# Patient Record
Sex: Female | Born: 1955 | Race: Black or African American | Hispanic: No | Smoking: Former smoker
Health system: Southern US, Community
[De-identification: ages and names within clinical notes are randomized; demographics above are authoritative.]

## PROBLEM LIST (undated history)

## (undated) DIAGNOSIS — E785 Hyperlipidemia, unspecified: Secondary | ICD-10-CM

## (undated) DIAGNOSIS — G4730 Sleep apnea, unspecified: Secondary | ICD-10-CM

## (undated) DIAGNOSIS — I252 Old myocardial infarction: Secondary | ICD-10-CM

## (undated) DIAGNOSIS — E669 Obesity, unspecified: Secondary | ICD-10-CM

## (undated) DIAGNOSIS — I219 Acute myocardial infarction, unspecified: Secondary | ICD-10-CM

## (undated) DIAGNOSIS — E559 Vitamin D deficiency, unspecified: Secondary | ICD-10-CM

## (undated) DIAGNOSIS — E8940 Asymptomatic postprocedural ovarian failure: Secondary | ICD-10-CM

## (undated) DIAGNOSIS — M1990 Unspecified osteoarthritis, unspecified site: Secondary | ICD-10-CM

## (undated) DIAGNOSIS — I2510 Atherosclerotic heart disease of native coronary artery without angina pectoris: Secondary | ICD-10-CM

## (undated) DIAGNOSIS — I10 Essential (primary) hypertension: Secondary | ICD-10-CM

## (undated) DIAGNOSIS — M549 Dorsalgia, unspecified: Secondary | ICD-10-CM

## (undated) DIAGNOSIS — E119 Type 2 diabetes mellitus without complications: Secondary | ICD-10-CM

## (undated) HISTORY — DX: Old myocardial infarction: I25.2

## (undated) HISTORY — DX: Vitamin D deficiency, unspecified: E55.9

## (undated) HISTORY — DX: Sleep apnea, unspecified: G47.30

## (undated) HISTORY — DX: Type 2 diabetes mellitus without complications: E11.9

## (undated) HISTORY — DX: Atherosclerotic heart disease of native coronary artery without angina pectoris: I25.10

## (undated) HISTORY — DX: Dorsalgia, unspecified: M54.9

## (undated) HISTORY — DX: Asymptomatic postprocedural ovarian failure: E89.40

## (undated) HISTORY — PX: ABDOMINAL HYSTERECTOMY: SHX81

## (undated) HISTORY — DX: Obesity, unspecified: E66.9

## (undated) HISTORY — DX: Hyperlipidemia, unspecified: E78.5

## (undated) HISTORY — PX: COLONOSCOPY: SHX174

---

## 2000-10-29 HISTORY — PX: CORONARY ANGIOPLASTY: SHX604

## 2005-06-07 ENCOUNTER — Ambulatory Visit: Payer: Self-pay | Admitting: Unknown Physician Specialty

## 2005-06-14 ENCOUNTER — Ambulatory Visit: Payer: Self-pay

## 2006-04-25 ENCOUNTER — Ambulatory Visit: Payer: Self-pay | Admitting: Family Medicine

## 2007-04-28 ENCOUNTER — Ambulatory Visit: Payer: Self-pay | Admitting: Family Medicine

## 2008-05-20 ENCOUNTER — Ambulatory Visit: Payer: Self-pay | Admitting: Family Medicine

## 2009-01-27 LAB — HM DEXA SCAN: HM Dexa Scan: NORMAL

## 2009-05-31 ENCOUNTER — Ambulatory Visit: Payer: Self-pay | Admitting: Family Medicine

## 2010-04-10 ENCOUNTER — Ambulatory Visit: Payer: Self-pay | Admitting: Family Medicine

## 2010-08-19 ENCOUNTER — Emergency Department: Payer: Self-pay | Admitting: Emergency Medicine

## 2012-10-30 ENCOUNTER — Ambulatory Visit: Payer: Self-pay | Admitting: Family Medicine

## 2013-01-23 ENCOUNTER — Ambulatory Visit: Payer: Self-pay | Admitting: Family Medicine

## 2013-01-23 DIAGNOSIS — G478 Other sleep disorders: Secondary | ICD-10-CM | POA: Insufficient documentation

## 2013-06-10 LAB — LIPID PANEL
CHOLESTEROL: 140 mg/dL (ref 0–200)
HDL: 66 mg/dL (ref 35–70)
LDL Cholesterol: 45 mg/dL
Triglycerides: 146 mg/dL (ref 40–160)

## 2013-12-02 ENCOUNTER — Ambulatory Visit: Payer: Self-pay | Admitting: Gastroenterology

## 2013-12-22 LAB — HM COLONOSCOPY: HM Colonoscopy: NORMAL

## 2014-04-21 ENCOUNTER — Ambulatory Visit: Payer: Self-pay | Admitting: Family Medicine

## 2014-04-21 LAB — HM MAMMOGRAPHY: HM Mammogram: NORMAL

## 2014-05-13 DIAGNOSIS — R001 Bradycardia, unspecified: Secondary | ICD-10-CM | POA: Insufficient documentation

## 2014-05-13 DIAGNOSIS — I10 Essential (primary) hypertension: Secondary | ICD-10-CM | POA: Insufficient documentation

## 2014-05-13 DIAGNOSIS — I2510 Atherosclerotic heart disease of native coronary artery without angina pectoris: Secondary | ICD-10-CM | POA: Insufficient documentation

## 2014-12-30 LAB — HEMOGLOBIN A1C: HEMOGLOBIN A1C: 6.4 % — AB (ref 4.0–6.0)

## 2015-02-05 ENCOUNTER — Emergency Department
Admit: 2015-02-05 | Disposition: A | Discharge status: Home / Self Care | Payer: Self-pay | Admitting: Emergency Medicine

## 2015-02-05 LAB — URINALYSIS, COMPLETE
Bacteria: NONE SEEN
Bilirubin,UR: NEGATIVE
Blood: NEGATIVE
Glucose,UR: NEGATIVE mg/dL (ref 0–75)
Ketone: NEGATIVE
LEUKOCYTE ESTERASE: NEGATIVE
Nitrite: NEGATIVE
PH: 6 (ref 4.5–8.0)
Protein: NEGATIVE
Specific Gravity: 1.012 (ref 1.003–1.030)

## 2015-02-05 LAB — COMPREHENSIVE METABOLIC PANEL
ALBUMIN: 4.4 g/dL
AST: 20 U/L
Alkaline Phosphatase: 70 U/L
Anion Gap: 8 (ref 7–16)
BUN: 19 mg/dL
Bilirubin,Total: 0.4 mg/dL
CALCIUM: 9.3 mg/dL
CO2: 26 mmol/L
Chloride: 106 mmol/L
Creatinine: 1.04 mg/dL — ABNORMAL HIGH
EGFR (African American): >60
GFR CALC NON AF AMER: 58 — AB
GLUCOSE: 140 mg/dL — AB
Potassium: 3.9 mmol/L
SGPT (ALT): 15 U/L
SODIUM: 140 mmol/L
Total Protein: 7.8 g/dL

## 2015-02-05 LAB — CBC WITH DIFFERENTIAL/PLATELET
BASOS PCT: 0.5 %
Basophil #: 0.1 10*3/uL (ref 0.0–0.1)
EOS PCT: 0.3 %
Eosinophil #: 0 10*3/uL (ref 0.0–0.7)
HCT: 38.2 % (ref 35.0–47.0)
HGB: 12.2 g/dL (ref 12.0–16.0)
LYMPHS PCT: 13.2 %
Lymphocyte #: 1.7 10*3/uL (ref 1.0–3.6)
MCH: 26.5 pg (ref 26.0–34.0)
MCHC: 32 g/dL (ref 32.0–36.0)
MCV: 83 fL (ref 80–100)
MONO ABS: 0.3 x10 3/mm (ref 0.2–0.9)
Monocyte %: 2.5 %
NEUTROS PCT: 83.5 %
Neutrophil #: 10.7 10*3/uL — ABNORMAL HIGH (ref 1.4–6.5)
PLATELETS: 249 10*3/uL (ref 150–440)
RBC: 4.62 10*6/uL (ref 3.80–5.20)
RDW: 14.3 % (ref 11.5–14.5)
WBC: 12.8 10*3/uL — ABNORMAL HIGH (ref 3.6–11.0)

## 2015-02-05 LAB — TROPONIN I: Troponin-I: <0.03 ng/mL

## 2015-07-12 ENCOUNTER — Other Ambulatory Visit: Payer: Self-pay | Admitting: Family Medicine

## 2015-07-12 DIAGNOSIS — Z1231 Encounter for screening mammogram for malignant neoplasm of breast: Secondary | ICD-10-CM

## 2015-07-19 ENCOUNTER — Ambulatory Visit
Admission: RE | Admit: 2015-07-19 | Discharge: 2015-07-19 | Disposition: A | Discharge status: Home / Self Care | Payer: PRIVATE HEALTH INSURANCE | Source: Ambulatory Visit | Attending: Family Medicine | Admitting: Family Medicine

## 2015-07-19 DIAGNOSIS — Z1231 Encounter for screening mammogram for malignant neoplasm of breast: Secondary | ICD-10-CM | POA: Insufficient documentation

## 2015-09-07 ENCOUNTER — Ambulatory Visit (INDEPENDENT_AMBULATORY_CARE_PROVIDER_SITE_OTHER): Payer: PRIVATE HEALTH INSURANCE | Admitting: Family Medicine

## 2015-09-07 ENCOUNTER — Encounter: Payer: Self-pay | Admitting: Family Medicine

## 2015-09-07 VITALS — BP 132/86 | HR 98 | Temp 98.7°F | Resp 14 | Ht 62.0 in | Wt 241.3 lb

## 2015-09-07 DIAGNOSIS — Z79899 Other long term (current) drug therapy: Secondary | ICD-10-CM

## 2015-09-07 DIAGNOSIS — I252 Old myocardial infarction: Secondary | ICD-10-CM | POA: Insufficient documentation

## 2015-09-07 DIAGNOSIS — Z90710 Acquired absence of both cervix and uterus: Secondary | ICD-10-CM | POA: Insufficient documentation

## 2015-09-07 DIAGNOSIS — E559 Vitamin D deficiency, unspecified: Secondary | ICD-10-CM

## 2015-09-07 DIAGNOSIS — Z9861 Coronary angioplasty status: Secondary | ICD-10-CM | POA: Diagnosis not present

## 2015-09-07 DIAGNOSIS — Z01419 Encounter for gynecological examination (general) (routine) without abnormal findings: Secondary | ICD-10-CM

## 2015-09-07 DIAGNOSIS — Z0000 Encounter for general adult medical examination without abnormal findings: Secondary | ICD-10-CM | POA: Diagnosis not present

## 2015-09-07 DIAGNOSIS — E669 Obesity, unspecified: Secondary | ICD-10-CM | POA: Diagnosis not present

## 2015-09-07 DIAGNOSIS — I2510 Atherosclerotic heart disease of native coronary artery without angina pectoris: Secondary | ICD-10-CM | POA: Diagnosis not present

## 2015-09-07 DIAGNOSIS — G478 Other sleep disorders: Secondary | ICD-10-CM | POA: Diagnosis not present

## 2015-09-07 DIAGNOSIS — E785 Hyperlipidemia, unspecified: Secondary | ICD-10-CM | POA: Insufficient documentation

## 2015-09-07 DIAGNOSIS — E1169 Type 2 diabetes mellitus with other specified complication: Secondary | ICD-10-CM | POA: Insufficient documentation

## 2015-09-07 DIAGNOSIS — E119 Type 2 diabetes mellitus without complications: Secondary | ICD-10-CM

## 2015-09-07 DIAGNOSIS — Z23 Encounter for immunization: Secondary | ICD-10-CM | POA: Diagnosis not present

## 2015-09-07 MED ORDER — METFORMIN HCL 500 MG PO TABS: 500.0000 mg | ORAL_TABLET | Freq: Every day | ORAL | Status: DC

## 2015-09-07 MED ORDER — LISINOPRIL 10 MG PO TABS: 10.0000 mg | ORAL_TABLET | Freq: Every day | ORAL | Status: DC

## 2015-09-07 MED ORDER — SIMVASTATIN 40 MG PO TABS: 40.0000 mg | ORAL_TABLET | Freq: Every day | ORAL | Status: DC

## 2015-09-07 NOTE — Progress Notes (Signed)
Name: Kathy BarriosShelva L Price   MRN: [id number]    DOB: [birth year]   Date:09/07/2015       Progress Note  Subjective  Chief Complaint  Chief Complaint  Patient presents with  . Annual Exam    HPI  Well woman : married, s/p hysterectomy for benign disease, no vaginal dryness, no dyspareunia, no incontinence.   DMII: last visit was 12/2014, she has been taking Metformin, no side effects of medication. Fsbs has been around 112-123, she has occasional polyphagia and polydipsia and polyuria. Not up to date with eye exam, but she will schedule it.  CAD: s/p MI and stent placement 08/2001, she needs to follow up with Dr. Gwen PoundsKowalski. She has been taking apirin 81 mg daily also lisinopril. Not on beta-blocker, she had an episode of bradycardia in the past. No recent chest pain. States has occasionally a sharp pain for about one minute that resolves by itself. Not associated with SOB or diaphoresis.  Morbid Obesity: weight has been stable, she states that she has been working long hours, but is trying to incorporate some walking and do some home exercises. She likes sweets and has been trying to cut down. Eating more vegetables and baked food.  Hyperlipidemia: taking Simvastatin , denies side effects  UARS: found on Sleep Study, she did not get referral to ENT  Patient Active Problem List   Diagnosis Date Noted  . CAD S/P percutaneous coronary angioplasty 09/07/2015  . Diabetes mellitus type 2 in obese (HCC) 09/07/2015  . Dyslipidemia 09/07/2015  . H/O: hysterectomy 09/07/2015  . H/O acute myocardial infarction 09/07/2015  . Vitamin D deficiency 09/07/2015  . Morbid obesity (HCC) 09/07/2015  . Bradycardia 05/13/2014  . Arteriosclerosis of coronary artery 05/13/2014  . Essential (primary) hypertension 05/13/2014  . UARS (upper airway resistance syndrome) 01/23/2013    Past Surgical History  Procedure Laterality Date  . Abdominal hysterectomy      Family History  Problem Relation Age of Onset   . Breast cancer Sister 2950  . Diabetes Mother   . Hypertension Mother   . Cancer Father   . CAD Brother     Social History   Social History  . Marital Status: Married    Spouse Name: N/A  . Number of Children: N/A  . Years of Education: N/A   Occupational History  . Not on file.   Social History Main Topics  . Smoking status: Former Smoker -- 1.00 packs/day for 25 years    Types: Cigarettes    Quit date: 09/06/2000  . Smokeless tobacco: Not on file  . Alcohol Use: 0.0 oz/week    0 Standard drinks or equivalent per week  . Drug Use: No  . Sexual Activity: Yes   Other Topics Concern  . Not on file   Social History Narrative     Current outpatient prescriptions:  .  Cholecalciferol (VITAMIN D3) 2000 UNITS capsule, Take 1 capsule by mouth daily., Disp: , Rfl:  .  metFORMIN (GLUCOPHAGE) 500 MG tablet, Take 1 tablet (500 mg total) by mouth daily., Disp: 30 tablet, Rfl: 5 .  simvastatin (ZOCOR) 40 MG tablet, Take 1 tablet (40 mg total) by mouth daily., Disp: 30 tablet, Rfl: 5 .  aspirin EC 81 MG tablet, Take 1 tablet by mouth daily., Disp: , Rfl:  .  lisinopril (PRINIVIL,ZESTRIL) 10 MG tablet, Take 1 tablet (10 mg total) by mouth daily., Disp: 30 tablet, Rfl: 5  Allergies  Allergen Reactions  . Atorvastatin   .  Metoprolol     severe bradycardia  . Sulfa Antibiotics Rash and Hives     ROS  Constitutional: Negative for fever or weight change.  Respiratory: Negative for cough and shortness of breath.   Cardiovascular: Negative for chest pain or palpitations.  Gastrointestinal: Negative for abdominal pain, no bowel changes.  Musculoskeletal: Negative for gait problem or joint swelling.  Skin: Negative for rash.  Neurological: Negative for dizziness or headache.  No other specific complaints in a complete review of systems (except as listed in HPI above).  Objective  Filed Vitals:   09/07/15 0857  BP: 132/86  Pulse: 98  Temp: 98.7 F (37.1 C)  TempSrc: Oral   Resp: 14  Height:  (1.575 m)  Weight: 241 lb 4.8 oz (109.453 kg)  SpO2: 99%    Body mass index is 44.12 kg/(m^2).  Physical Exam   Constitutional: Patient appears well-developed and obese. No distress.  HENT: Head: Normocephalic and atraumatic. Ears: B TMs ok, no erythema or effusion; Nose: Nose normal. Mouth/Throat: Oropharynx is clear and moist. No oropharyngeal exudate.  Eyes: Conjunctivae and EOM are normal. Pupils are equal, round, and reactive to light. No scleral icterus.  Neck: Normal range of motion. Neck supple. No JVD present. No thyromegaly present.  Cardiovascular: Normal rate, regular rhythm and normal heart sounds.  No murmur heard. No BLE edema. Pulmonary/Chest: Effort normal and breath sounds normal. No respiratory distress. Abdominal: Soft. Bowel sounds are normal, no distension. There is no tenderness. no masses Breast: no lumps or masses, no nipple discharge or rashes FEMALE GENITALIA:  External genitalia normal External urethra normal Vaginal vault normal without discharge or lesions Cervix absent s/p hysterectomy Bimanual exam normal without masses RECTAL: normal external exam Musculoskeletal: Normal range of motion, no joint effusions. No gross deformities Neurological: he is alert and oriented to person, place, and time. No cranial nerve deficit. Coordination, balance, strength, speech and gait are normal.  Skin: Skin is warm and dry. No rash noted. No erythema.  Psychiatric: Patient has a normal mood and affect. behavior is normal. Judgment and thought content normal.  Diabetic Foot Exam: Diabetic Foot Exam - Simple   Simple Foot Form  Diabetic Foot exam was performed with the following findings:  Yes 09/07/2015  9:13 AM  Visual Inspection  No deformities, no ulcerations, no other skin breakdown bilaterally:  Yes  Sensation Testing  Intact to touch and monofilament testing bilaterally:  Yes  Pulse Check  Posterior Tibialis and Dorsalis pulse  intact bilaterally:  Yes  Comments       PHQ2/9: Depression screen PHQ 2/9 09/07/2015  Decreased Interest 0  Down, Depressed, Hopeless 0  PHQ - 2 Score 0     Fall Risk: Fall Risk  09/07/2015  Falls in the past year? No     Functional Status Survey: Is the patient deaf or have difficulty hearing?: No Does the patient have difficulty seeing, even when wearing glasses/contacts?: No Does the patient have difficulty concentrating, remembering, or making decisions?: No Does the patient have difficulty walking or climbing stairs?: No Does the patient have difficulty dressing or bathing?: No Does the patient have difficulty doing errands alone such as visiting a doctor's office or shopping?: No    Assessment & Plan  1. Well woman exam  Discussed importance of 150 minutes of physical activity weekly, eat two servings of fish weekly, eat one serving of tree nuts ( cashews, pistachios, pecans, almonds.Marland Kitchen) every other day, eat 6 servings of fruit/vegetables daily and  drink plenty of water and avoid sweet beverages.   2. Morbid obesity due to excess calories [facility])  Discussed with the patient the risk posed by an increased BMI. Discussed importance of portion control, calorie counting and at least 150 minutes of physical activity weekly. Avoid sweet beverages and drink more water. Eat at least 6 servings of fruit and vegetables daily   3. Vitamin D deficiency  - Vit D  25 hydroxy (rtn osteoporosis monitoring)  4. Dyslipidemia  - Lipid panel - simvastatin (ZOCOR) 40 MG tablet; Take 1 tablet (40 mg total) by mouth daily.  Dispense: 30 tablet; Refill: 5  5. Diabetes mellitus type 2 in obese [facility])  Explained importance of regular follow ups - Hemoglobin A1c - metFORMIN (GLUCOPHAGE) 500 MG tablet; Take 1 tablet (500 mg total) by mouth daily.  Dispense: 30 tablet; Refill: 5 - lisinopril (PRINIVIL,ZESTRIL) 10 MG tablet; Take 1 tablet (10 mg total) by mouth daily.  Dispense: 30 tablet;  Refill: 5  6. UARS (upper airway resistance syndrome)  - Ambulatory referral to ENT  7. Need for shingles vaccine  - Varicella-zoster vaccine subcutaneous - refused she will check coverage with insurance  8. Need for pneumococcal vaccination  - Pneumococcal polysaccharide vaccine 23-valent greater than or equal to 2yo subcutaneous/IM  9. Long-term use of high-risk medication  - Comprehensive metabolic panel  10. CAD S/P percutaneous coronary angioplasty  - lisinopril (PRINIVIL,ZESTRIL) 10 MG tablet; Take 1 tablet (10 mg total) by mouth daily.  Dispense: 30 tablet; Refill: 5

## 2015-09-09 ENCOUNTER — Other Ambulatory Visit
Admission: RE | Admit: 2015-09-09 | Discharge: 2015-09-09 | Disposition: A | Discharge status: Home / Self Care | Payer: PRIVATE HEALTH INSURANCE | Source: Ambulatory Visit | Attending: Family Medicine | Admitting: Family Medicine

## 2015-09-09 DIAGNOSIS — E785 Hyperlipidemia, unspecified: Secondary | ICD-10-CM | POA: Diagnosis not present

## 2015-09-09 DIAGNOSIS — Z79899 Other long term (current) drug therapy: Secondary | ICD-10-CM | POA: Diagnosis not present

## 2015-09-09 DIAGNOSIS — E119 Type 2 diabetes mellitus without complications: Secondary | ICD-10-CM | POA: Diagnosis not present

## 2015-09-09 DIAGNOSIS — E669 Obesity, unspecified: Secondary | ICD-10-CM | POA: Diagnosis not present

## 2015-09-09 DIAGNOSIS — E559 Vitamin D deficiency, unspecified: Secondary | ICD-10-CM | POA: Diagnosis not present

## 2015-09-09 LAB — HEMOGLOBIN A1C: Hgb A1c MFr Bld: 6 % (ref 4.0–6.0)

## 2015-09-09 LAB — COMPREHENSIVE METABOLIC PANEL
ALT: 13 U/L — ABNORMAL LOW (ref 14–54)
AST: 17 U/L (ref 15–41)
Albumin: 4.2 g/dL (ref 3.5–5.0)
Alkaline Phosphatase: 74 U/L (ref 38–126)
Anion gap: 6 (ref 5–15)
BUN: 16 mg/dL (ref 6–20)
CO2: 27 mmol/L (ref 22–32)
Calcium: 9.6 mg/dL (ref 8.9–10.3)
Chloride: 107 mmol/L (ref 101–111)
Creatinine, Ser: 0.86 mg/dL (ref 0.44–1.00)
GFR calc Af Amer: >60 mL/min (ref >60)
Glucose, Bld: 107 mg/dL — ABNORMAL HIGH (ref 65–99)
POTASSIUM: 4.2 mmol/L (ref 3.5–5.1)
Sodium: 140 mmol/L (ref 135–145)
Total Bilirubin: 0.3 mg/dL (ref 0.3–1.2)
Total Protein: 8 g/dL (ref 6.5–8.1)

## 2015-09-09 LAB — LIPID PANEL
Cholesterol: 156 mg/dL (ref 0–200)
HDL: 50 mg/dL (ref >40)
LDL CALC: 73 mg/dL (ref 0–99)
TRIGLYCERIDES: 167 mg/dL — AB (ref <150)
Total CHOL/HDL Ratio: 3.1 RATIO
VLDL: 33 mg/dL (ref 0–40)

## 2015-09-10 LAB — VITAMIN D 25 HYDROXY (VIT D DEFICIENCY, FRACTURES): VIT D 25 HYDROXY: 13.6 ng/mL — AB (ref 30.0–100.0)

## 2015-09-11 ENCOUNTER — Other Ambulatory Visit: Payer: Self-pay | Admitting: Family Medicine

## 2015-09-11 MED ORDER — VITAMIN D (ERGOCALCIFEROL) 1.25 MG (50000 UNIT) PO CAPS: 50000.0000 [IU] | ORAL_CAPSULE | ORAL | Status: DC

## 2016-01-30 DIAGNOSIS — I255 Ischemic cardiomyopathy: Secondary | ICD-10-CM | POA: Insufficient documentation

## 2016-01-30 DIAGNOSIS — I420 Dilated cardiomyopathy: Secondary | ICD-10-CM | POA: Insufficient documentation

## 2016-03-07 ENCOUNTER — Ambulatory Visit (INDEPENDENT_AMBULATORY_CARE_PROVIDER_SITE_OTHER): Payer: BLUE CROSS/BLUE SHIELD | Admitting: Family Medicine

## 2016-03-07 ENCOUNTER — Encounter: Payer: Self-pay | Admitting: Family Medicine

## 2016-03-07 VITALS — BP 122/78 | HR 83 | Temp 98.6°F | Resp 16 | Wt 242.8 lb

## 2016-03-07 DIAGNOSIS — Z23 Encounter for immunization: Secondary | ICD-10-CM | POA: Diagnosis not present

## 2016-03-07 DIAGNOSIS — E559 Vitamin D deficiency, unspecified: Secondary | ICD-10-CM

## 2016-03-07 DIAGNOSIS — E785 Hyperlipidemia, unspecified: Secondary | ICD-10-CM | POA: Diagnosis not present

## 2016-03-07 DIAGNOSIS — I2510 Atherosclerotic heart disease of native coronary artery without angina pectoris: Secondary | ICD-10-CM

## 2016-03-07 DIAGNOSIS — E669 Obesity, unspecified: Secondary | ICD-10-CM | POA: Diagnosis not present

## 2016-03-07 DIAGNOSIS — I10 Essential (primary) hypertension: Secondary | ICD-10-CM | POA: Diagnosis not present

## 2016-03-07 DIAGNOSIS — Z9861 Coronary angioplasty status: Secondary | ICD-10-CM

## 2016-03-07 DIAGNOSIS — E1169 Type 2 diabetes mellitus with other specified complication: Secondary | ICD-10-CM

## 2016-03-07 DIAGNOSIS — G478 Other sleep disorders: Secondary | ICD-10-CM | POA: Diagnosis not present

## 2016-03-07 DIAGNOSIS — E119 Type 2 diabetes mellitus without complications: Secondary | ICD-10-CM

## 2016-03-07 LAB — POCT UA - MICROALBUMIN: MICROALBUMIN (UR) POC: 20 mg/L

## 2016-03-07 LAB — POCT GLYCOSYLATED HEMOGLOBIN (HGB A1C): Hemoglobin A1C: 6.7

## 2016-03-07 MED ORDER — LISINOPRIL 10 MG PO TABS: 10.0000 mg | ORAL_TABLET | Freq: Every day | ORAL | Status: DC

## 2016-03-07 MED ORDER — SIMVASTATIN 40 MG PO TABS: 40.0000 mg | ORAL_TABLET | Freq: Every day | ORAL | Status: DC

## 2016-03-07 MED ORDER — FLUCONAZOLE 150 MG PO TABS: 150.0000 mg | ORAL_TABLET | ORAL | Status: DC

## 2016-03-07 MED ORDER — DAPAGLIFLOZIN PRO-METFORMIN ER 5-500 MG PO TB24: 1.0000 | ORAL_TABLET | Freq: Every day | ORAL | Status: DC

## 2016-03-07 NOTE — Progress Notes (Signed)
Name: Kathy BarriosShelva L Price   MRN: [id number]    DOB: 1956-10-07   Date:03/07/2016       Progress Note  Subjective  Chief Complaint  Chief Complaint  Patient presents with  . Follow-up    patient is here for her 6129-month f/u    HPI  DMII: she has been taking Metformin, no side effects of medication, but hgbA1C has gone up from 6.0 to 6.7% . Fsbs has been around 100's , she has occasional polyphagia ( craving sweets ) she denies  polydipsia and polyuria. [facility]. No paresthesia, urine micro within normal limits  CAD: s/p MI and stent placement 08/2001, recently seen by Dr. Gwen PoundsKowalski and has ischemic cardiomyopathy with left infection wall hypokinesis. Stress test only showed old lesion, no acute ischemia   She has been taking apirin 81 mg daily also lisinopril. Not on beta-blocker, she had an episode of bradycardia in the past. No recent chest pain.  Not associated with SOB or diaphoresis. No orthopenea  Morbid Obesity: weight has been stable, she states that she has been working long hours, but is trying to incorporate some walking and do some home exercises. She likes sweets and has been trying to cut down. Eating more vegetables and baked food.  Hyperlipidemia: taking Simvastatin , denies side effects, last LDL was at goal, can't tolerate Lipitor, and she does not want to switch to Crestor because of cost  UARS: found on Sleep Study, she did not get referral to ENT    Patient Active Problem List   Diagnosis Date Noted  . Cardiomyopathy, ischemic 01/30/2016  . CAD S/P percutaneous coronary angioplasty 09/07/2015  . Diabetes mellitus type 2 in obese (HCC) 09/07/2015  . Dyslipidemia 09/07/2015  . H/O: hysterectomy 09/07/2015  . H/O acute myocardial infarction 09/07/2015  . Vitamin D deficiency 09/07/2015  . Morbid obesity (HCC) 09/07/2015  . Bradycardia 05/13/2014  . Arteriosclerosis of coronary artery 05/13/2014  . Essential (primary) hypertension  05/13/2014  . UARS (upper airway resistance syndrome) 01/23/2013    Past Surgical History  Procedure Laterality Date  . Abdominal hysterectomy      Family History  Problem Relation Age of Onset  . Breast cancer Sister 6750  . Diabetes Mother   . Hypertension Mother   . Cancer Father   . CAD Brother     Social History   Social History  . Marital Status: Married    Spouse Name: N/A  . Number of Children: N/A  . Years of Education: N/A   Occupational History  . Not on file.   Social History Main Topics  . Smoking status: Former Smoker -- 1.00 packs/day for 25 years    Types: Cigarettes    Quit date: 09/06/2000  . Smokeless tobacco: Not on file  . Alcohol Use: 0.0 oz/week    0 Standard drinks or equivalent per week  . Drug Use: No  . Sexual Activity: Yes   Other Topics Concern  . Not on file   Social History Narrative     Current outpatient prescriptions:  .  aspirin EC 81 MG tablet, Take 1 tablet by mouth daily., Disp: , Rfl:  .  Cholecalciferol (VITAMIN D3) 2000 UNITS capsule, Take 1 capsule by mouth daily., Disp: , Rfl:  .  lisinopril (PRINIVIL,ZESTRIL) 10 MG tablet, Take 1 tablet (10 mg total) by mouth daily., Disp: 90 tablet, Rfl: 1 .  Multiple Vitamins-Minerals (WOMENS MULTIVITAMIN PO), Take by mouth., Disp: , Rfl:  .  simvastatin (ZOCOR) 40 MG tablet, Take 1 tablet (40 mg total) by mouth daily., Disp: 90 tablet, Rfl: 1 .  Dapagliflozin-Metformin HCl ER (XIGDUO XR) 5-500 MG TB24, Take 1 tablet by mouth daily., Disp: 30 tablet, Rfl: 2  Allergies  Allergen Reactions  . Atorvastatin   . Metoprolol     severe bradycardia  . Sulfa Antibiotics Rash and Hives     ROS  Constitutional: Negative for fever or weight change.  Respiratory: Negative for cough and shortness of breath.   Cardiovascular: Negative for chest pain or palpitations.  Gastrointestinal: Negative for abdominal pain, no bowel changes.  Musculoskeletal: Negative for gait problem or joint  swelling.  Skin: Negative for rash.  Neurological: Negative for dizziness or headache.  No other specific complaints in a complete review of systems (except as listed in HPI above).  Objective  Filed Vitals:   03/07/16 0802  BP: 122/78  Pulse: 83  Temp: 98.6 F (37 C)  TempSrc: Oral  Resp: 16  Weight: 242 lb 12.8 oz (110.133 kg)  SpO2: 96%    Body mass index is 44.4 kg/(m^2).  Physical Exam  Constitutional: Patient appears well-developed and well-nourished. Obese No distress.  HEENT: head atraumatic, normocephalic, pupils equal and reactive to light, neck supple, throat within normal limits Cardiovascular: Normal rate, regular rhythm and normal heart sounds.  No murmur heard. No BLE edema. Pulmonary/Chest: Effort normal and breath sounds normal. No respiratory distress. Abdominal: Soft.  There is no tenderness. Psychiatric: Patient has a normal mood and affect. behavior is normal. Judgment and thought content normal.  Recent Results (from the past 2160 hour(s))  POCT glycosylated hemoglobin (Hb A1C)     Status: Abnormal   Collection Time: 03/07/16  8:08 AM  Result Value Ref Range   Hemoglobin A1C 6.7   POCT UA - Microalbumin     Status: Abnormal   Collection Time: 03/07/16  8:08 AM  Result Value Ref Range   Microalbumin Ur, POC 20 mg/L   Creatinine, POC  mg/dL   Albumin/Creatinine Ratio, Urine, POC       PHQ2/9: Depression screen [facility] 2/9 03/07/2016 09/07/2015  Decreased Interest 0 0  Down, Depressed, Hopeless 0 0  PHQ - 2 Score 0 0    Fall Risk: Fall Risk  03/07/2016 09/07/2015  Falls in the past year? No No     Functional Status Survey: Is the patient deaf or have difficulty hearing?: No Does the patient have difficulty seeing, even when wearing glasses/contacts?: No Does the patient have difficulty concentrating, remembering, or making decisions?: No Does the patient have difficulty walking or climbing stairs?: No Does the patient have difficulty dressing  or bathing?: No Does the patient have difficulty doing errands alone such as visiting a doctor's office or shopping?: No    Assessment & Plan  1. Diabetes mellitus type 2 in obese (HCC)  - POCT glycosylated hemoglobin (Hb A1C) - POCT UA - Microalbumin - Dapagliflozin-Metformin HCl ER (XIGDUO XR) 5-500 MG TB24; Take 1 tablet by mouth daily.  Dispense: 30 tablet; Refill: 2  2. Dyslipidemia  - simvastatin (ZOCOR) 40 MG tablet; Take 1 tablet (40 mg total) by mouth daily.  Dispense: 90 tablet; Refill: 1  3. Vitamin D deficiency  She needs to continue taking Vitamin D  4. Need for shingles vaccine  - Varicella-zoster vaccine subcutaneous - not given : out of stock  5. Morbid obesity due to excess calories [facility])  Discussed with the patient the risk posed by an increased  BMI. Discussed importance of portion control, calorie counting and at least 150 minutes of physical activity weekly. Avoid sweet beverages and drink more water. Eat at least 6 servings of fruit and vegetables daily   6. Essential (primary) hypertension  - lisinopril (PRINIVIL,ZESTRIL) 10 MG tablet; Take 1 tablet (10 mg total) by mouth daily.  Dispense: 90 tablet; Refill: 1  7. CAD S/P percutaneous coronary angioplasty  - lisinopril (PRINIVIL,ZESTRIL) 10 MG tablet; Take 1 tablet (10 mg total) by mouth daily.  Dispense: 90 tablet; Refill: 1  8. UARS (upper airway resistance syndrome)  - Ambulatory referral to ENT

## 2016-03-07 NOTE — Patient Instructions (Signed)

## 2016-03-23 ENCOUNTER — Encounter: Payer: Self-pay | Admitting: Family Medicine

## 2016-03-27 ENCOUNTER — Other Ambulatory Visit: Payer: Self-pay | Admitting: Family Medicine

## 2016-05-15 ENCOUNTER — Emergency Department
Admission: EM | Admit: 2016-05-15 | Discharge: 2016-05-15 | Disposition: A | Discharge status: Home / Self Care | Payer: BLUE CROSS/BLUE SHIELD | Attending: Emergency Medicine | Admitting: Emergency Medicine

## 2016-05-15 ENCOUNTER — Encounter: Payer: Self-pay | Admitting: Emergency Medicine

## 2016-05-15 DIAGNOSIS — I2510 Atherosclerotic heart disease of native coronary artery without angina pectoris: Secondary | ICD-10-CM | POA: Diagnosis not present

## 2016-05-15 DIAGNOSIS — R112 Nausea with vomiting, unspecified: Secondary | ICD-10-CM | POA: Diagnosis not present

## 2016-05-15 DIAGNOSIS — Z7982 Long term (current) use of aspirin: Secondary | ICD-10-CM | POA: Diagnosis not present

## 2016-05-15 DIAGNOSIS — R61 Generalized hyperhidrosis: Secondary | ICD-10-CM | POA: Insufficient documentation

## 2016-05-15 DIAGNOSIS — I255 Ischemic cardiomyopathy: Secondary | ICD-10-CM | POA: Insufficient documentation

## 2016-05-15 DIAGNOSIS — R1013 Epigastric pain: Secondary | ICD-10-CM

## 2016-05-15 DIAGNOSIS — E785 Hyperlipidemia, unspecified: Secondary | ICD-10-CM | POA: Insufficient documentation

## 2016-05-15 DIAGNOSIS — Z79899 Other long term (current) drug therapy: Secondary | ICD-10-CM | POA: Diagnosis not present

## 2016-05-15 DIAGNOSIS — Z87891 Personal history of nicotine dependence: Secondary | ICD-10-CM | POA: Diagnosis not present

## 2016-05-15 DIAGNOSIS — I252 Old myocardial infarction: Secondary | ICD-10-CM | POA: Insufficient documentation

## 2016-05-15 DIAGNOSIS — E119 Type 2 diabetes mellitus without complications: Secondary | ICD-10-CM | POA: Diagnosis not present

## 2016-05-15 LAB — URINALYSIS COMPLETE WITH MICROSCOPIC (ARMC ONLY)
Bilirubin Urine: NEGATIVE
Glucose, UA: >500 mg/dL — AB
KETONES UR: NEGATIVE mg/dL
Leukocytes, UA: NEGATIVE
NITRITE: NEGATIVE
PH: 5 (ref 5.0–8.0)
PROTEIN: NEGATIVE mg/dL
Specific Gravity, Urine: 1.011 (ref 1.005–1.030)

## 2016-05-15 LAB — CBC
HEMATOCRIT: 40.1 % (ref 35.0–47.0)
Hemoglobin: 13.6 g/dL (ref 12.0–16.0)
MCH: 27.7 pg (ref 26.0–34.0)
MCHC: 33.9 g/dL (ref 32.0–36.0)
MCV: 81.6 fL (ref 80.0–100.0)
Platelets: 233 10*3/uL (ref 150–440)
RBC: 4.92 MIL/uL (ref 3.80–5.20)
RDW: 14.9 % — AB (ref 11.5–14.5)
WBC: 7.8 10*3/uL (ref 3.6–11.0)

## 2016-05-15 LAB — COMPREHENSIVE METABOLIC PANEL
ALBUMIN: 4.4 g/dL (ref 3.5–5.0)
ALK PHOS: 65 U/L (ref 38–126)
ALT: 16 U/L (ref 14–54)
AST: 19 U/L (ref 15–41)
Anion gap: 8 (ref 5–15)
BUN: 17 mg/dL (ref 6–20)
CO2: 23 mmol/L (ref 22–32)
Calcium: 9.7 mg/dL (ref 8.9–10.3)
Chloride: 108 mmol/L (ref 101–111)
Creatinine, Ser: 0.98 mg/dL (ref 0.44–1.00)
GFR calc non Af Amer: >60 mL/min (ref >60)
GLUCOSE: 138 mg/dL — AB (ref 65–99)
POTASSIUM: 4.1 mmol/L (ref 3.5–5.1)
SODIUM: 139 mmol/L (ref 135–145)
TOTAL PROTEIN: 7.9 g/dL (ref 6.5–8.1)
Total Bilirubin: 0.4 mg/dL (ref 0.3–1.2)

## 2016-05-15 LAB — LIPASE, BLOOD: LIPASE: 25 U/L (ref 11–51)

## 2016-05-15 MED ORDER — ONDANSETRON HCL 4 MG/2ML IJ SOLN
4.0000 mg | Freq: Once | INTRAMUSCULAR | Status: AC
  Administered 2016-05-15: 4 mg via INTRAVENOUS
  Filled 2016-05-15: qty 2

## 2016-05-15 MED ORDER — OMEPRAZOLE 40 MG PO CPDR: 40.0000 mg | DELAYED_RELEASE_CAPSULE | Freq: Every day | ORAL | Status: DC

## 2016-05-15 MED ORDER — GI COCKTAIL ~~LOC~~
30.0000 mL | Freq: Once | ORAL | Status: AC
Start: 2016-05-15 — End: 2016-05-15
  Administered 2016-05-15: 30 mL via ORAL
  Filled 2016-05-15: qty 30

## 2016-05-15 MED ORDER — SODIUM CHLORIDE 0.9 % IV BOLUS (SEPSIS)
1000.0000 mL | Freq: Once | INTRAVENOUS | Status: AC
  Administered 2016-05-15: 1000 mL via INTRAVENOUS

## 2016-05-15 MED ORDER — ONDANSETRON HCL 4 MG/2ML IJ SOLN
INTRAMUSCULAR | Status: AC
  Administered 2016-05-15: 4 mg via INTRAVENOUS
  Filled 2016-05-15: qty 2

## 2016-05-15 MED ORDER — ONDANSETRON 4 MG PO TBDP: 4.0000 mg | ORAL_TABLET | Freq: Three times a day (TID) | ORAL | Status: DC | PRN

## 2016-05-15 NOTE — ED Notes (Signed)
Pt reports abdominal pain and vomiting today; reports hx of the same about 5 months ago.

## 2016-05-15 NOTE — Discharge Instructions (Signed)
Please follow the dietary recommendations in the attached paperwork. Start the omeprazole today and take as prescribed.  Please return to the emergency department if you develop chest pain, shortness of breath, palpitations, lightheadedness or fainting, severe pain, fever, inability to keep down fluids, or any other symptoms concerning to you.

## 2016-05-15 NOTE — ED Provider Notes (Signed)
[facility] Emergency Department Provider Note  ____________________________________________  Time seen: Approximately 12:51 PM  I have reviewed the triage vital signs and the nursing notes.   HISTORY  Chief Complaint Abdominal Pain    HPI Kathy Price is a 60 y.o. female with a remote history of GERD currently untreated, CAD status post MI, DM, HL, presenting with epigastric pain. The patient reports that she woke up at 4 AM with an epigastric burning sensation associated with diaphoresis and lightheadedness, and then had several episodes of nausea and vomiting. She denies any constipation or diarrhea and had a normal stool this morning. She has not had any fever or chills, chest pain, palpitations, lightheadedness or syncope. No shortness of breath. She was seen here 3-4 months ago with a similar episode, and did not have the opportunity to follow-up with her primary care physician or GI physician.   Past Medical History  Diagnosis Date  . Breathing-related sleep disorder   . History of MI (myocardial infarction)   . CAD (coronary artery disease)   . Diabetes mellitus without complication (HCC)   . Hyperlipidemia   . Back pain   . Postablative ovarian failure   . Vitamin D deficiency   . Obese     Patient Active Problem List   Diagnosis Date Noted  . Cardiomyopathy, ischemic 01/30/2016  . CAD S/P percutaneous coronary angioplasty 09/07/2015  . Diabetes mellitus type 2 in obese (HCC) 09/07/2015  . Dyslipidemia 09/07/2015  . H/O: hysterectomy 09/07/2015  . H/O acute myocardial infarction 09/07/2015  . Vitamin D deficiency 09/07/2015  . Morbid obesity (HCC) 09/07/2015  . Bradycardia 05/13/2014  . Arteriosclerosis of coronary artery 05/13/2014  . Essential (primary) hypertension 05/13/2014  . UARS (upper airway resistance syndrome) 01/23/2013    Past Surgical History  Procedure Laterality Date  . Abdominal hysterectomy      Current  Outpatient Rx  Name  Route  Sig  Dispense  Refill  . aspirin EC 81 MG tablet   Oral   Take 1 tablet by mouth daily.         . Cholecalciferol (VITAMIN D3) 2000 UNITS capsule   Oral   Take 1 capsule by mouth daily.         . Dapagliflozin-Metformin HCl ER (XIGDUO XR) 5-500 MG TB24   Oral   Take 1 tablet by mouth daily.   30 tablet   2   . lisinopril (PRINIVIL,ZESTRIL) 10 MG tablet   Oral   Take 1 tablet (10 mg total) by mouth daily.   90 tablet   1   . Multiple Vitamins-Minerals (WOMENS MULTIVITAMIN PO)   Oral   Take 1 tablet by mouth.          . simvastatin (ZOCOR) 40 MG tablet   Oral   Take 1 tablet (40 mg total) by mouth daily.   90 tablet   1   . omeprazole (PRILOSEC) 40 MG capsule   Oral   Take 1 capsule (40 mg total) by mouth daily.   30 capsule   0   . ondansetron (ZOFRAN ODT) 4 MG disintegrating tablet   Oral   Take 1 tablet (4 mg total) by mouth every 8 (eight) hours as needed for nausea or vomiting.   20 tablet   0     Allergies Metoprolol; Atorvastatin; and Sulfa antibiotics  Family History  Problem Relation Age of Onset  . Breast cancer Sister 86  . Diabetes Mother   . Hypertension  Mother   . Cancer Father   . CAD Brother     Social History Social History  Substance Use Topics  . Smoking status: Former Smoker -- 1.00 packs/day for 25 years    Types: Cigarettes    Quit date: 09/06/2000  . Smokeless tobacco: None  . Alcohol Use: 0.0 oz/week    0 Standard drinks or equivalent per week    Review of Systems Constitutional: No fever/chills.No lightheadedness or syncope. Eyes: No visual changes. ENT: No sore throat. No congestion or rhinorrhea. Cardiovascular: Denies chest pain. Denies palpitations. Respiratory: Denies shortness of breath.  No cough. Gastrointestinal: Positive epigastric abdominal pain.  Positive nausea, positive vomiting.  No diarrhea.  No constipation. Genitourinary: Negative for dysuria. Musculoskeletal:  Negative for back pain. Skin: Negative for rash. Neurological: Negative for headaches. No focal numbness, tingling or weakness.   10-point ROS otherwise negative.  ____________________________________________   PHYSICAL EXAM:  VITAL SIGNS: ED Triage Vitals  Enc Vitals Group     BP 05/15/16 0946 156/68 mmHg     Pulse Rate 05/15/16 0946 82     Resp 05/15/16 1030 21     Temp 05/15/16 0946 98.2 F (36.8 C)     Temp Source 05/15/16 0946 Oral     SpO2 05/15/16 0946 97 %     Weight 05/15/16 0946 238 lb (107.956 kg)     Height 05/15/16 0946 5\' 3"  (1.6 m)     Head Cir --      Peak Flow --      Pain Score 05/15/16 0959 6     Pain Loc --      Pain Edu? --      Excl. in GC? --     Constitutional: Alert and oriented. Well appearing and in no acute distress. Answers questions appropriately. Eyes: Conjunctivae are normal.  EOMI. No scleral icterus. Head: Atraumatic. Nose: No congestion/rhinnorhea. Mouth/Throat: Mucous membranes are moist.  Neck: No stridor.  Supple.  No JVD. Cardiovascular: Normal rate, regular rhythm. No murmurs, rubs or gallops.  Respiratory: Normal respiratory effort.  No accessory muscle use or retractions. Lungs CTAB.  No wheezes, rales or ronchi. Gastrointestinal: Obese. Soft and nondistended.  Reproducible tenderness to palpation in the epigastrium. Negative Murphy sign. No guarding or rebound.  No peritoneal signs. Musculoskeletal: No LE edema.  Neurologic:  A&Ox3.  Speech is clear.  Face and smile are symmetric.  EOMI.  Moves all extremities well. Skin:  Skin is warm, dry and intact. No rash noted. Psychiatric: Mood and affect are normal. Speech and behavior are normal.  Normal judgement.  ____________________________________________   LABS (all labs ordered are listed, but only abnormal results are displayed)  Labs Reviewed  COMPREHENSIVE METABOLIC PANEL - Abnormal; Notable for the following:    Glucose, Bld 138 (*)    All other components within  normal limits  CBC - Abnormal; Notable for the following:    RDW 14.9 (*)    All other components within normal limits  URINALYSIS COMPLETEWITH MICROSCOPIC ([facility] ONLY) - Abnormal; Notable for the following:    Color, Urine YELLOW (*)    APPearance CLEAR (*)    Glucose, UA >500 (*)    Hgb urine dipstick 1+ (*)    Bacteria, UA FEW (*)    Squamous Epithelial / LPF 0-5 (*)    All other components within normal limits  LIPASE, BLOOD   ____________________________________________  EKG  ED ECG REPORT I, Rockne MenghiniNorman, Anne-Caroline, the attending physician, personally viewed and interpreted this  ECG.   Date: 05/15/2016  EKG Time: 1005  Rate: 78  Rhythm: normal sinus rhythm  Axis: Leftward  Intervals:none  ST&T Change: No ST elevation. No ischemic changes.  ____________________________________________  RADIOLOGY  No results found.  ____________________________________________   PROCEDURES  Procedure(s) performed: None  Critical Care performed: No ____________________________________________   INITIAL IMPRESSION / ASSESSMENT AND PLAN / ED COURSE  Pertinent labs & imaging results that were available during my care of the patient were reviewed by me and considered in my medical decision making (see chart for details).  60 y.o. female with recurrent epigastric pain while laying down at night, today was associated with diaphoresis and nausea and vomiting. The patient has reassuring vital signs and is afebrile here. Her abdominal examination does show some epigastric tenderness but no tenderness over the right upper quadrant that would be suggestive of gallbladder disease. The patient may have reflux or peptic or gastric ulcer disease, and I will initiate her on symptomatic treatment as well as a PPI for discharge. Atypical chest pain or ACS is possible although less likely; this does not feel like her previous heart attack, and she has no ischemic changes on her EKG. I have discussed  the importance of adhering to her medications today, so that if her symptoms persist her primary care physician can have her follow-up with GI as well as consider getting a cardiac stress test for risk stratification study. The patient understands return precautions as well as follow-up instructions.  ____________________________________________  FINAL CLINICAL IMPRESSION(S) / ED DIAGNOSES  Final diagnoses:  Epigastric pain  Non-intractable vomiting with nausea, vomiting of unspecified type  Diaphoresis      NEW MEDICATIONS STARTED DURING THIS VISIT:  New Prescriptions   OMEPRAZOLE (PRILOSEC) 40 MG CAPSULE    Take 1 capsule (40 mg total) by mouth daily.   ONDANSETRON (ZOFRAN ODT) 4 MG DISINTEGRATING TABLET    Take 1 tablet (4 mg total) by mouth every 8 (eight) hours as needed for nausea or vomiting.     Rockne Menghini, MD 05/15/16 1259

## 2016-06-08 ENCOUNTER — Ambulatory Visit: Payer: BLUE CROSS/BLUE SHIELD | Admitting: Family Medicine

## 2016-06-27 ENCOUNTER — Ambulatory Visit (INDEPENDENT_AMBULATORY_CARE_PROVIDER_SITE_OTHER): Payer: BLUE CROSS/BLUE SHIELD | Admitting: Family Medicine

## 2016-06-27 ENCOUNTER — Encounter: Payer: Self-pay | Admitting: Family Medicine

## 2016-06-27 VITALS — BP 126/74 | HR 93 | Temp 97.7°F | Resp 16 | Ht 63.0 in | Wt 235.5 lb

## 2016-06-27 DIAGNOSIS — I2510 Atherosclerotic heart disease of native coronary artery without angina pectoris: Secondary | ICD-10-CM

## 2016-06-27 DIAGNOSIS — E785 Hyperlipidemia, unspecified: Secondary | ICD-10-CM

## 2016-06-27 DIAGNOSIS — E669 Obesity, unspecified: Secondary | ICD-10-CM

## 2016-06-27 DIAGNOSIS — E1169 Type 2 diabetes mellitus with other specified complication: Secondary | ICD-10-CM

## 2016-06-27 DIAGNOSIS — I10 Essential (primary) hypertension: Secondary | ICD-10-CM | POA: Diagnosis not present

## 2016-06-27 DIAGNOSIS — Z1239 Encounter for other screening for malignant neoplasm of breast: Secondary | ICD-10-CM | POA: Diagnosis not present

## 2016-06-27 DIAGNOSIS — K219 Gastro-esophageal reflux disease without esophagitis: Secondary | ICD-10-CM

## 2016-06-27 DIAGNOSIS — Z9861 Coronary angioplasty status: Secondary | ICD-10-CM

## 2016-06-27 DIAGNOSIS — E119 Type 2 diabetes mellitus without complications: Secondary | ICD-10-CM

## 2016-06-27 LAB — POCT GLYCOSYLATED HEMOGLOBIN (HGB A1C): Hemoglobin A1C: 6.6

## 2016-06-27 MED ORDER — OMEPRAZOLE 40 MG PO CPDR: 40.0000 mg | DELAYED_RELEASE_CAPSULE | Freq: Every day | ORAL | 0 refills | Status: DC

## 2016-06-27 MED ORDER — SIMVASTATIN 40 MG PO TABS: 40.0000 mg | ORAL_TABLET | Freq: Every day | ORAL | 1 refills | Status: DC

## 2016-06-27 MED ORDER — PANTOPRAZOLE SODIUM 40 MG PO TBEC: 40.0000 mg | DELAYED_RELEASE_TABLET | Freq: Every day | ORAL | 3 refills | Status: DC

## 2016-06-27 MED ORDER — DAPAGLIFLOZIN PRO-METFORMIN ER 5-500 MG PO TB24: 1.0000 | ORAL_TABLET | Freq: Every day | ORAL | 2 refills | Status: DC

## 2016-06-27 MED ORDER — LISINOPRIL 10 MG PO TABS: 10.0000 mg | ORAL_TABLET | Freq: Every day | ORAL | 1 refills | Status: DC

## 2016-06-27 NOTE — Progress Notes (Signed)
Name: Kathy Price   MRN: [id number]    DOB: 01-12-1956   Date:06/27/2016       Progress Note  Subjective  Chief Complaint  Chief Complaint  Patient presents with  . Medication Refill    3 month F/U  . Gastroesophageal Reflux    Patient has been staying away from caffeine and taking medication and has improved symptoms.  . Diabetes    Checks every so often and BS average-114  . Hyperlipidemia  . Hypertension    HPI  DMII: she has been taking Metformin, no side effects of medication, but hgbA1C has gone up from 6.0 to 6.7% but today it is down 6.6% again.  Fsbs has been around 114's , she has occasional polyphagia ( craving sweets - it has improved ) she denies  polydipsia and polyuria. Eye exam is up to date.  No paresthesia, urine micro within normal limits  CAD: s/p MI and stent placement 08/2001,  seen by Dr. Nehemiah Massed 01/2016 and has ischemic cardiomyopathy with left infection wall hypokinesis. Stress test only showed old lesion, no acute ischemia   She has been taking apirin 81 mg daily also lisinopril. Not on beta-blocker, she had an episode of bradycardia in the past. She denies  SOB or diaphoresis. No orthopnea. She had two visits to [facility] since April for indigestion and EKG negative, symptoms improved with Omeprazole.   GERD: she was advised by [facility] physician to start Omeprazole and eat a GERD diet, she states she is feeling much better, she occasionally has symptoms when not following a GERD diet. Still drinking coffee but cutting down  Morbid Obesity: weight has been stable, slight decrease, she is walking a little more. Avoiding sweets. Eating more vegetables and baked food.  Hyperlipidemia: taking Simvastatin , denies side effects, last LDL was at goal, can't tolerate Lipitor, and she does not want to switch to Crestor because of cost  UARS: found on Sleep Study, she did not get referral to ENT again. We will try it again    Patient Active Problem List   Diagnosis Date  Noted  . Cardiomyopathy, ischemic 01/30/2016  . CAD S/P percutaneous coronary angioplasty 09/07/2015  . Diabetes mellitus type 2 in obese ([place]) 09/07/2015  . Dyslipidemia 09/07/2015  . H/O: hysterectomy 09/07/2015  . H/O acute myocardial infarction 09/07/2015  . Vitamin D deficiency 09/07/2015  . Morbid obesity ([place]) 09/07/2015  . Bradycardia 05/13/2014  . Arteriosclerosis of coronary artery 05/13/2014  . Essential (primary) hypertension 05/13/2014  . UARS (upper airway resistance syndrome) 01/23/2013    Past Surgical History:  Procedure Laterality Date  . ABDOMINAL HYSTERECTOMY      Family History  Problem Relation Age of Onset  . Breast cancer Sister 75  . Diabetes Mother   . Hypertension Mother   . Cancer Father   . CAD Brother     Social History   Social History  . Marital status: Married    Spouse name: N/A  . Number of children: N/A  . Years of education: N/A   Occupational History  . Not on file.   Social History Main Topics  . Smoking status: Former Smoker    Packs/day: 1.00    Years: 25.00    Types: Cigarettes    Quit date: 09/06/2000  . Smokeless tobacco: Never Used  . Alcohol use No  . Drug use: No  . Sexual activity: Yes    Partners: Male   Other Topics Concern  . Not on file  Social History Narrative  . No narrative on file     Current Outpatient Prescriptions:  .  aspirin EC 81 MG tablet, Take 1 tablet by mouth daily., Disp: , Rfl:  .  Cholecalciferol (VITAMIN D3) 2000 UNITS capsule, Take 1 capsule by mouth daily., Disp: , Rfl:  .  Dapagliflozin-Metformin HCl ER (XIGDUO XR) 5-500 MG TB24, Take 1 tablet by mouth daily., Disp: 30 tablet, Rfl: 2 .  lisinopril (PRINIVIL,ZESTRIL) 10 MG tablet, Take 1 tablet (10 mg total) by mouth daily., Disp: 90 tablet, Rfl: 1 .  Multiple Vitamins-Minerals (WOMENS MULTIVITAMIN PO), Take 1 tablet by mouth. , Disp: , Rfl:  .  simvastatin (ZOCOR) 40 MG tablet, Take 1 tablet (40 mg total) by mouth daily., Disp:  90 tablet, Rfl: 1 .  pantoprazole (PROTONIX) 40 MG tablet, Take 1 tablet (40 mg total) by mouth daily., Disp: 30 tablet, Rfl: 3  Allergies  Allergen Reactions  . Metoprolol     severe bradycardia  . Atorvastatin Rash  . Sulfa Antibiotics Rash and Hives     ROS  Constitutional: Negative for fever or significant weight change.  Respiratory: Negative for cough and shortness of breath.   Cardiovascular: Negative for chest pain or palpitations.  Gastrointestinal: Negative for abdominal pain, no bowel changes.  Musculoskeletal: Negative for gait problem or joint swelling.  Skin: Negative for rash.  Neurological: Negative for dizziness or headache.  No other specific complaints in a complete review of systems (except as listed in HPI above).  Objective  Vitals:   06/27/16 0831  BP: 126/74  Pulse: 93  Resp: 16  Temp: 97.7 F (36.5 C)  TempSrc: Oral  SpO2: 94%  Weight: 235 lb 8 oz (106.8 kg)  Height: 5' 3"  (1.6 m)    Body mass index is 41.72 kg/m.  Physical Exam  Constitutional: Patient appears well-developed and well-nourished. Obese  No distress.  HEENT: head atraumatic, normocephalic, pupils equal and reactive to light,neck supple, throat within normal limits Cardiovascular: Normal rate, regular rhythm and normal heart sounds.  No murmur heard. trace BLE edema. Pulmonary/Chest: Effort normal and breath sounds normal. No respiratory distress. Abdominal: Soft.  There is no tenderness. Psychiatric: Patient has a normal mood and affect. behavior is normal. Judgment and thought content normal.  Recent Results (from the past 2160 hour(s))  Lipase, blood     Status: None   Collection Time: 05/15/16  9:56 AM  Result Value Ref Range   Lipase 25 11 - 51 U/L  Comprehensive metabolic panel     Status: Abnormal   Collection Time: 05/15/16  9:56 AM  Result Value Ref Range   Sodium 139 135 - 145 mmol/L   Potassium 4.1 3.5 - 5.1 mmol/L   Chloride 108 101 - 111 mmol/L   CO2 23  22 - 32 mmol/L   Glucose, Bld 138 (H) 65 - 99 mg/dL   BUN 17 6 - 20 mg/dL   Creatinine, Ser 0.98 0.44 - 1.00 mg/dL   Calcium 9.7 8.9 - 10.3 mg/dL   Total Protein 7.9 6.5 - 8.1 g/dL   Albumin 4.4 3.5 - 5.0 g/dL   AST 19 15 - 41 U/L   ALT 16 14 - 54 U/L   Alkaline Phosphatase 65 38 - 126 U/L   Total Bilirubin 0.4 0.3 - 1.2 mg/dL   GFR calc non Af Amer >60 >60 mL/min   GFR calc Af Amer >60 >60 mL/min    Comment: (NOTE) The eGFR has been calculated using the  CKD EPI equation. This calculation has not been validated in all clinical situations. eGFR's persistently <60 mL/min signify possible Chronic Kidney Disease.    Anion gap 8 5 - 15  CBC     Status: Abnormal   Collection Time: 05/15/16  9:56 AM  Result Value Ref Range   WBC 7.8 3.6 - 11.0 K/uL   RBC 4.92 3.80 - 5.20 MIL/uL   Hemoglobin 13.6 12.0 - 16.0 g/dL   HCT 40.1 35.0 - 47.0 %   MCV 81.6 80.0 - 100.0 fL   MCH 27.7 26.0 - 34.0 pg   MCHC 33.9 32.0 - 36.0 g/dL   RDW 14.9 (H) 11.5 - 14.5 %   Platelets 233 150 - 440 K/uL  Urinalysis complete, with microscopic     Status: Abnormal   Collection Time: 05/15/16  9:56 AM  Result Value Ref Range   Color, Urine YELLOW (A) YELLOW   APPearance CLEAR (A) CLEAR   Glucose, UA >500 (A) NEGATIVE mg/dL   Bilirubin Urine NEGATIVE NEGATIVE   Ketones, ur NEGATIVE NEGATIVE mg/dL   Specific Gravity, Urine 1.011 1.005 - 1.030   Hgb urine dipstick 1+ (A) NEGATIVE   pH 5.0 5.0 - 8.0   Protein, ur NEGATIVE NEGATIVE mg/dL   Nitrite NEGATIVE NEGATIVE   Leukocytes, UA NEGATIVE NEGATIVE   RBC / HPF 0-5 0 - 5 RBC/hpf   WBC, UA 6-30 0 - 5 WBC/hpf   Bacteria, UA FEW (A) NONE SEEN   Squamous Epithelial / LPF 0-5 (A) NONE SEEN   Mucous PRESENT    Hyaline Casts, UA PRESENT     PHQ2/9: Depression screen [facility] 2/9 06/27/2016 03/07/2016 09/07/2015  Decreased Interest 0 0 0  Down, Depressed, Hopeless 0 0 0  PHQ - 2 Score 0 0 0     Fall Risk: Fall Risk  06/27/2016 03/07/2016 09/07/2015  Falls in the  past year? No No No     Functional Status Survey: Is the patient deaf or have difficulty hearing?: No Does the patient have difficulty seeing, even when wearing glasses/contacts?: No Does the patient have difficulty concentrating, remembering, or making decisions?: No Does the patient have difficulty walking or climbing stairs?: No Does the patient have difficulty dressing or bathing?: No Does the patient have difficulty doing errands alone such as visiting a doctor's office or shopping?: No    Assessment & Plan  1. Diabetes mellitus type 2 in obese (HCC)  - POCT HgB A1C - Dapagliflozin-Metformin HCl ER (XIGDUO XR) 5-500 MG TB24; Take 1 tablet by mouth daily.  Dispense: 30 tablet; Refill: 2  2. Dyslipidemia  - simvastatin (ZOCOR) 40 MG tablet; Take 1 tablet (40 mg total) by mouth daily.  Dispense: 90 tablet; Refill: 1  3. Essential (primary) hypertension  - lisinopril (PRINIVIL,ZESTRIL) 10 MG tablet; Take 1 tablet (10 mg total) by mouth daily.  Dispense: 90 tablet; Refill: 1  4. Morbid obesity due to excess calories [facility])  Discussed with the patient the risk posed by an increased BMI. Discussed importance of portion control, calorie counting and at least 150 minutes of physical activity weekly. Avoid sweet beverages and drink more water. Eat at least 6 servings of fruit and vegetables daily   5. CAD S/P percutaneous coronary angioplasty  - lisinopril (PRINIVIL,ZESTRIL) 10 MG tablet; Take 1 tablet (10 mg total) by mouth daily.  Dispense: 90 tablet; Refill: 1  6. Gastroesophageal reflux disease without esophagitis  - pantoprazole (PROTONIX) 40 MG tablet; Take 1 tablet (40 mg total) by  mouth daily.  Dispense: 30 tablet; Refill: 3 Stop omeprazole since it interacts with Simvastatin and try Pantoprazole instead  7. Breast cancer screening  - MM Digital Screening; Future

## 2016-06-27 NOTE — Patient Instructions (Signed)
Gastroesophageal Reflux Disease, Adult Normally, food travels down the esophagus and stays in the stomach to be digested. However, when a person has gastroesophageal reflux disease (GERD), food and stomach acid move back up into the esophagus. When this happens, the esophagus becomes sore and inflamed. Over time, GERD can create small holes (ulcers) in the lining of the esophagus.  CAUSES This condition is caused by a problem with the muscle between the esophagus and the stomach (lower esophageal sphincter, or LES). Normally, the LES muscle closes after food passes through the esophagus to the stomach. When the LES is weakened or abnormal, it does not close properly, and that allows food and stomach acid to go back up into the esophagus. The LES can be weakened by certain dietary substances, medicines, and medical conditions, including:  Tobacco use.  Pregnancy.  Having a hiatal hernia.  Heavy alcohol use.  Certain foods and beverages, such as coffee, chocolate, onions, and peppermint. RISK FACTORS This condition is more likely to develop in:  People who have an increased body weight.  People who have connective tissue disorders.  People who use NSAID medicines. SYMPTOMS Symptoms of this condition include:  Heartburn.  Difficult or painful swallowing.  The feeling of having a lump in the throat.  Abitter taste in the mouth.  Bad breath.  Having a large amount of saliva.  Having an upset or bloated stomach.  Belching.  Chest pain.  Shortness of breath or wheezing.  Ongoing (chronic) cough or a night-time cough.  Wearing away of tooth enamel.  Weight loss. Different conditions can cause chest pain. Make sure to see your health care provider if you experience chest pain. DIAGNOSIS Your health care provider will take a medical history and perform a physical exam. To determine if you have mild or severe GERD, your health care provider may also monitor how you respond  to treatment. You may also have other tests, including:  An endoscopy toexamine your stomach and esophagus with a small camera.  A test thatmeasures the acidity level in your esophagus.  A test thatmeasures how much pressure is on your esophagus.  A barium swallow or modified barium swallow to show the shape, size, and functioning of your esophagus. TREATMENT The goal of treatment is to help relieve your symptoms and to prevent complications. Treatment for this condition may vary depending on how severe your symptoms are. Your health care provider may recommend:  Changes to your diet.  Medicine.  Surgery. HOME CARE INSTRUCTIONS Diet  Follow a diet as recommended by your health care provider. This may involve avoiding foods and drinks such as:  Coffee and tea (with or without caffeine).  Drinks that containalcohol.  Energy drinks and sports drinks.  Carbonated drinks or sodas.  Chocolate and cocoa.  Peppermint and mint flavorings.  Garlic and onions.  Horseradish.  Spicy and acidic foods, including peppers, chili powder, curry powder, vinegar, hot sauces, and barbecue sauce.  Citrus fruit juices and citrus fruits, such as oranges, lemons, and limes.  Tomato-based foods, such as red sauce, chili, salsa, and pizza with red sauce.  Fried and fatty foods, such as donuts, french fries, potato chips, and high-fat dressings.  High-fat meats, such as hot dogs and fatty cuts of red and white meats, such as rib eye steak, sausage, ham, and bacon.  High-fat dairy items, such as whole milk, butter, and cream cheese.  Eat small, frequent meals instead of large meals.  Avoid drinking large amounts of liquid with your   meals.  Avoid eating meals during the 2-3 hours before bedtime.  Avoid lying down right after you eat.  Do not exercise right after you eat. General Instructions  Pay attention to any changes in your symptoms.  Take over-the-counter and prescription  medicines only as told by your health care provider. Do not take aspirin, ibuprofen, or other NSAIDs unless your health care provider told you to do so.  Do not use any tobacco products, including cigarettes, chewing tobacco, and e-cigarettes. If you need help quitting, ask your health care provider.  Wear loose-fitting clothing. Do not wear anything tight around your waist that causes pressure on your abdomen.  Raise (elevate) the head of your bed 6 inches (15cm).  Try to reduce your stress, such as with yoga or meditation. If you need help reducing stress, ask your health care provider.  If you are overweight, reduce your weight to an amount that is healthy for you. Ask your health care provider for guidance about a safe weight loss goal.  Keep all follow-up visits as told by your health care provider. This is important. SEEK MEDICAL CARE IF:  You have new symptoms.  You have unexplained weight loss.  You have difficulty swallowing, or it hurts to swallow.  You have wheezing or a persistent cough.  Your symptoms do not improve with treatment.  You have a hoarse voice. SEEK IMMEDIATE MEDICAL CARE IF:  You have pain in your arms, neck, jaw, teeth, or back.  You feel sweaty, dizzy, or light-headed.  You have chest pain or shortness of breath.  You vomit and your vomit looks like blood or coffee grounds.  You faint.  Your stool is bloody or black.  You cannot swallow, drink, or eat.   This information is not intended to replace advice given to you by your health care provider. Make sure you discuss any questions you have with your health care provider.   Document Released: 07/25/2005 Document Revised: 07/06/2015 Document Reviewed: 02/09/2015 Elsevier Interactive Patient Education 2016 [employer] Food Choices for Gastroesophageal Reflux Disease, Adult When you have gastroesophageal reflux disease (GERD), the foods you eat and your eating habits are very important.  Choosing the right foods can help ease the discomfort of GERD. WHAT GENERAL GUIDELINES DO I NEED TO FOLLOW?  Choose fruits, vegetables, whole grains, low-fat dairy products, and low-fat meat, fish, and poultry.  Limit fats such as oils, salad dressings, butter, nuts, and avocado.  Keep a food diary to identify foods that cause symptoms.  Avoid foods that cause reflux. These may be different for different people.  Eat frequent small meals instead of three large meals each day.  Eat your meals slowly, in a relaxed setting.  Limit fried foods.  Cook foods using methods other than frying.  Avoid drinking alcohol.  Avoid drinking large amounts of liquids with your meals.  Avoid bending over or lying down until 2-3 hours after eating. WHAT FOODS ARE NOT RECOMMENDED? The following are some foods and drinks that may worsen your symptoms: Vegetables Tomatoes. Tomato juice. Tomato and spaghetti sauce. Chili peppers. Onion and garlic. Horseradish. Fruits Oranges, grapefruit, and lemon (fruit and juice). Meats High-fat meats, fish, and poultry. This includes hot dogs, ribs, ham, sausage, salami, and bacon. Dairy Whole milk and chocolate milk. Sour cream. Cream. Butter. Ice cream. Cream cheese.  Beverages Coffee and tea, with or without caffeine. Carbonated beverages or energy drinks. Condiments Hot sauce. Barbecue sauce.  Sweets/Desserts Chocolate and cocoa. Donuts. Peppermint and spearmint.   Fats and Oils High-fat foods, including French fries and potato chips. Other Vinegar. Strong spices, such as black pepper, white pepper, red pepper, cayenne, curry powder, cloves, ginger, and chili powder. The items listed above may not be a complete list of foods and beverages to avoid. Contact your dietitian for more information.   This information is not intended to replace advice given to you by your health care provider. Make sure you discuss any questions you have with your health care  provider.   Document Released: 10/15/2005 Document Revised: 11/05/2014 Document Reviewed: 08/19/2013 Elsevier Interactive Patient Education 2016 [employer]  

## 2016-08-28 ENCOUNTER — Emergency Department
Admission: EM | Admit: 2016-08-28 | Discharge: 2016-08-28 | Disposition: A | Discharge status: Home / Self Care | Payer: BLUE CROSS/BLUE SHIELD | Attending: Emergency Medicine | Admitting: Emergency Medicine

## 2016-08-28 ENCOUNTER — Other Ambulatory Visit: Payer: Self-pay

## 2016-08-28 ENCOUNTER — Emergency Department: Payer: BLUE CROSS/BLUE SHIELD

## 2016-08-28 ENCOUNTER — Encounter: Payer: Self-pay | Admitting: Emergency Medicine

## 2016-08-28 DIAGNOSIS — Z7982 Long term (current) use of aspirin: Secondary | ICD-10-CM | POA: Insufficient documentation

## 2016-08-28 DIAGNOSIS — E119 Type 2 diabetes mellitus without complications: Secondary | ICD-10-CM | POA: Insufficient documentation

## 2016-08-28 DIAGNOSIS — Z87891 Personal history of nicotine dependence: Secondary | ICD-10-CM | POA: Diagnosis not present

## 2016-08-28 DIAGNOSIS — R1013 Epigastric pain: Secondary | ICD-10-CM

## 2016-08-28 DIAGNOSIS — I2510 Atherosclerotic heart disease of native coronary artery without angina pectoris: Secondary | ICD-10-CM | POA: Diagnosis not present

## 2016-08-28 DIAGNOSIS — Z79899 Other long term (current) drug therapy: Secondary | ICD-10-CM | POA: Insufficient documentation

## 2016-08-28 DIAGNOSIS — I10 Essential (primary) hypertension: Secondary | ICD-10-CM | POA: Insufficient documentation

## 2016-08-28 DIAGNOSIS — R1110 Vomiting, unspecified: Secondary | ICD-10-CM | POA: Insufficient documentation

## 2016-08-28 DIAGNOSIS — K219 Gastro-esophageal reflux disease without esophagitis: Secondary | ICD-10-CM | POA: Insufficient documentation

## 2016-08-28 DIAGNOSIS — R1010 Upper abdominal pain, unspecified: Secondary | ICD-10-CM | POA: Diagnosis present

## 2016-08-28 LAB — COMPREHENSIVE METABOLIC PANEL
ALT: 15 U/L (ref 14–54)
AST: 21 U/L (ref 15–41)
Albumin: 4.2 g/dL (ref 3.5–5.0)
Alkaline Phosphatase: 69 U/L (ref 38–126)
Anion gap: 6 (ref 5–15)
BUN: 15 mg/dL (ref 6–20)
CHLORIDE: 107 mmol/L (ref 101–111)
CO2: 26 mmol/L (ref 22–32)
CREATININE: 0.9 mg/dL (ref 0.44–1.00)
Calcium: 9.6 mg/dL (ref 8.9–10.3)
GFR calc Af Amer: >60 mL/min (ref >60)
GFR calc non Af Amer: >60 mL/min (ref >60)
GLUCOSE: 138 mg/dL — AB (ref 65–99)
Potassium: 4.6 mmol/L (ref 3.5–5.1)
SODIUM: 139 mmol/L (ref 135–145)
Total Bilirubin: 0.4 mg/dL (ref 0.3–1.2)
Total Protein: 7.5 g/dL (ref 6.5–8.1)

## 2016-08-28 LAB — URINALYSIS COMPLETE WITH MICROSCOPIC (ARMC ONLY)
Bilirubin Urine: NEGATIVE
GLUCOSE, UA: NEGATIVE mg/dL
HGB URINE DIPSTICK: NEGATIVE
Ketones, ur: NEGATIVE mg/dL
Leukocytes, UA: NEGATIVE
Nitrite: POSITIVE — AB
Protein, ur: NEGATIVE mg/dL
Specific Gravity, Urine: 1.012 (ref 1.005–1.030)
pH: 5 (ref 5.0–8.0)

## 2016-08-28 LAB — CBC
HCT: 37.9 % (ref 35.0–47.0)
Hemoglobin: 12.8 g/dL (ref 12.0–16.0)
MCH: 27.5 pg (ref 26.0–34.0)
MCHC: 33.8 g/dL (ref 32.0–36.0)
MCV: 81.4 fL (ref 80.0–100.0)
PLATELETS: 244 10*3/uL (ref 150–440)
RBC: 4.66 MIL/uL (ref 3.80–5.20)
RDW: 14.7 % — AB (ref 11.5–14.5)
WBC: 9.7 10*3/uL (ref 3.6–11.0)

## 2016-08-28 LAB — LIPASE, BLOOD: LIPASE: 24 U/L (ref 11–51)

## 2016-08-28 LAB — TROPONIN I

## 2016-08-28 MED ORDER — TRAMADOL HCL 50 MG PO TABS: 50.0000 mg | ORAL_TABLET | Freq: Four times a day (QID) | ORAL | 0 refills | Status: DC | PRN

## 2016-08-28 MED ORDER — ONDANSETRON 4 MG PO TBDP: 4.0000 mg | ORAL_TABLET | Freq: Three times a day (TID) | ORAL | 0 refills | Status: DC | PRN

## 2016-08-28 MED ORDER — FAMOTIDINE 20 MG PO TABS: 20.0000 mg | ORAL_TABLET | Freq: Two times a day (BID) | ORAL | 1 refills | Status: DC

## 2016-08-28 MED ORDER — ONDANSETRON 4 MG PO TBDP
4.0000 mg | ORAL_TABLET | Freq: Once | ORAL | Status: AC
  Administered 2016-08-28: 4 mg via ORAL
  Filled 2016-08-28: qty 1

## 2016-08-28 MED ORDER — FAMOTIDINE 20 MG PO TABS
20.0000 mg | ORAL_TABLET | Freq: Once | ORAL | Status: AC
  Administered 2016-08-28: 20 mg via ORAL
  Filled 2016-08-28: qty 1

## 2016-08-28 MED ORDER — GI COCKTAIL ~~LOC~~
30.0000 mL | Freq: Once | ORAL | Status: AC
Start: 2016-08-28 — End: 2016-08-28
  Administered 2016-08-28: 30 mL via ORAL
  Filled 2016-08-28: qty 30

## 2016-08-28 NOTE — ED Provider Notes (Signed)
[facility] Emergency Department Provider Note        Time seen: ----------------------------------------- 12:10 PM on 08/28/2016 -----------------------------------------    I have reviewed the triage vital signs and the nursing notes.   HISTORY  Chief Complaint Abdominal Pain; Nausea; and Diarrhea    HPI Kathy Price is a 60 y.o. female who presents to the ER for upper abdominal pain with associated nausea and vomiting since 4 AM. Patient states she was able to drink some ginger ale hours ago but she vomited back up. She states she's had a history of this before, was placed on pantoprazole but she broke out into a rash from that and had stopped taking it. She denies fevers chills or other complaints at this time.   Past Medical History:  Diagnosis Date  . Back pain   . Breathing-related sleep disorder   . CAD (coronary artery disease)   . Diabetes mellitus without complication (HCC)   . History of MI (myocardial infarction)   . Hyperlipidemia   . Obese   . Postablative ovarian failure   . Vitamin D deficiency     Patient Active Problem List   Diagnosis Date Noted  . Cardiomyopathy, ischemic 01/30/2016  . CAD S/P percutaneous coronary angioplasty 09/07/2015  . Diabetes mellitus type 2 in obese (HCC) 09/07/2015  . Dyslipidemia 09/07/2015  . H/O: hysterectomy 09/07/2015  . H/O acute myocardial infarction 09/07/2015  . Vitamin D deficiency 09/07/2015  . Morbid obesity (HCC) 09/07/2015  . Bradycardia 05/13/2014  . Arteriosclerosis of coronary artery 05/13/2014  . Essential (primary) hypertension 05/13/2014  . UARS (upper airway resistance syndrome) 01/23/2013    Past Surgical History:  Procedure Laterality Date  . ABDOMINAL HYSTERECTOMY      Allergies Metoprolol; Atorvastatin; and Sulfa antibiotics  Social History Social History  Substance Use Topics  . Smoking status: Former Smoker    Packs/day: 1.00    Years: 25.00    Types:  Cigarettes    Quit date: 09/06/2000  . Smokeless tobacco: Never Used  . Alcohol use No    Review of Systems Constitutional: Negative for fever. Cardiovascular: Negative for chest pain. Respiratory: Negative for shortness of breath. Gastrointestinal: Positive for abdominal pain, vomiting Genitourinary: Negative for dysuria. Musculoskeletal: Negative for back pain. Skin: Negative for rash. Neurological: Negative for headaches, focal weakness or numbness.  10-point ROS otherwise negative.  ____________________________________________   PHYSICAL EXAM:  VITAL SIGNS: ED Triage Vitals  Enc Vitals Group     BP 08/28/16 1016 (!) 119/47     Pulse Rate 08/28/16 1014 71     Resp 08/28/16 1014 16     Temp 08/28/16 1014 98.3 F (36.8 C)     Temp Source 08/28/16 1014 Oral     SpO2 08/28/16 1014 98 %     Weight 08/28/16 1015 235 lb (106.6 kg)     Height 08/28/16 1015 5\' 3"  (1.6 m)     Head Circumference --      Peak Flow --      Pain Score 08/28/16 1022 8     Pain Loc --      Pain Edu? --      Excl. in GC? --     Constitutional: Alert and oriented. Well appearing and in no distress. Eyes: Conjunctivae are normal. PERRL. Normal extraocular movements. ENT   Head: Normocephalic and atraumatic.   Nose: No congestion/rhinnorhea.   Mouth/Throat: Mucous membranes are moist.   Neck: No stridor. Cardiovascular: Normal rate, regular rhythm.  No murmurs, rubs, or gallops. Respiratory: Normal respiratory effort without tachypnea nor retractions. Breath sounds are clear and equal bilaterally. No wheezes/rales/rhonchi. Gastrointestinal: Soft and nontender. Normal bowel sounds Musculoskeletal: Nontender with normal range of motion in all extremities. No lower extremity tenderness nor edema. Neurologic:  Normal speech and language. No gross focal neurologic deficits are appreciated.  Skin:  Skin is warm, dry and intact. No rash noted. Psychiatric: Mood and affect are normal. Speech  and behavior are normal.  ____________________________________________  EKG: Interpreted by me.Sinus rhythm with a rate of 72 bpm, normal PR interval, normal QRS, normal QT, leftward axis.  ____________________________________________  ED COURSE:  Pertinent labs & imaging results that were available during my care of the patient were reviewed by me and considered in my medical decision making (see chart for details). Clinical Course  She presents to the ER with epigastric pain, likely GERD related. We will assess with labs, give GI cocktail and reevaluate.  Procedures ____________________________________________   LABS (pertinent positives/negatives)  Labs Reviewed  COMPREHENSIVE METABOLIC PANEL - Abnormal; Notable for the following:       Result Value   Glucose, Bld 138 (*)    All other components within normal limits  CBC - Abnormal; Notable for the following:    RDW 14.7 (*)    All other components within normal limits  URINALYSIS COMPLETEWITH MICROSCOPIC ([facility] ONLY) - Abnormal; Notable for the following:    Color, Urine YELLOW (*)    APPearance HAZY (*)    Nitrite POSITIVE (*)    Bacteria, UA RARE (*)    Squamous Epithelial / LPF 0-5 (*)    All other components within normal limits  LIPASE, BLOOD  TROPONIN I    RADIOLOGY  Acute abdominal series IMPRESSION: Negative abdominal radiographs.  No acute cardiopulmonary disease.  Focal scarring or atelectasis in the left lung.  ____________________________________________  FINAL ASSESSMENT AND PLAN  Epigastric pain, GERD  Plan: Patient with labs and imaging as dictated above. Patient with epigastric pain which is likely GERD related. She's been given a GI cocktail as well as Pepcid. I will continue on that, she seemed to have a reaction to PPIs. She is encouraged to have close outpatient follow-up with her doctor for recheck.   Kathy Price, [name], MD   Note: This dictation was prepared with Dragon  dictation. Any transcriptional errors that result from this process are unintentional    Kathy FilbertJonathan E Williams, MD 08/28/16 1322

## 2016-08-28 NOTE — ED Notes (Signed)
Pt returned from X-ray.  

## 2016-08-28 NOTE — ED Notes (Signed)
Patient transported to X-ray 

## 2016-08-28 NOTE — ED Notes (Signed)
Dr. Williams at pt's bedside. 

## 2016-08-28 NOTE — ED Notes (Signed)
Pt brought back to her hall bed. Pt helped into bed. Pt ambulating good. Pt stating upper abdominal pain, nausea, and vomiting since 4 am. Pt stating last emesis hours ago. Pt in NAD at this time. Pt given warm blanket. Family member brought back and given a chair.

## 2016-09-25 ENCOUNTER — Encounter: Payer: Self-pay | Admitting: Family Medicine

## 2016-09-25 ENCOUNTER — Ambulatory Visit (INDEPENDENT_AMBULATORY_CARE_PROVIDER_SITE_OTHER): Payer: BLUE CROSS/BLUE SHIELD | Admitting: Family Medicine

## 2016-09-25 VITALS — BP 136/64 | HR 86 | Temp 98.7°F | Ht 60.5 in | Wt 234.3 lb

## 2016-09-25 DIAGNOSIS — R29890 Loss of height: Secondary | ICD-10-CM | POA: Diagnosis not present

## 2016-09-25 DIAGNOSIS — I2510 Atherosclerotic heart disease of native coronary artery without angina pectoris: Secondary | ICD-10-CM

## 2016-09-25 DIAGNOSIS — E785 Hyperlipidemia, unspecified: Secondary | ICD-10-CM

## 2016-09-25 DIAGNOSIS — Z780 Asymptomatic menopausal state: Secondary | ICD-10-CM | POA: Diagnosis not present

## 2016-09-25 DIAGNOSIS — Z01419 Encounter for gynecological examination (general) (routine) without abnormal findings: Secondary | ICD-10-CM | POA: Diagnosis not present

## 2016-09-25 DIAGNOSIS — Z23 Encounter for immunization: Secondary | ICD-10-CM | POA: Diagnosis not present

## 2016-09-25 DIAGNOSIS — E559 Vitamin D deficiency, unspecified: Secondary | ICD-10-CM

## 2016-09-25 DIAGNOSIS — Z1239 Encounter for other screening for malignant neoplasm of breast: Secondary | ICD-10-CM

## 2016-09-25 DIAGNOSIS — K219 Gastro-esophageal reflux disease without esophagitis: Secondary | ICD-10-CM | POA: Diagnosis not present

## 2016-09-25 DIAGNOSIS — Z2911 Encounter for prophylactic immunotherapy for respiratory syncytial virus (RSV): Secondary | ICD-10-CM

## 2016-09-25 DIAGNOSIS — E669 Obesity, unspecified: Secondary | ICD-10-CM

## 2016-09-25 DIAGNOSIS — Z114 Encounter for screening for human immunodeficiency virus [HIV]: Secondary | ICD-10-CM

## 2016-09-25 DIAGNOSIS — Z9861 Coronary angioplasty status: Secondary | ICD-10-CM

## 2016-09-25 DIAGNOSIS — I10 Essential (primary) hypertension: Secondary | ICD-10-CM | POA: Diagnosis not present

## 2016-09-25 DIAGNOSIS — E1169 Type 2 diabetes mellitus with other specified complication: Secondary | ICD-10-CM

## 2016-09-25 DIAGNOSIS — Z1231 Encounter for screening mammogram for malignant neoplasm of breast: Secondary | ICD-10-CM | POA: Diagnosis not present

## 2016-09-25 LAB — LIPID PANEL
CHOLESTEROL: 220 mg/dL — AB (ref <200)
HDL: 54 mg/dL (ref >50)
LDL Cholesterol: 121 mg/dL — ABNORMAL HIGH (ref <100)
TRIGLYCERIDES: 226 mg/dL — AB (ref <150)
Total CHOL/HDL Ratio: 4.1 Ratio (ref <5.0)
VLDL: 45 mg/dL — AB (ref <30)

## 2016-09-25 MED ORDER — DAPAGLIFLOZIN PRO-METFORMIN ER 5-500 MG PO TB24: 1.0000 | ORAL_TABLET | Freq: Every day | ORAL | 2 refills | Status: DC

## 2016-09-25 NOTE — Progress Notes (Signed)
Name: Kathy Price   MRN: [id number]    DOB: [birth year]   Date:09/25/2016       Progress Note  Subjective  Chief Complaint  Chief Complaint  Patient presents with  . Annual Exam    HPI  Well Woman: she is up to date with colonoscopy, due for repeat mammogram, she lost some height and we discussed bone density and she is willing to have it done. S/p hysterectomy for dysfunctional bleeding. She denies bladder symptoms.   DMII: she is on Xigduo since August 2017, last hgbA1C was 6.6%.  Fsbs has been around 113's , she has occasional polyphagia ( craving sweets - it has improved ) she denies polydipsia and polyuria. Eye exam is up to date.  No paresthesia, urine micro within normal limits back in May 2017.   CAD: s/p MI and stent placement 08/2001,  seen by Dr. Nehemiah Massed 01/2016 and has ischemic cardiomyopathy with left  wall hypokinesis. Stress test only showed old lesion, no acute ischemia She has been taking apirin 81 mg daily also lisinopril. Not on beta-blocker, she had an episode of bradycardia in the past. She denies  SOB or diaphoresis. No orthopnea no lower extremity edema. She had two visits to [facility] since April for indigestion and EKG negative, off PPI because it caused her to break out in a rash   GERD: she was advised by [facility] physician back in April and was given Omeprazole, she was feeling well, but was switched to Pantoprazole because she takes Simvastatin. She broke out in a rash on Pantoprazole, went back to [facility] and is now on Pepcid and will have EGD done tomorrow. Discussed changing back to Omeprazole and switching from Simvastatin to Lipitor , however she has myalgia on lipitor.   Morbid Obesity: weight has been stable, slight decrease, she is walking a little more. Avoiding sweets. Eating more vegetables and baked food.  Hyperlipidemia: taking Simvastatin , denies side effects, last LDL was at goal, can't tolerate Lipitor, and she does not want to switch to Crestor because  of cost  UARS: found on Sleep Study, she did not get referral to ENT again. She can't go now because she has a balance.    Patient Active Problem List   Diagnosis Date Noted  . Cardiomyopathy, ischemic 01/30/2016  . CAD S/P percutaneous coronary angioplasty 09/07/2015  . Diabetes mellitus type 2 in obese ([place]) 09/07/2015  . Dyslipidemia 09/07/2015  . H/O: hysterectomy 09/07/2015  . H/O acute myocardial infarction 09/07/2015  . Vitamin D deficiency 09/07/2015  . Morbid obesity ([place]) 09/07/2015  . Bradycardia 05/13/2014  . Arteriosclerosis of coronary artery 05/13/2014  . Essential (primary) hypertension 05/13/2014  . UARS (upper airway resistance syndrome) 01/23/2013    Past Surgical History:  Procedure Laterality Date  . ABDOMINAL HYSTERECTOMY      Family History  Problem Relation Age of Onset  . Diabetes Mother   . Hypertension Mother   . Cancer Father   . CAD Brother   . Breast cancer Sister 62    Social History   Social History  . Marital status: Married    Spouse name: N/A  . Number of children: N/A  . Years of education: N/A   Occupational History  . Not on file.   Social History Main Topics  . Smoking status: Former Smoker    Packs/day: 1.00    Years: 25.00    Types: Cigarettes    Quit date: 09/06/2000  . Smokeless tobacco: Never Used  .  Alcohol use No  . Drug use: No  . Sexual activity: Yes    Partners: Male   Other Topics Concern  . Not on file   Social History Narrative  . No narrative on file     Current Outpatient Prescriptions:  .  aspirin EC 81 MG tablet, Take 1 tablet by mouth daily., Disp: , Rfl:  .  Cholecalciferol (VITAMIN D3) 2000 UNITS capsule, Take 1 capsule by mouth daily., Disp: , Rfl:  .  Dapagliflozin-Metformin HCl ER (XIGDUO XR) 5-500 MG TB24, Take 1 tablet by mouth daily., Disp: 30 tablet, Rfl: 2 .  famotidine (PEPCID) 20 MG tablet, Take 1 tablet (20 mg total) by mouth 2 (two) times daily., Disp: 60 tablet, Rfl: 1 .   lisinopril (PRINIVIL,ZESTRIL) 10 MG tablet, Take 1 tablet (10 mg total) by mouth daily., Disp: 90 tablet, Rfl: 1 .  Multiple Vitamins-Minerals (WOMENS MULTIVITAMIN PO), Take 1 tablet by mouth. , Disp: , Rfl:  .  simvastatin (ZOCOR) 40 MG tablet, Take 1 tablet (40 mg total) by mouth daily., Disp: 90 tablet, Rfl: 1 .  ondansetron (ZOFRAN ODT) 4 MG disintegrating tablet, Take 1 tablet (4 mg total) by mouth every 8 (eight) hours as needed for nausea or vomiting. (Patient not taking: Reported on 09/25/2016), Disp: 20 tablet, Rfl: 0 .  traMADol (ULTRAM) 50 MG tablet, Take 1 tablet (50 mg total) by mouth every 6 (six) hours as needed. (Patient not taking: Reported on 09/25/2016), Disp: 20 tablet, Rfl: 0  Allergies  Allergen Reactions  . Metoprolol     severe bradycardia  . Atorvastatin Rash  . Pantoprazole Rash  . Sulfa Antibiotics Rash and Hives     ROS  Constitutional: Negative for fever or weight change.  Respiratory: Negative for cough and shortness of breath.   Cardiovascular: Negative for chest pain or palpitations.  Gastrointestinal: Negative for abdominal pain, no bowel changes.  Musculoskeletal: Negative for gait problem or joint swelling.  Skin: positive for rash ( healing since she stopped protonix).  Neurological: Negative for dizziness or headache.  No other specific complaints in a complete review of systems (except as listed in HPI above).  Objective  Vitals:   09/25/16 0819  BP: 136/64  Pulse: 86  Temp: 98.7 F (37.1 C)  SpO2: 97%  Weight: 234 lb 4.8 oz (106.3 kg)  Height: 5' 0.5" (1.537 m)    Body mass index is 45.01 kg/m.  Physical Exam  Constitutional: Patient appears well-developed and obese No distress.  HENT: Head: Normocephalic and atraumatic. Ears: B TMs ok, no erythema or effusion; Nose: Nose normal. Mouth/Throat: Oropharynx is clear and moist. No oropharyngeal exudate.  Eyes: Conjunctivae and EOM are normal. Pupils are equal, round, and reactive to  light. No scleral icterus.  Neck: Normal range of motion. Neck supple. No JVD present. No thyromegaly present.  Cardiovascular: Normal rate, regular rhythm and normal heart sounds.  No murmur heard. No BLE edema. Pulmonary/Chest: Effort normal and breath sounds normal. No respiratory distress. Abdominal: Soft. Bowel sounds are normal, no distension. There is no tenderness. no masses Breast: no lumps or masses, no nipple discharge or rashes FEMALE GENITALIA:  External genitalia normal External urethra normal Pelvic not done RECTAL: not done Musculoskeletal: Normal range of motion, no joint effusions. No gross deformities Neurological: he is alert and oriented to person, place, and time. No cranial nerve deficit. Coordination, balance, strength, speech and gait are normal.  Skin: Skin is warm and dry. No rash noted. No erythema.  Psychiatric: Patient has a normal mood and affect. behavior is normal. Judgment and thought content normal.  Recent Results (from the past 2160 hour(s))  POCT HgB A1C     Status: None   Collection Time: 06/27/16  9:34 AM  Result Value Ref Range   Hemoglobin A1C 6.6   Lipase, blood     Status: None   Collection Time: 08/28/16 10:24 AM  Result Value Ref Range   Lipase 24 11 - 51 U/L  Comprehensive metabolic panel     Status: Abnormal   Collection Time: 08/28/16 10:24 AM  Result Value Ref Range   Sodium 139 135 - 145 mmol/L   Potassium 4.6 3.5 - 5.1 mmol/L   Chloride 107 101 - 111 mmol/L   CO2 26 22 - 32 mmol/L   Glucose, Bld 138 (H) 65 - 99 mg/dL   BUN 15 6 - 20 mg/dL   Creatinine, Ser 0.90 0.44 - 1.00 mg/dL   Calcium 9.6 8.9 - 10.3 mg/dL   Total Protein 7.5 6.5 - 8.1 g/dL   Albumin 4.2 3.5 - 5.0 g/dL   AST 21 15 - 41 U/L   ALT 15 14 - 54 U/L   Alkaline Phosphatase 69 38 - 126 U/L   Total Bilirubin 0.4 0.3 - 1.2 mg/dL   GFR calc non Af Amer >60 >60 mL/min   GFR calc Af Amer >60 >60 mL/min    Comment: (NOTE) The eGFR has been calculated using the CKD  EPI equation. This calculation has not been validated in all clinical situations. eGFR's persistently <60 mL/min signify possible Chronic Kidney Disease.    Anion gap 6 5 - 15  CBC     Status: Abnormal   Collection Time: 08/28/16 10:24 AM  Result Value Ref Range   WBC 9.7 3.6 - 11.0 K/uL   RBC 4.66 3.80 - 5.20 MIL/uL   Hemoglobin 12.8 12.0 - 16.0 g/dL   HCT 37.9 35.0 - 47.0 %   MCV 81.4 80.0 - 100.0 fL   MCH 27.5 26.0 - 34.0 pg   MCHC 33.8 32.0 - 36.0 g/dL   RDW 14.7 (H) 11.5 - 14.5 %   Platelets 244 150 - 440 K/uL  Urinalysis complete, with microscopic     Status: Abnormal   Collection Time: 08/28/16 10:24 AM  Result Value Ref Range   Color, Urine YELLOW (A) YELLOW   APPearance HAZY (A) CLEAR   Glucose, UA NEGATIVE NEGATIVE mg/dL   Bilirubin Urine NEGATIVE NEGATIVE   Ketones, ur NEGATIVE NEGATIVE mg/dL   Specific Gravity, Urine 1.012 1.005 - 1.030   Hgb urine dipstick NEGATIVE NEGATIVE   pH 5.0 5.0 - 8.0   Protein, ur NEGATIVE NEGATIVE mg/dL   Nitrite POSITIVE (A) NEGATIVE   Leukocytes, UA NEGATIVE NEGATIVE   RBC / HPF 0-5 0 - 5 RBC/hpf   WBC, UA 0-5 0 - 5 WBC/hpf   Bacteria, UA RARE (A) NONE SEEN   Squamous Epithelial / LPF 0-5 (A) NONE SEEN   Mucous PRESENT    Hyaline Casts, UA PRESENT   Troponin I     Status: None   Collection Time: 08/28/16 10:24 AM  Result Value Ref Range   Troponin I <0.03 <0.03 ng/mL    Diabetic Foot Exam: Diabetic Foot Exam - Simple   Simple Foot Form Diabetic Foot exam was performed with the following findings:  Yes 09/25/2016  8:53 AM  Visual Inspection No deformities, no ulcerations, no other skin breakdown bilaterally:  Yes Sensation Testing  Intact to touch and monofilament testing bilaterally:  Yes Pulse Check Posterior Tibialis and Dorsalis pulse intact bilaterally:  Yes Comments      PHQ2/9: Depression screen [facility] 2/9 09/25/2016 06/27/2016 03/07/2016 09/07/2015  Decreased Interest 0 0 0 0  Down, Depressed, Hopeless 0 0 0 0   PHQ - 2 Score 0 0 0 0     Fall Risk: Fall Risk  09/25/2016 06/27/2016 03/07/2016 09/07/2015  Falls in the past year? No No No No     Functional Status Survey: Is the patient deaf or have difficulty hearing?: No Does the patient have difficulty concentrating, remembering, or making decisions?: No Does the patient have difficulty walking or climbing stairs?: No Does the patient have difficulty dressing or bathing?: No Does the patient have difficulty doing errands alone such as visiting a doctor's office or shopping?: No   Assessment & Plan  1. Well woman exam  Discussed importance of 150 minutes of physical activity weekly, eat two servings of fish weekly, eat one serving of tree nuts ( cashews, pistachios, pecans, almonds.Marland Kitchen) every other day, eat 6 servings of fruit/vegetables daily and drink plenty of water and avoid sweet beverages.  - DG Bone Density; Future  2. Breast cancer screening  - MM Digital Screening; Future  3. Vitamin D deficiency  - VITAMIN D 25 Hydroxy (Vit-D Deficiency, Fractures)  4. Dyslipidemia  - Lipid panel  5. Essential (primary) hypertension  Continue medication   6. Morbid obesity due to excess calories [facility])  Discussed with the patient the risk posed by an increased BMI. Discussed importance of portion control, calorie counting and at least 150 minutes of physical activity weekly. Avoid sweet beverages and drink more water. Eat at least 6 servings of fruit and vegetables daily   7. Diabetes mellitus type 2 in obese (HCC)  - Dapagliflozin-Metformin HCl ER (XIGDUO XR) 5-500 MG TB24; Take 1 tablet by mouth daily.  Dispense: 30 tablet; Refill: 2 - Hemoglobin A1c  8. Need for shingles vaccine  -shingles  9. Encounter for screening for HIV  - HIV antibody  10. Gastroesophageal reflux disease without esophagitis  Keep follow up with GI  11. Loss of height  - DG Bone Density; Future  12. Post-menopausal  - DG Bone Density; Future

## 2016-09-26 ENCOUNTER — Encounter: Payer: Self-pay | Admitting: Gastroenterology

## 2016-09-26 ENCOUNTER — Ambulatory Visit (INDEPENDENT_AMBULATORY_CARE_PROVIDER_SITE_OTHER): Payer: BLUE CROSS/BLUE SHIELD | Admitting: Gastroenterology

## 2016-09-26 ENCOUNTER — Other Ambulatory Visit: Payer: Self-pay

## 2016-09-26 VITALS — BP 148/85 | HR 84 | Temp 98.2°F | Ht 63.0 in | Wt 236.0 lb

## 2016-09-26 DIAGNOSIS — R1013 Epigastric pain: Secondary | ICD-10-CM | POA: Diagnosis not present

## 2016-09-26 DIAGNOSIS — K219 Gastro-esophageal reflux disease without esophagitis: Secondary | ICD-10-CM | POA: Diagnosis not present

## 2016-09-26 LAB — HIV ANTIBODY (ROUTINE TESTING W REFLEX): HIV: NONREACTIVE

## 2016-09-26 LAB — HEMOGLOBIN A1C
Hgb A1c MFr Bld: 5.9 % — ABNORMAL HIGH (ref <5.7)
MEAN PLASMA GLUCOSE: 123 mg/dL

## 2016-09-26 LAB — VITAMIN D 25 HYDROXY (VIT D DEFICIENCY, FRACTURES): Vit D, 25-Hydroxy: 25 ng/mL — ABNORMAL LOW (ref 30–100)

## 2016-09-26 MED ORDER — FAMOTIDINE 20 MG PO TABS: 20.0000 mg | ORAL_TABLET | Freq: Two times a day (BID) | ORAL | 2 refills | Status: DC

## 2016-09-26 NOTE — Patient Instructions (Addendum)
Gastroesophageal Reflux Scan A gastroesophageal reflux scan is a procedure that is used to check for gastroesophageal reflux, which is the backward flow of stomach contents into the tube that carries food from the mouth to the stomach (esophagus). The scan can also show if any stomach contents are inhaled (aspirated) into your lungs. You may need this scan if you have symptoms such as heartburn, vomiting, swallowing problems, or regurgitation. Regurgitation means that swallowed food is returning from the stomach to the esophagus. For this scan, you will drink a liquid that contains a small amount of a radioactive substance (tracer). A scanner with a camera that detects the radioactive tracer is used to see if any of the material backs up into your esophagus. Tell a health care provider about:  Any allergies you have.  All medicines you are taking, including vitamins, herbs, eye drops, creams, and over-the-counter medicines.  Any blood disorders you have.  Any surgeries you have had.  Any medical conditions you have.  If you are pregnant or you think that you may be pregnant.  If you are breastfeeding. What are the risks? Generally, this is a safe procedure. However, problems may occur, including:  Exposure to radiation (a small amount).  Allergic reaction to the radioactive substance. This is rare. What happens before the procedure?  Ask your health care provider about changing or stopping your regular medicines. This is especially important if you are taking diabetes medicines or blood thinners.  Follow your health care provider's instructions about eating or drinking restrictions. What happens during the procedure?  You will be asked to drink a liquid that contains a small amount of a radioactive tracer. This liquid will probably be similar to orange juice.  You will assume a position lying on your back.  A series of images will be taken of your esophagus and upper  stomach.  You may be asked to move into different positions to help determine if reflux occurs more often when you are in specific positions.  For adults, an abdominal binder with an inflatable cuff may be placed on the belly (abdomen). This may be used to increase abdominal pressure. More images will be taken to see if the increased pressure causes reflux to occur. The procedure may vary among health care providers and hospitals. What happens after the procedure?  Return to your normal activities and your normal diet as directed by your health care provider.  The radioactive tracer will leave your body over the next few days. Drink enough fluid to keep your urine clear or pale yellow. This will help to flush the tracer out of your body.  It is your responsibility to obtain your test results. Ask your health care provider or the department performing the test when and how you will get your results. This information is not intended to replace advice given to you by your health care provider. Make sure you discuss any questions you have with your health care provider. Document Released: 12/06/2005 Document Revised: 07/09/2016 Document Reviewed: 07/27/2014 Elsevier Interactive Patient Education  2017 Elsevier Inc.  Heartburn Introduction Heartburn is a type of pain or discomfort that can happen in the throat or chest. It is often described as a burning pain. It may also cause a bad taste in the mouth. Heartburn may feel worse when you lie down or bend over. It may be caused by stomach contents that move back up (reflux) into the tube that connects the mouth with the stomach (esophagus). Follow these instructions  at home: Take these actions to lessen your discomfort and to help avoid problems. Diet  Follow a diet as told by your doctor. You may need to avoid foods and drinks such as:  Coffee and tea (with or without caffeine).  Drinks that contain alcohol.  Energy drinks and sports  drinks.  Carbonated drinks or sodas.  Chocolate and cocoa.  Peppermint and mint flavorings.  Garlic and onions.  Horseradish.  Spicy and acidic foods, such as peppers, chili powder, curry powder, vinegar, hot sauces, and BBQ sauce.  Citrus fruit juices and citrus fruits, such as oranges, lemons, and limes.  Tomato-based foods, such as red sauce, chili, salsa, and pizza with red sauce.  Fried and fatty foods, such as donuts, french fries, potato chips, and high-fat dressings.  High-fat meats, such as hot dogs, rib eye steak, sausage, ham, and bacon.  High-fat dairy items, such as whole milk, butter, and cream cheese.  Eat small meals often. Avoid eating large meals.  Avoid drinking large amounts of liquid with your meals.  Avoid eating meals during the 2-3 hours before bedtime.  Avoid lying down right after you eat.  Do not exercise right after you eat. General instructions  Pay attention to any changes in your symptoms.  Take over-the-counter and prescription medicines only as told by your doctor. Do not take aspirin, ibuprofen, or other NSAIDs unless your doctor says it is okay.  Do not use any tobacco products, including cigarettes, chewing tobacco, and e-cigarettes. If you need help quitting, ask your doctor.  Wear loose clothes. Do not wear anything tight around your waist.  Raise (elevate) the head of your bed about 6 inches (15 cm).  Try to lower your stress. If you need help doing this, ask your doctor.  If you are overweight, lose an amount of weight that is healthy for you. Ask your doctor about a safe weight loss goal.  Keep all follow-up visits as told by your doctor. This is important. Contact a doctor if:  You have new symptoms.  You lose weight and you do not know why it is happening.  You have trouble swallowing, or it hurts to swallow.  You have wheezing or a cough that keeps happening.  Your symptoms do not get better with treatment.  You  have heartburn often for more than two weeks. Get help right away if:  You have pain in your arms, neck, jaw, teeth, or back.  You feel sweaty, dizzy, or light-headed.  You have chest pain or shortness of breath.  You throw up (vomit) and your throw up looks like blood or coffee grounds.  Your poop (stool) is bloody or black. This information is not intended to replace advice given to you by your health care provider. Make sure you discuss any questions you have with your health care provider. Document Released: 06/27/2011 Document Revised: 03/22/2016 Document Reviewed: 02/09/2015  2017 Elsevier

## 2016-09-26 NOTE — Progress Notes (Signed)
Gastroenterology Consultation  Referring Provider:     Alba CorySowles, Krichna, MD Primary Care Physician:  Ruel FavorsKrichna F Sowles, MD Primary Gastroenterologist:  Dr. Wyline MoodKiran Kairo Laubacher  Reason for Consultation:     GERD        HPI:   Kathy Price is a 60 y.o. y/o female referred for consultation & management  by Dr. Ruel FavorsKrichna F Sowles, MD.     Reflux: Onset : She says that "it has been going on for a couple of months" better on "antacid" and "very bad " when off it .  Symptoms: She describes it as nausea, heartburn, sour taste in mouth, no nocturnal symptoms, no recent weight gain, she is diabetic, no symptoms of early satiety Recent weight gain: no  Medications: She is on Pepcid 20 mg BID, almost completely resolves her symptoms. She tried pantoprazole and she out in a rash.  Narcotics or anticholinergics use : No  PPI /H2 blockers or Antacid  use and timing :yes as above Dinner time : 6.30  pm , bed time at 8  , sometimes lies down on her side Prior EGD: none  Family history of esophageal cancer:none,  No difficulty swallowing .   She was seen recently seen at the ER for abdominal pain and was prescribed pepcid and has since resolved the pain.   Denies any NSAID use , does not use the Tramadol.  Past Medical History:  Diagnosis Date  . Back pain   . Breathing-related sleep disorder   . CAD (coronary artery disease)   . Diabetes mellitus without complication (HCC)   . History of MI (myocardial infarction)   . Hyperlipidemia   . Obese   . Postablative ovarian failure   . Vitamin D deficiency     Past Surgical History:  Procedure Laterality Date  . ABDOMINAL HYSTERECTOMY      Prior to Admission medications   Medication Sig Start Date End Date Taking? Authorizing Provider  aspirin EC 81 MG tablet Take 1 tablet by mouth daily.    Historical Provider, MD  Cholecalciferol (VITAMIN D3) 2000 UNITS capsule Take 1 capsule by mouth daily. 12/27/09   Historical Provider, MD    Dapagliflozin-Metformin HCl ER (XIGDUO XR) 5-500 MG TB24 Take 1 tablet by mouth daily. 09/25/16   Alba CoryKrichna Sowles, MD  famotidine (PEPCID) 20 MG tablet Take 1 tablet (20 mg total) by mouth 2 (two) times daily. 08/28/16   Emily FilbertJonathan E Williams, MD  lisinopril (PRINIVIL,ZESTRIL) 10 MG tablet Take 1 tablet (10 mg total) by mouth daily. 06/27/16   Alba CoryKrichna Sowles, MD  Multiple Vitamins-Minerals (WOMENS MULTIVITAMIN PO) Take 1 tablet by mouth.     Historical Provider, MD  ondansetron (ZOFRAN ODT) 4 MG disintegrating tablet Take 1 tablet (4 mg total) by mouth every 8 (eight) hours as needed for nausea or vomiting. Patient not taking: Reported on 09/25/2016 08/28/16   Emily FilbertJonathan E Williams, MD  simvastatin (ZOCOR) 40 MG tablet Take 1 tablet (40 mg total) by mouth daily. 06/27/16   Alba CoryKrichna Sowles, MD  traMADol (ULTRAM) 50 MG tablet Take 1 tablet (50 mg total) by mouth every 6 (six) hours as needed. Patient not taking: Reported on 09/25/2016 08/28/16 08/28/17  Emily FilbertJonathan E Williams, MD    Family History  Problem Relation Age of Onset  . Diabetes Mother   . Hypertension Mother   . Cancer Father   . CAD Brother   . Breast cancer Sister [id number]     Social History  Substance Use Topics  .  Smoking status: Former Smoker    Packs/day: 1.00    Years: 25.00    Types: Cigarettes    Quit date: 09/06/2000  . Smokeless tobacco: Never Used  . Alcohol use No    Allergies as of 09/26/2016 - Review Complete 09/25/2016  Allergen Reaction Noted  . Metoprolol  09/07/2015  . Atorvastatin Rash 09/07/2015  . Pantoprazole Rash 09/25/2016  . Sulfa antibiotics Rash and Hives 09/07/2015    Review of Systems:    All systems reviewed and negative except where noted in HPI.   Physical Exam:  There were no vitals taken for this visit. No LMP recorded. Patient has had a hysterectomy. Psych:  Alert and cooperative. Normal mood and affect. General:   Alert,  Well-developed, well-nourished, obese , pleasant and cooperative in  NAD Head:  Normocephalic and atraumatic. Eyes:  Sclera clear, no icterus.   Conjunctiva pink. Ears:  Normal auditory acuity. Nose:  No deformity, discharge, or lesions. Mouth:  No deformity or lesions,oropharynx pink & moist. Neck:  Short,Supple; no masses or thyromegaly. Lungs:  Respirations even and unlabored.  Clear throughout to auscultation.   No wheezes, crackles, or rhonchi. No acute distress. Heart:  Regular rate and rhythm; no murmurs, clicks, rubs, or gallops. Abdomen:  Normal bowel sounds.  No bruits.  Soft, non-tender and non-distended without masses, hepatosplenomegaly or hernias noted.  No guarding or rebound tenderness.    Psych:  Alert and cooperative. Normal mood and affect.  Imaging Studies: Dg Abdomen Acute W/chest  Result Date: 08/28/2016 CLINICAL DATA:  Pain in the upper abdomen and below sternum. EXAM: DG ABDOMEN ACUTE W/ 1V CHEST COMPARISON:  None. FINDINGS: Linear density in the left lower chest could represent atelectasis or scarring. Otherwise, the lungs are clear. Heart size is within normal limits. Atherosclerotic calcifications at the aortic arch. Negative for free air. Nonobstructive bowel gas pattern. No large abdominal or pelvic calcifications. IMPRESSION: Negative abdominal radiographs.  No acute cardiopulmonary disease. Focal scarring or atelectasis in the left lung. Electronically Signed   By: Richarda OverlieAdam  Henn M.D.   On: 08/28/2016 12:53    Assessment and Plan:   Kathy Price is a 60 y.o. y/o female has been referred for GERD. She also has features of dyspepsia which seems to have responded to pepcid.Counseled on life style changes,advised on the use of a wedge pillow at night , avoid meals for 2 hours prior to bed time. Weight loss.Discussed the rationale for an endoscopy and she wishes to go ahead  I have discussed alternative options, risks & benefits,  which include, but are not limited to, bleeding, infection, perforation,respiratory complication & drug  reaction.  The patient agrees with this plan & written consent will be obtained.      Follow up in 4 weeks  Dr Wyline MoodKiran Prem Coykendall MD

## 2016-11-02 ENCOUNTER — Encounter: Payer: Self-pay | Admitting: *Deleted

## 2016-11-05 ENCOUNTER — Encounter: Payer: Self-pay | Admitting: *Deleted

## 2016-11-05 ENCOUNTER — Encounter
Admission: RE | Disposition: A | Discharge status: Home / Self Care | Payer: Self-pay | Source: Ambulatory Visit | Attending: Gastroenterology

## 2016-11-05 ENCOUNTER — Ambulatory Visit: Payer: BLUE CROSS/BLUE SHIELD | Admitting: Certified Registered"

## 2016-11-05 ENCOUNTER — Ambulatory Visit
Admission: RE | Admit: 2016-11-05 | Discharge: 2016-11-05 | Disposition: A | Discharge status: Home / Self Care | Payer: BLUE CROSS/BLUE SHIELD | Source: Ambulatory Visit | Attending: Gastroenterology | Admitting: Gastroenterology

## 2016-11-05 DIAGNOSIS — Z79899 Other long term (current) drug therapy: Secondary | ICD-10-CM | POA: Diagnosis not present

## 2016-11-05 DIAGNOSIS — Z6841 Body Mass Index (BMI) 40.0 and over, adult: Secondary | ICD-10-CM | POA: Insufficient documentation

## 2016-11-05 DIAGNOSIS — E559 Vitamin D deficiency, unspecified: Secondary | ICD-10-CM | POA: Insufficient documentation

## 2016-11-05 DIAGNOSIS — Z7984 Long term (current) use of oral hypoglycemic drugs: Secondary | ICD-10-CM | POA: Diagnosis not present

## 2016-11-05 DIAGNOSIS — Z87891 Personal history of nicotine dependence: Secondary | ICD-10-CM | POA: Insufficient documentation

## 2016-11-05 DIAGNOSIS — B9681 Helicobacter pylori [H. pylori] as the cause of diseases classified elsewhere: Secondary | ICD-10-CM | POA: Diagnosis not present

## 2016-11-05 DIAGNOSIS — E119 Type 2 diabetes mellitus without complications: Secondary | ICD-10-CM | POA: Insufficient documentation

## 2016-11-05 DIAGNOSIS — Z7982 Long term (current) use of aspirin: Secondary | ICD-10-CM | POA: Diagnosis not present

## 2016-11-05 DIAGNOSIS — I10 Essential (primary) hypertension: Secondary | ICD-10-CM | POA: Insufficient documentation

## 2016-11-05 DIAGNOSIS — I2510 Atherosclerotic heart disease of native coronary artery without angina pectoris: Secondary | ICD-10-CM | POA: Diagnosis not present

## 2016-11-05 DIAGNOSIS — K2970 Gastritis, unspecified, without bleeding: Secondary | ICD-10-CM | POA: Insufficient documentation

## 2016-11-05 DIAGNOSIS — I252 Old myocardial infarction: Secondary | ICD-10-CM | POA: Diagnosis not present

## 2016-11-05 DIAGNOSIS — R1013 Epigastric pain: Secondary | ICD-10-CM | POA: Diagnosis not present

## 2016-11-05 DIAGNOSIS — E785 Hyperlipidemia, unspecified: Secondary | ICD-10-CM | POA: Insufficient documentation

## 2016-11-05 HISTORY — PX: ESOPHAGOGASTRODUODENOSCOPY (EGD) WITH PROPOFOL: SHX5813

## 2016-11-05 HISTORY — DX: Acute myocardial infarction, unspecified: I21.9

## 2016-11-05 LAB — GLUCOSE, CAPILLARY: Glucose-Capillary: 116 mg/dL — ABNORMAL HIGH (ref 65–99)

## 2016-11-05 SURGERY — ESOPHAGOGASTRODUODENOSCOPY (EGD) WITH PROPOFOL
Anesthesia: General

## 2016-11-05 MED ORDER — GLYCOPYRROLATE 0.2 MG/ML IJ SOLN
INTRAMUSCULAR | Status: DC | PRN
  Administered 2016-11-05: 0.2 mg via INTRAVENOUS

## 2016-11-05 MED ORDER — PROPOFOL 10 MG/ML IV BOLUS
INTRAVENOUS | Status: DC | PRN
  Administered 2016-11-05: 70 mg via INTRAVENOUS
  Administered 2016-11-05: 30 mg via INTRAVENOUS

## 2016-11-05 MED ORDER — SODIUM CHLORIDE 0.9 % IV SOLN
INTRAVENOUS | Status: DC
  Administered 2016-11-05: 08:00:00 via INTRAVENOUS

## 2016-11-05 MED ORDER — PROPOFOL 500 MG/50ML IV EMUL
INTRAVENOUS | Status: AC
  Filled 2016-11-05: qty 50

## 2016-11-05 MED ORDER — SODIUM CHLORIDE 0.9 % IV SOLN: INTRAVENOUS | Status: DC

## 2016-11-05 MED ORDER — PROPOFOL 500 MG/50ML IV EMUL
INTRAVENOUS | Status: DC | PRN
  Administered 2016-11-05: 120 ug/kg/min via INTRAVENOUS

## 2016-11-05 NOTE — Transfer of Care (Signed)
Immediate Anesthesia Transfer of Care Note  Patient: Kathy BarriosShelva L Melgoza  Procedure(s) Performed: Procedure(s): ESOPHAGOGASTRODUODENOSCOPY (EGD) WITH PROPOFOL (N/A)  Patient Location: Endoscopy Unit  Anesthesia Type:General  Level of Consciousness: awake and alert   Airway & Oxygen Therapy: Patient Spontanous Breathing and Patient connected to nasal cannula oxygen  Post-op Assessment: Report given to RN and Post -op Vital signs reviewed and stable  Post vital signs: Reviewed  Last Vitals:  Vitals:   11/05/16 0732 11/05/16 0827  BP: (!) 171/97 116/80  Pulse: 77 94  Resp: 16 14  Temp: 36.3 C 36.2 C    Last Pain:  Vitals:   11/05/16 0732  TempSrc: Tympanic         Complications: No apparent anesthesia complications

## 2016-11-05 NOTE — Anesthesia Postprocedure Evaluation (Signed)
Anesthesia Post Note  Patient: Kathy Price  Procedure(s) Performed: Procedure(s) (LRB): ESOPHAGOGASTRODUODENOSCOPY (EGD) WITH PROPOFOL (N/A)  Patient location during evaluation: Endoscopy Anesthesia Type: General Level of consciousness: awake and alert and oriented Pain management: pain level controlled Vital Signs Assessment: post-procedure vital signs reviewed and stable Respiratory status: spontaneous breathing, nonlabored ventilation and respiratory function stable Cardiovascular status: blood pressure returned to baseline and stable Postop Assessment: no signs of nausea or vomiting Anesthetic complications: no     Last Vitals:  Vitals:   11/05/16 0840 11/05/16 0850  BP: (!) 119/92 99/85  Pulse: 84 75  Resp: 20 15  Temp:      Last Pain:  Vitals:   11/05/16 0732  TempSrc: Tympanic                 Harkirat Orozco

## 2016-11-05 NOTE — Op Note (Signed)
[facility] Gastroenterology Patient Name: Kathy Price Procedure Date: 11/05/2016 8:12 AM MRN: [id number] Account #: [id number] Date of Birth: June 11, 1956 Admit Type: Outpatient Age: 61 Room: [facility] ENDO ROOM 4 Gender: Female Note Status: Finalized Procedure:            Upper GI endoscopy Indications:          Dyspepsia, Suspected esophageal reflux Providers:            Wyline Mood MD, MD Referring MD:         Onnie Boer. Sowles, MD (Referring MD) Medicines:            Monitored Anesthesia Care Complications:        No immediate complications. Procedure:            Pre-Anesthesia Assessment:                       - Prior to the procedure, a History and Physical was                        performed, and patient medications, allergies and                        sensitivities were reviewed. The patient's tolerance of                        previous anesthesia was reviewed.                       - The risks and benefits of the procedure and the                        sedation options and risks were discussed with the                        patient. All questions were answered and informed                        consent was obtained.                       - The risks and benefits of the procedure and the                        sedation options and risks were discussed with the                        patient. All questions were answered and informed                        consent was obtained.                       - ASA Grade Assessment: III - A patient with severe                        systemic disease.                       After obtaining informed consent, the endoscope was  passed under direct vision. Throughout the procedure,                        the patient's blood pressure, pulse, and oxygen                        saturations were monitored continuously. The Endoscope                        was introduced through the mouth, and advanced to  the                        third part of duodenum. The upper GI endoscopy was                        accomplished without difficulty. The patient tolerated                        the procedure well. Findings:      The esophagus was normal.      The examined duodenum was normal.      The entire examined stomach was normal. Biopsies were taken with a cold       forceps for histology. Impression:           - Normal esophagus.                       - Normal examined duodenum.                       - Normal stomach. Biopsied. Recommendation:       - Patient has a contact number available for                        emergencies. The signs and symptoms of potential                        delayed complications were discussed with the patient.                        Return to normal activities tomorrow. Written discharge                        instructions were provided to the patient.                       - Resume previous diet.                       - Continue present medications.                       - Await pathology results.                       - No repeat upper endoscopy.                       - Return to GI office as previously scheduled. Procedure Code(s):    --- Professional ---                       [phone], Esophagogastroduodenoscopy, flexible,  transoral;                        with biopsy, single or multiple Diagnosis Code(s):    --- Professional ---                       R10.13, Epigastric pain CPT copyright 2016 American Medical Association. All rights reserved. The codes documented in this report are preliminary and upon coder review may  be revised to meet current compliance requirements. Wyline MoodKiran Lemmie Steinhaus, MD Wyline MoodKiran Francesco Provencal MD, MD 11/05/2016 8:23:38 AM This report has been signed electronically. Number of Addenda: 0 Note Initiated On: 11/05/2016 8:12 AM      [facility]

## 2016-11-05 NOTE — H&P (Signed)
Wyline Mood MD [address] [place], [place] [address] Phone: [phone] Fax : [phone]  Primary Care Physician:  Ruel Favors, MD Primary Gastroenterologist:  Dr. Wyline Mood   Pre-Procedure History & Physical: HPI:  Kathy Price is a 61 y.o. female is here for an endoscopy.   Past Medical History:  Diagnosis Date  . Back pain   . Breathing-related sleep disorder   . CAD (coronary artery disease)   . Diabetes mellitus without complication (HCC)   . History of MI (myocardial infarction)   . Hyperlipidemia   . Myocardial infarction   . Obese   . Postablative ovarian failure   . Vitamin D deficiency     Past Surgical History:  Procedure Laterality Date  . ABDOMINAL HYSTERECTOMY      Prior to Admission medications   Medication Sig Start Date End Date Taking? Authorizing Provider  Cholecalciferol (VITAMIN D3) 2000 UNITS capsule Take 1 capsule by mouth daily. 12/27/09  Yes Historical Provider, MD  Dapagliflozin-Metformin HCl ER (XIGDUO XR) 5-500 MG TB24 Take 1 tablet by mouth daily. 09/25/16  Yes Alba Cory, MD  famotidine (PEPCID) 20 MG tablet Take 1 tablet (20 mg total) by mouth 2 (two) times daily. 09/26/16 11/26/16 Yes Wyline Mood, MD  lisinopril (PRINIVIL,ZESTRIL) 10 MG tablet Take 1 tablet (10 mg total) by mouth daily. 06/27/16  Yes Alba Cory, MD  Multiple Vitamins-Minerals (WOMENS MULTIVITAMIN PO) Take 1 tablet by mouth.    Yes Historical Provider, MD  simvastatin (ZOCOR) 40 MG tablet Take 1 tablet (40 mg total) by mouth daily. 06/27/16  Yes Alba Cory, MD  aspirin EC 81 MG tablet Take 1 tablet by mouth daily.    Historical Provider, MD  ondansetron (ZOFRAN ODT) 4 MG disintegrating tablet Take 1 tablet (4 mg total) by mouth every 8 (eight) hours as needed for nausea or vomiting. Patient not taking: Reported on 09/26/2016 08/28/16   Emily Filbert, MD  traMADol (ULTRAM) 50 MG tablet Take 1 tablet (50 mg total) by mouth every 6 (six) hours  as needed. Patient not taking: Reported on 09/26/2016 08/28/16 08/28/17  Emily Filbert, MD    Allergies as of 09/26/2016 - Review Complete 09/26/2016  Allergen Reaction Noted  . Metoprolol  09/07/2015  . Atorvastatin Rash 09/07/2015  . Pantoprazole Rash 09/25/2016  . Sulfa antibiotics Rash and Hives 09/07/2015    Family History  Problem Relation Age of Onset  . Diabetes Mother   . Hypertension Mother   . Cancer Father   . CAD Brother   . Breast cancer Sister 51    Social History   Social History  . Marital status: Married    Spouse name: N/A  . Number of children: N/A  . Years of education: N/A   Occupational History  . Not on file.   Social History Main Topics  . Smoking status: Former Smoker    Packs/day: 1.00    Years: 25.00    Types: Cigarettes    Quit date: 09/06/2000  . Smokeless tobacco: Never Used  . Alcohol use No  . Drug use: No  . Sexual activity: Yes    Partners: Male   Other Topics Concern  . Not on file   Social History Narrative  . No narrative on file    Review of Systems: See HPI, otherwise negative ROS  Physical Exam: BP (!) 171/97   Pulse 77   Temp 97.4 F (36.3 C) (Tympanic)   Resp 16   Ht  5\' 3"  (1.6 m)   Wt 232 lb (105.2 kg)   SpO2 100%   BMI 41.10 kg/m  General:   Alert,  pleasant and cooperative in NAD Head:  Normocephalic and atraumatic. Neck:  Supple; no masses or thyromegaly. Lungs:  Clear throughout to auscultation.    Heart:  Regular rate and rhythm. Abdomen:  Soft, nontender and nondistended. Normal bowel sounds, without guarding, and without rebound.   Neurologic:  Alert and  oriented x4;  grossly normal neurologically.  Impression/Plan: Kathy Price is here for an endoscopy to be performed for dyspepsia and gerd  Risks, benefits, limitations, and alternatives regarding  endoscopy have been reviewed with the patient.  Questions have been answered.  All parties agreeable.   Wyline MoodKiran Francys Bolin, MD  11/05/2016,  7:36 AM

## 2016-11-05 NOTE — Anesthesia Preprocedure Evaluation (Signed)
Anesthesia Evaluation  Patient identified by MRN, date of birth, ID band Patient awake    Reviewed: Allergy & Precautions, NPO status , Patient's Chart, lab work & pertinent test results  History of Anesthesia Complications Negative for: history of anesthetic complications  Airway Mallampati: III  TM Distance: >3 FB Neck ROM: Full    Dental  (+) Upper Dentures   Pulmonary neg sleep apnea, neg COPD, former smoker,    breath sounds clear to auscultation- rhonchi (-) wheezing      Cardiovascular hypertension, Pt. on medications (-) angina+ CAD, + Past MI (2002) and + Cardiac Stents (2002)   Rhythm:Regular Rate:Normal - Systolic murmurs and - Diastolic murmurs Echo 01/30/16: MILD LV SYSTOLIC DYSFUNCTION WITH MILD LVH MILD VALVULAR REGURGITATION (mild MR, mild TR) NO VALVULAR STENOSIS EF 40-45%  NM stress test 01/30/16: fixed inferior defect consistent with scar from prior MI   Neuro/Psych negative neurological ROS  negative psych ROS   GI/Hepatic negative GI ROS, Neg liver ROS,   Endo/Other  diabetes, Type 2, Oral Hypoglycemic AgentsMorbid obesity  Renal/GU negative Renal ROS     Musculoskeletal negative musculoskeletal ROS (+)   Abdominal (+) + obese,   Peds  Hematology negative hematology ROS (+)   Anesthesia Other Findings Past Medical History: No date: Back pain No date: Breathing-related sleep disorder No date: CAD (coronary artery disease) No date: Diabetes mellitus without complication (HCC) No date: History of MI (myocardial infarction) No date: Hyperlipidemia No date: Myocardial infarction No date: Obese 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 No date: Vitamin D deficiency   Reproductive/Obstetrics                             Anesthesia Physical Anesthesia Plan  ASA: III  Anesthesia Plan: General   Post-op Pain Management:    Induction: Intravenous  Airway Management  Planned: Natural Airway  Additional Equipment:   Intra-op Plan:   Post-operative Plan:   Informed Consent: I have reviewed the patients History and Physical, chart, labs and discussed the procedure including the risks, benefits and alternatives for the proposed anesthesia with the patient or authorized representative who has indicated his/her understanding and acceptance.   Dental advisory given  Plan Discussed with: CRNA and Anesthesiologist  Anesthesia Plan Comments:         Anesthesia Quick Evaluation

## 2016-11-06 ENCOUNTER — Encounter: Payer: Self-pay | Admitting: Family Medicine

## 2016-11-06 ENCOUNTER — Encounter: Payer: Self-pay | Admitting: Gastroenterology

## 2016-11-06 DIAGNOSIS — B9681 Helicobacter pylori [H. pylori] as the cause of diseases classified elsewhere: Secondary | ICD-10-CM | POA: Insufficient documentation

## 2016-11-06 DIAGNOSIS — K2970 Gastritis, unspecified, without bleeding: Secondary | ICD-10-CM

## 2016-11-06 LAB — SURGICAL PATHOLOGY

## 2016-11-08 ENCOUNTER — Ambulatory Visit: Payer: PRIVATE HEALTH INSURANCE

## 2016-11-12 ENCOUNTER — Other Ambulatory Visit: Payer: Self-pay

## 2016-11-12 ENCOUNTER — Telehealth: Payer: Self-pay

## 2016-11-12 MED ORDER — OMEPRAZOLE 20 MG PO CPDR: 20.0000 mg | DELAYED_RELEASE_CAPSULE | Freq: Two times a day (BID) | ORAL | 0 refills | Status: DC

## 2016-11-12 MED ORDER — CLARITHROMYCIN 500 MG PO TABS: 500.0000 mg | ORAL_TABLET | Freq: Two times a day (BID) | ORAL | 0 refills | Status: DC

## 2016-11-12 MED ORDER — AMOXICILLIN 500 MG PO CAPS: 1000.0000 mg | ORAL_CAPSULE | Freq: Two times a day (BID) | ORAL | 0 refills | Status: DC

## 2016-11-12 NOTE — Telephone Encounter (Signed)
-----   Message from Wyline MoodKiran Anna, MD sent at 11/12/2016  8:40 AM EST ----- H pylori gastritis- suggest antibiotics , amoxicillin 1 gram BID, clarithromycin 500mg  BID and omeprazole 20 mg BID, recheck for eradication in 6-12 weeks

## 2016-11-12 NOTE — Telephone Encounter (Signed)
Pt notified of EGD results and rx's. These have been sent to pt's pharmacy.

## 2016-11-15 ENCOUNTER — Ambulatory Visit: Payer: PRIVATE HEALTH INSURANCE

## 2016-11-19 ENCOUNTER — Telehealth: Payer: Self-pay | Admitting: Gastroenterology

## 2016-11-19 NOTE — Telephone Encounter (Signed)
Patient has called and states that she feels as if she is having cramping with abdominal spasms since she has started taking the BIiaxin.   Patient would like for you to call her to discuss if there is any other medication that could be prescribed.   Please call at [phone].

## 2016-11-20 NOTE — Telephone Encounter (Signed)
Pt advised the side effects are from the Biaxin but if she can tolerate until finish that would be great as she needs to complete the h pylori treatment. She will continue taking and let me know if the symptoms are unbearable.

## 2016-11-20 NOTE — Telephone Encounter (Signed)
Please advise regarding Biaxin. Pt is taking this due to H pylori. See below.

## 2016-11-20 NOTE — Telephone Encounter (Signed)
Yes it is a side effect of Biaxin , if possible would like her to continue the antibiotics, if it gets unbearable then she would have to stop and we would need a different antibiotic

## 2016-12-13 ENCOUNTER — Ambulatory Visit
Admission: RE | Admit: 2016-12-13 | Discharge: 2016-12-13 | Disposition: A | Discharge status: Home / Self Care | Payer: BLUE CROSS/BLUE SHIELD | Source: Ambulatory Visit | Attending: Family Medicine | Admitting: Family Medicine

## 2016-12-13 DIAGNOSIS — Z1231 Encounter for screening mammogram for malignant neoplasm of breast: Secondary | ICD-10-CM | POA: Insufficient documentation

## 2016-12-13 DIAGNOSIS — Z1239 Encounter for other screening for malignant neoplasm of breast: Secondary | ICD-10-CM

## 2016-12-21 ENCOUNTER — Other Ambulatory Visit: Payer: Self-pay | Admitting: Family Medicine

## 2016-12-21 DIAGNOSIS — I10 Essential (primary) hypertension: Secondary | ICD-10-CM

## 2016-12-21 DIAGNOSIS — Z9861 Coronary angioplasty status: Principal | ICD-10-CM

## 2016-12-21 DIAGNOSIS — I2510 Atherosclerotic heart disease of native coronary artery without angina pectoris: Secondary | ICD-10-CM

## 2016-12-22 ENCOUNTER — Other Ambulatory Visit: Payer: Self-pay | Admitting: Family Medicine

## 2016-12-26 ENCOUNTER — Ambulatory Visit: Payer: BLUE CROSS/BLUE SHIELD | Admitting: Family Medicine

## 2017-01-14 ENCOUNTER — Other Ambulatory Visit: Payer: Self-pay

## 2017-01-14 DIAGNOSIS — A048 Other specified bacterial intestinal infections: Secondary | ICD-10-CM

## 2017-01-17 ENCOUNTER — Other Ambulatory Visit: Payer: Self-pay | Admitting: Family Medicine

## 2017-01-17 DIAGNOSIS — E785 Hyperlipidemia, unspecified: Secondary | ICD-10-CM

## 2017-01-22 ENCOUNTER — Ambulatory Visit (INDEPENDENT_AMBULATORY_CARE_PROVIDER_SITE_OTHER): Payer: BLUE CROSS/BLUE SHIELD | Admitting: Family Medicine

## 2017-01-22 ENCOUNTER — Encounter: Payer: Self-pay | Admitting: Family Medicine

## 2017-01-22 VITALS — BP 136/84 | HR 83 | Temp 98.5°F | Resp 16 | Ht 63.0 in | Wt 234.6 lb

## 2017-01-22 DIAGNOSIS — K2970 Gastritis, unspecified, without bleeding: Secondary | ICD-10-CM | POA: Diagnosis not present

## 2017-01-22 DIAGNOSIS — Z9861 Coronary angioplasty status: Secondary | ICD-10-CM | POA: Diagnosis not present

## 2017-01-22 DIAGNOSIS — E669 Obesity, unspecified: Secondary | ICD-10-CM

## 2017-01-22 DIAGNOSIS — B9681 Helicobacter pylori [H. pylori] as the cause of diseases classified elsewhere: Secondary | ICD-10-CM

## 2017-01-22 DIAGNOSIS — E1169 Type 2 diabetes mellitus with other specified complication: Secondary | ICD-10-CM

## 2017-01-22 DIAGNOSIS — I2510 Atherosclerotic heart disease of native coronary artery without angina pectoris: Secondary | ICD-10-CM

## 2017-01-22 DIAGNOSIS — E785 Hyperlipidemia, unspecified: Secondary | ICD-10-CM

## 2017-01-22 LAB — POCT UA - MICROALBUMIN: Microalbumin Ur, POC: 20 mg/L

## 2017-01-22 LAB — POCT GLYCOSYLATED HEMOGLOBIN (HGB A1C): Hemoglobin A1C: 6.4

## 2017-01-22 MED ORDER — DAPAGLIFLOZIN PRO-METFORMIN ER 5-500 MG PO TB24: 1.0000 | ORAL_TABLET | Freq: Every day | ORAL | 1 refills | Status: DC

## 2017-01-22 NOTE — Progress Notes (Signed)
Name: Kathy Price   MRN: [id number]    DOB: Jun 24, 1956   Date:01/22/2017       Progress Note  Subjective  Chief Complaint  Chief Complaint  Patient presents with  . Medication Refill    3 month F/U  . Diabetes    Checks once monthly, Average-112-113  . Hypertension    Denies any symptoms  . Hyperlipidemia  . Gastroesophageal Reflux    Improving has seen Gastro and given another medication.    HPI  DMII: she is on Xigduo since August 2017,  hgbA1C was 6.6%, down to 5.9% and up again at 6.4%. Fsbs has been around 113's , she has occasional polyphagia ( craving sweets - it has improved ) she denies polydipsia and polyuria. Eye exam is up to date. No paresthesia, urine micro within normal limits  May 2017. She states she forgets to take medications almost three times a week.    CAD: s/p MI and stent placement 08/2001, seen by Dr. Gwen Pounds 01/2016 and has ischemic cardiomyopathy with left  wall hypokinesis. Stress test only showed old lesion, no acute ischemia She has been taking apirin 81 mg daily, lisinopril and simvastatin. Not on beta-blocker, she had an episode of bradycardia in the past. She denies SOB or diaphoresis. No orthopnea no lower extremity edema.  GERD:  She is back on Omeprazole, she had EGD done by Dr. Tobi Bastos back 10/2016 and was treated for h. Pylori gastritis, feeling better, no longer having indigestion or epigastric pain, heartburn, discussed weaning self off PPI as tolerated, or discuss it with Dr. Tobi Bastos  Morbid Obesity: weight has been stable, slight decrease. Avoiding sweets. Eating more vegetables and baked food.  Hyperlipidemia: taking Simvastatin , denies side effects, last LDL was at goal, can't tolerate Lipitor, and she does not want to switch to Crestor because of cost, we will recheck labs yearly  UARS: did not qualify for CPAP.    Patient Active Problem List   Diagnosis Date Noted  . Helicobacter pylori gastritis 11/06/2016  .  Cardiomyopathy, ischemic 01/30/2016  . CAD S/P percutaneous coronary angioplasty 09/07/2015  . Diabetes mellitus type 2 in obese (HCC) 09/07/2015  . Dyslipidemia 09/07/2015  . H/O: hysterectomy 09/07/2015  . H/O acute myocardial infarction 09/07/2015  . Vitamin D deficiency 09/07/2015  . Morbid obesity (HCC) 09/07/2015  . Bradycardia 05/13/2014  . Arteriosclerosis of coronary artery 05/13/2014  . Essential (primary) hypertension 05/13/2014  . UARS (upper airway resistance syndrome) 01/23/2013    Past Surgical History:  Procedure Laterality Date  . ABDOMINAL HYSTERECTOMY    . ESOPHAGOGASTRODUODENOSCOPY (EGD) WITH PROPOFOL N/A 11/05/2016   Procedure: ESOPHAGOGASTRODUODENOSCOPY (EGD) WITH PROPOFOL;  Surgeon: Wyline Mood, MD;  Location: [facility] ENDOSCOPY;  Service: Endoscopy;  Laterality: N/A;    Family History  Problem Relation Age of Onset  . Diabetes Mother   . Hypertension Mother   . Cancer Father   . CAD Brother   . Breast cancer Sister 16    Social History   Social History  . Marital status: Married    Spouse name: N/A  . Number of children: N/A  . Years of education: N/A   Occupational History  . Not on file.   Social History Main Topics  . Smoking status: Former Smoker    Packs/day: 1.00    Years: 25.00    Types: Cigarettes    Quit date: 09/06/2000  . Smokeless tobacco: Never Used  . Alcohol use No  . Drug use: No  .  Sexual activity: Yes    Partners: Male   Other Topics Concern  . Not on file   Social History Narrative  . No narrative on file     Current Outpatient Prescriptions:  .  aspirin EC 81 MG tablet, Take 1 tablet by mouth daily., Disp: , Rfl:  .  Cholecalciferol (VITAMIN D3) 2000 UNITS capsule, Take 1 capsule by mouth daily., Disp: , Rfl:  .  Dapagliflozin-Metformin HCl ER (XIGDUO XR) 5-500 MG TB24, Take 1 tablet by mouth daily., Disp: 90 tablet, Rfl: 1 .  lisinopril (PRINIVIL,ZESTRIL) 10 MG tablet, TAKE 1 TABLET BY MOUTH DAILY, Disp: 90 tablet,  Rfl: 1 .  Multiple Vitamins-Minerals (WOMENS MULTIVITAMIN PO), Take 1 tablet by mouth. , Disp: , Rfl:  .  omeprazole (PRILOSEC) 20 MG capsule, TAKE 1 CAPSULE (20 MG TOTAL) BY MOUTH 2 (TWO) TIMES DAILY BEFORE A MEAL., Disp: 30 capsule, Rfl: 0 .  simvastatin (ZOCOR) 40 MG tablet, TAKE 1 TABLET BY MOUTH DAILY, Disp: 90 tablet, Rfl: 1 .  traMADol (ULTRAM) 50 MG tablet, Take 1 tablet (50 mg total) by mouth every 6 (six) hours as needed., Disp: 20 tablet, Rfl: 0  Allergies  Allergen Reactions  . Metoprolol Other (See Comments)    severe bradycardia  . Atorvastatin Rash  . Pantoprazole Rash  . Sulfa Antibiotics Rash and Hives     ROS  Constitutional: Negative for fever or weight change.  Respiratory: Negative for cough and shortness of breath.   Cardiovascular: Negative for chest pain or palpitations.  Gastrointestinal: Negative for abdominal pain, no bowel changes.  Musculoskeletal: Negative for gait problem or joint swelling.  Skin: Negative for rash.  Neurological: Negative for dizziness or headache.  No other specific complaints in a complete review of systems (except as listed in HPI above).  Objective  Vitals:   01/22/17 0859  BP: 136/84  Pulse: 83  Resp: 16  Temp: 98.5 F (36.9 C)  TempSrc: Oral  SpO2: 97%  Weight: 234 lb 9.6 oz (106.4 kg)  Height: 5\' 3"  (1.6 m)    Body mass index is 41.56 kg/m.  Physical Exam  Constitutional: Patient appears well-developed and well-nourished. Obese  No distress.  HEENT: head atraumatic, normocephalic, pupils equal and reactive to light, neck supple, throat within normal limits Cardiovascular: Normal rate, regular rhythm and normal heart sounds.  No murmur heard. No BLE edema. Pulmonary/Chest: Effort normal and breath sounds normal. No respiratory distress. Abdominal: Soft.  There is no tenderness. Psychiatric: Patient has a normal mood and affect. behavior is normal. Judgment and thought content normal.  Recent Results (from the  past 2160 hour(s))  Glucose, capillary     Status: Abnormal   Collection Time: 11/05/16  7:38 AM  Result Value Ref Range   Glucose-Capillary 116 (H) 65 - 99 mg/dL   Comment 1 Document in Chart   Surgical pathology     Status: None   Collection Time: 11/05/16  8:17 AM  Result Value Ref Range   SURGICAL PATHOLOGY      Surgical Pathology CASE: [id number] PATIENT: [facility] Surgical Pathology Report     SPECIMEN SUBMITTED: A. Stomach, random gastric; bx's  CLINICAL HISTORY: None provided  PRE-OPERATIVE DIAGNOSIS: GERD K21.9  POST-OPERATIVE DIAGNOSIS: Normal EGD     DIAGNOSIS: A. STOMACH, RANDOM; BIOPSY: - MODERATE CHRONIC ACTIVE HELICOBACTER ASSOCIATED GASTRITIS. - NEGATIVE FOR DYSPLASIA AND MALIGNANCY.   GROSS DESCRIPTION:  A. Labeled: random gastric biopsy  Tissue fragment(s): multiple  Size: aggregate, 0.8 x 0.3 x 0.1  cm  Description: pink-tan fragments  Entirely submitted in 1 cassette(s).    Final Diagnosis performed by Elijah Birkara Rubinas, MD.  Electronically signed 11/06/2016 1:55:15PM    The electronic signature indicates that the named Attending Pathologist has evaluated the specimen  Technical component performed at [facility], [address], [place], [place] [address] Lab: [phone] Dir: Titus DubinWilliam F. Cato MulliganHancock, MD  Professional component performe d at [facility], [facility], [address], [place], [place] [address] Lab: [phone] Dir: Georgiann Cockerara C. Rubinas, MD    POCT HgB A1C     Status: Normal   Collection Time: 01/22/17  8:59 AM  Result Value Ref Range   Hemoglobin A1C 6.4   POCT UA - Microalbumin     Status: Normal   Collection Time: 01/22/17  8:59 AM  Result Value Ref Range   Microalbumin Ur, POC 20 mg/L   Creatinine, POC  mg/dL   Albumin/Creatinine Ratio, Urine, POC       PHQ2/9: Depression screen [facility] 2/9 01/22/2017 09/25/2016 06/27/2016 03/07/2016 09/07/2015  Decreased Interest 0 0 0 0 0  Down, Depressed, Hopeless  0 0 0 0 0  PHQ - 2 Score 0 0 0 0 0     Fall Risk: Fall Risk  01/22/2017 09/25/2016 06/27/2016 03/07/2016 09/07/2015  Falls in the past year? No No No No No     Functional Status Survey: Is the patient deaf or have difficulty hearing?: No Does the patient have difficulty seeing, even when wearing glasses/contacts?: No Does the patient have difficulty concentrating, remembering, or making decisions?: No Does the patient have difficulty walking or climbing stairs?: No Does the patient have difficulty dressing or bathing?: No Does the patient have difficulty doing errands alone such as visiting a doctor's office or shopping?: No    Assessment & Plan  1. Diabetes mellitus type 2 in obese (HCC)  hgbA1C has gone up, she states she forgets to take medication at times, explained that glucose has gone up - POCT HgB A1C - POCT UA - Microalbumin - Dapagliflozin-Metformin HCl ER (XIGDUO XR) 5-500 MG TB24; Take 1 tablet by mouth daily.  Dispense: 90 tablet; Refill: 1  2. Helicobacter pylori gastritis  Seen Dr. Tobi BastosAnna, had EGD, and was treated, advised to have test of cure as recommended by him   3. Morbid obesity (HCC)  Lost 2 lbs, discussed GLP-1 but she wants to hold off for now  4. CAD S/P percutaneous coronary angioplasty  Doing well, continue simvastatin   5. Dyslipidemia  She wants to recheck next visit

## 2017-02-02 ENCOUNTER — Other Ambulatory Visit: Payer: Self-pay | Admitting: Family Medicine

## 2017-02-04 LAB — H. PYLORI ANTIGEN, STOOL: H PYLORI AG STL: NEGATIVE

## 2017-02-04 NOTE — Telephone Encounter (Signed)
Patient requesting refill of Omeprazole to [facility]. 

## 2017-02-12 ENCOUNTER — Other Ambulatory Visit: Payer: Self-pay

## 2017-02-12 ENCOUNTER — Telehealth: Payer: Self-pay

## 2017-02-12 NOTE — Telephone Encounter (Signed)
-----   Message from Wyline Mood, MD sent at 02/11/2017 10:14 AM EDT ----- H pylori stool negative

## 2017-03-28 ENCOUNTER — Other Ambulatory Visit: Payer: Self-pay | Admitting: Gastroenterology

## 2017-04-24 ENCOUNTER — Other Ambulatory Visit: Payer: Self-pay | Admitting: Family Medicine

## 2017-04-25 NOTE — Telephone Encounter (Signed)
Patient requesting refill of Omeprazole to [facility]. 

## 2017-05-20 ENCOUNTER — Other Ambulatory Visit: Payer: Self-pay | Admitting: Family Medicine

## 2017-05-20 NOTE — Telephone Encounter (Signed)
Patient requesting ref[facility].

## 2017-05-24 ENCOUNTER — Ambulatory Visit: Payer: BLUE CROSS/BLUE SHIELD | Admitting: Family Medicine

## 2017-06-28 ENCOUNTER — Ambulatory Visit (INDEPENDENT_AMBULATORY_CARE_PROVIDER_SITE_OTHER): Payer: BLUE CROSS/BLUE SHIELD | Admitting: Family Medicine

## 2017-06-28 ENCOUNTER — Encounter: Payer: Self-pay | Admitting: Family Medicine

## 2017-06-28 VITALS — BP 118/58 | HR 89 | Temp 98.4°F | Resp 16 | Ht 63.0 in | Wt 237.4 lb

## 2017-06-28 DIAGNOSIS — I10 Essential (primary) hypertension: Secondary | ICD-10-CM | POA: Diagnosis not present

## 2017-06-28 DIAGNOSIS — E559 Vitamin D deficiency, unspecified: Secondary | ICD-10-CM

## 2017-06-28 DIAGNOSIS — Z23 Encounter for immunization: Secondary | ICD-10-CM | POA: Diagnosis not present

## 2017-06-28 DIAGNOSIS — Z79899 Other long term (current) drug therapy: Secondary | ICD-10-CM

## 2017-06-28 DIAGNOSIS — M329 Systemic lupus erythematosus, unspecified: Secondary | ICD-10-CM

## 2017-06-28 DIAGNOSIS — E785 Hyperlipidemia, unspecified: Secondary | ICD-10-CM

## 2017-06-28 DIAGNOSIS — I2510 Atherosclerotic heart disease of native coronary artery without angina pectoris: Secondary | ICD-10-CM | POA: Diagnosis not present

## 2017-06-28 DIAGNOSIS — Z9861 Coronary angioplasty status: Secondary | ICD-10-CM

## 2017-06-28 DIAGNOSIS — E1169 Type 2 diabetes mellitus with other specified complication: Secondary | ICD-10-CM

## 2017-06-28 DIAGNOSIS — K219 Gastro-esophageal reflux disease without esophagitis: Secondary | ICD-10-CM

## 2017-06-28 DIAGNOSIS — E669 Obesity, unspecified: Secondary | ICD-10-CM

## 2017-06-28 DIAGNOSIS — IMO0002 Reserved for concepts with insufficient information to code with codable children: Secondary | ICD-10-CM

## 2017-06-28 DIAGNOSIS — Z8739 Personal history of other diseases of the musculoskeletal system and connective tissue: Secondary | ICD-10-CM

## 2017-06-28 LAB — POCT GLYCOSYLATED HEMOGLOBIN (HGB A1C): Hemoglobin A1C: 6.4

## 2017-06-28 MED ORDER — DAPAGLIFLOZIN PRO-METFORMIN ER 5-500 MG PO TB24: 1.0000 | ORAL_TABLET | Freq: Every day | ORAL | 1 refills | Status: DC

## 2017-06-28 MED ORDER — OMEPRAZOLE 20 MG PO CPDR: 20.0000 mg | DELAYED_RELEASE_CAPSULE | Freq: Every day | ORAL | 1 refills | Status: DC

## 2017-06-28 MED ORDER — LISINOPRIL 10 MG PO TABS: 10.0000 mg | ORAL_TABLET | Freq: Every day | ORAL | 1 refills | Status: DC

## 2017-06-28 MED ORDER — SIMVASTATIN 40 MG PO TABS: 40.0000 mg | ORAL_TABLET | Freq: Every day | ORAL | 1 refills | Status: DC

## 2017-06-28 NOTE — Progress Notes (Signed)
Name: Kathy BarriosShelva L Price   MRN: [id number]    DOB: 05-04-1956   Date:06/28/2017       Progress Note  Subjective  Chief Complaint  Chief Complaint  Patient presents with  . Diabetes    4 month follow up checks 2-3 times weekly 111-114 avg  . Obesity    HPI  DMII: she is on Xigduo since August 2017,  hgbA1C was 6.6%, down to 5.9% and up again 6.4% and today is again at 6.4%. Fsbs has been 11-114 She has occasional polyphagia ( craving sweets - it has improved ) she denies polydipsia and polyuria. Eye exam is up to date, urine micro negative 11/2016. No paresthesia,  She has been compliant with medication and denies side effects    CAD: s/p MI and stent placement 08/2001, seen by Dr. Gwen PoundsKowalski 01/2016 and has ischemic cardiomyopathy with left wall hypokinesis. Stress test only showed old lesion, no acute ischemia She has been taking apirin 81 mg daily, lisinopril and simvastatin. Not on beta-blocker, she had an episode of bradycardia in the past. She denies SOB or diaphoresis. No orthopnea no lower extremity edema. She has been walking twice a week for about 20 minutes.   GERD:  She is back on Omeprazole, she had EGD done by Dr. Tobi BastosAnna back 10/2016 and was treated for h. Pylori gastritis, feeling better, no longer having indigestion or epigastric pain, heartburn, discussed weaning self off PPI as tolerated, she is on 20 mg Omeprazole daily and is doing well.   Morbid Obesity:  Avoiding sweets. Eating more vegetables and baked food. Weight is stable  Hyperlipidemia: taking Simvastatin , denies side effects, last LDL was at goal, can't tolerate Lipitor, and she does not want to switch to Crestor because of cost, we will recheck labs today   Patient Active Problem List   Diagnosis Date Noted  . Helicobacter pylori gastritis 11/06/2016  . Cardiomyopathy, ischemic 01/30/2016  . CAD S/P percutaneous coronary angioplasty 09/07/2015  . Diabetes mellitus type 2 in obese (HCC) 09/07/2015  .  Dyslipidemia 09/07/2015  . H/O: hysterectomy 09/07/2015  . H/O acute myocardial infarction 09/07/2015  . Vitamin D deficiency 09/07/2015  . Morbid obesity (HCC) 09/07/2015  . Bradycardia 05/13/2014  . Arteriosclerosis of coronary artery 05/13/2014  . Essential (primary) hypertension 05/13/2014  . UARS (upper airway resistance syndrome) 01/23/2013    Past Surgical History:  Procedure Laterality Date  . ABDOMINAL HYSTERECTOMY    . ESOPHAGOGASTRODUODENOSCOPY (EGD) WITH PROPOFOL N/A 11/05/2016   Procedure: ESOPHAGOGASTRODUODENOSCOPY (EGD) WITH PROPOFOL;  Surgeon: Wyline MoodKiran Anna, MD;  Location: [facility] ENDOSCOPY;  Service: Endoscopy;  Laterality: N/A;    Family History  Problem Relation Age of Onset  . Diabetes Mother   . Hypertension Mother   . Cancer Father   . CAD Brother   . Breast cancer Sister 3050    Social History   Social History  . Marital status: Married    Spouse name: N/A  . Number of children: N/A  . Years of education: N/A   Occupational History  . Not on file.   Social History Main Topics  . Smoking status: Former Smoker    Packs/day: 1.00    Years: 25.00    Types: Cigarettes    Quit date: 09/06/2000  . Smokeless tobacco: Never Used  . Alcohol use No  . Drug use: No  . Sexual activity: Yes    Partners: Male   Other Topics Concern  . Not on file   Social History Narrative  .  No narrative on file     Current Outpatient Prescriptions:  .  aspirin EC 81 MG tablet, Take 1 tablet by mouth daily., Disp: , Rfl:  .  Cholecalciferol (VITAMIN D3) 2000 UNITS capsule, Take 1 capsule by mouth daily., Disp: , Rfl:  .  Dapagliflozin-Metformin HCl ER (XIGDUO XR) 5-500 MG TB24, Take 1 tablet by mouth daily., Disp: 90 tablet, Rfl: 1 .  lisinopril (PRINIVIL,ZESTRIL) 10 MG tablet, Take 1 tablet (10 mg total) by mouth daily., Disp: 90 tablet, Rfl: 1 .  Multiple Vitamins-Minerals (WOMENS MULTIVITAMIN PO), Take 1 tablet by mouth. , Disp: , Rfl:  .  omeprazole (PRILOSEC) 20 MG  capsule, Take 1 capsule (20 mg total) by mouth daily., Disp: 90 capsule, Rfl: 1 .  simvastatin (ZOCOR) 40 MG tablet, Take 1 tablet (40 mg total) by mouth daily., Disp: 90 tablet, Rfl: 1  Allergies  Allergen Reactions  . Metoprolol Other (See Comments)    severe bradycardia  . Atorvastatin Rash  . Pantoprazole Rash  . Sulfa Antibiotics Rash and Hives     ROS  Constitutional: Negative for fever or weight change.  Respiratory: Negative for cough and shortness of breath.   Cardiovascular: Negative for chest pain or palpitations.  Gastrointestinal: Negative for abdominal pain, no bowel changes.  Musculoskeletal: Negative for gait problem or joint swelling.  Skin: Negative for rash.  Neurological: Negative for dizziness or headache.  No other specific complaints in a complete review of systems (except as listed in HPI above).  Objective  Vitals:   06/28/17 0904  BP: (!) 118/58  Pulse: 89  Resp: 16  Temp: 98.4 F (36.9 C)  SpO2: 98%  Weight: 237 lb 6 oz (107.7 kg)  Height: 5\' 3"  (1.6 m)    Body mass index is 42.05 kg/m.  Physical Exam  Constitutional: Patient appears well-developed and well-nourished. Obese No distress.  HEENT: head atraumatic, normocephalic, pupils equal and reactive to light, neck supple, throat within normal limits Cardiovascular: Normal rate, regular rhythm and normal heart sounds.  No murmur heard. No BLE edema. Pulmonary/Chest: Effort normal and breath sounds normal. No respiratory distress. Abdominal: Soft.  There is no tenderness. Psychiatric: Patient has a normal mood and affect. behavior is normal. Judgment and thought content normal.  Recent Results (from the past 2160 hour(s))  POCT HgB A1C     Status: Abnormal   Collection Time: 06/28/17  9:09 AM  Result Value Ref Range   Hemoglobin A1C 6.4     PHQ2/9: Depression screen [facility] 2/9 06/28/2017 01/22/2017 09/25/2016 06/27/2016 03/07/2016  Decreased Interest 0 0 0 0 0  Down, Depressed, Hopeless  0 0 0 0 0  PHQ - 2 Score 0 0 0 0 0    Fall Risk: Fall Risk  06/28/2017 01/22/2017 09/25/2016 06/27/2016 03/07/2016  Falls in the past year? No No No No No     Assessment & Plan  1. Diabetes mellitus type 2 in obese (HCC)  - POCT HgB A1C - Dapagliflozin-Metformin HCl ER (XIGDUO XR) 5-500 MG TB24; Take 1 tablet by mouth daily.  Dispense: 90 tablet; Refill: 1  2. Morbid obesity (HCC)  Discussed with the patient the risk posed by an increased BMI. Discussed importance of portion control, calorie counting and at least 150 minutes of physical activity weekly. Avoid sweet beverages and drink more water. Eat at least 6 servings of fruit and vegetables daily   3. CAD S/P percutaneous coronary angioplasty  - lisinopril (PRINIVIL,ZESTRIL) 10 MG tablet; Take 1 tablet (10  mg total) by mouth daily.  Dispense: 90 tablet; Refill: 1  4. Dyslipidemia associated with type 2 diabetes mellitus (HCC)  - simvastatin (ZOCOR) 40 MG tablet; Take 1 tablet (40 mg total) by mouth daily.  Dispense: 90 tablet; Refill: 1 - Lipid panel  5. Vitamin D deficiency  - VITAMIN D 25 Hydroxy (Vit-D Deficiency, Fractures)  6. Essential (primary) hypertension  - lisinopril (PRINIVIL,ZESTRIL) 10 MG tablet; Take 1 tablet (10 mg total) by mouth daily.  Dispense: 90 tablet; Refill: 1  7. Gastroesophageal reflux disease without esophagitis  - omeprazole (PRILOSEC) 20 MG capsule; Take 1 capsule (20 mg total) by mouth daily.  Dispense: 90 capsule; Refill: 1  8. Needs flu shot  - Flu Vaccine QUAD 36+ mos IM  9. Need for vaccination for pneumococcus  - Pneumococcal conjugate vaccine 13-valent IM  10. Long-term use of high-risk medication  - COMPLETE METABOLIC PANEL WITH GFR - Vitamin B12  11. History of lupus  She states about 20 years was seen by Dermatologist and referred to Rheumatologist , but lost to follow up , denies any joint problems or breathing problems, last urine test normal, we will recheck labs -  CBC with Differential/Platelet - Lupus anticoagulant panel - Sedimentation rate - C-reactive protein

## 2017-09-03 ENCOUNTER — Ambulatory Visit: Payer: BLUE CROSS/BLUE SHIELD | Admitting: Family Medicine

## 2017-09-03 ENCOUNTER — Encounter: Payer: Self-pay | Admitting: Family Medicine

## 2017-09-03 VITALS — BP 120/80 | HR 99 | Resp 14 | Ht 63.0 in | Wt 244.2 lb

## 2017-09-03 DIAGNOSIS — I10 Essential (primary) hypertension: Secondary | ICD-10-CM | POA: Diagnosis not present

## 2017-09-03 DIAGNOSIS — M545 Low back pain, unspecified: Secondary | ICD-10-CM

## 2017-09-03 LAB — POCT URINALYSIS DIPSTICK
Bilirubin, UA: NEGATIVE
Blood, UA: NEGATIVE
Glucose, UA: NEGATIVE
KETONES UA: NEGATIVE
LEUKOCYTES UA: NEGATIVE
Nitrite, UA: NEGATIVE
PROTEIN UA: NEGATIVE
Spec Grav, UA: 1.01 (ref 1.010–1.025)
UROBILINOGEN UA: 0.2 U/dL
pH, UA: 5 (ref 5.0–8.0)

## 2017-09-03 MED ORDER — MELOXICAM 15 MG PO TABS: 15.0000 mg | ORAL_TABLET | Freq: Every day | ORAL | 0 refills | Status: DC

## 2017-09-03 NOTE — Patient Instructions (Signed)

## 2017-09-03 NOTE — Progress Notes (Signed)
Name: Kathy BarriosShelva L Price   MRN: [id number]    DOB: 1956-06-01   Date:09/03/2017       Progress Note  Subjective  Chief Complaint  Chief Complaint  Patient presents with  . Back Pain  . Abdominal Pain    HPI  Left lower back pain: she states symptoms started about one week ago. Pain started while walking on the track ( started walking about one month ago) , pain is described as aching , radiates to left lower quadrant, not associated with bowel or bladder incontinence. No rashes. No dysuria, urine odor, frequency or urgency. No hematuria. Last intercourse about 2 months with husband. No vaginal discharge, s/p hysterectomy ( she still has ovary). She denies change in bowel movements, no nausea or vomiting. Glucose has been under control. No fever or chills.  Pain is worse when she goes from sitting to standing position. Pain at this time is 1-2/10, but can go up to 8/10. Taking Tylenol prn, about once a day.  No weakness or tingling or numbness. No previous history of kidney stone, had back in past but not this severe.    Patient Active Problem List   Diagnosis Date Noted  . Helicobacter pylori gastritis 11/06/2016  . Cardiomyopathy, ischemic 01/30/2016  . CAD S/P percutaneous coronary angioplasty 09/07/2015  . Diabetes mellitus type 2 in obese (HCC) 09/07/2015  . Dyslipidemia 09/07/2015  . H/O: hysterectomy 09/07/2015  . H/O acute myocardial infarction 09/07/2015  . Vitamin D deficiency 09/07/2015  . Morbid obesity (HCC) 09/07/2015  . Bradycardia 05/13/2014  . Arteriosclerosis of coronary artery 05/13/2014  . Essential (primary) hypertension 05/13/2014  . UARS (upper airway resistance syndrome) 01/23/2013    Past Surgical History:  Procedure Laterality Date  . ABDOMINAL HYSTERECTOMY      Family History  Problem Relation Age of Onset  . Diabetes Mother   . Hypertension Mother   . Cancer Father   . CAD Brother   . Breast cancer Sister 3050    Social History   Socioeconomic  History  . Marital status: Married    Spouse name: Not on file  . Number of children: Not on file  . Years of education: Not on file  . Highest education level: Not on file  Social Needs  . Financial resource strain: Not on file  . Food insecurity - worry: Not on file  . Food insecurity - inability: Not on file  . Transportation needs - medical: Not on file  . Transportation needs - non-medical: Not on file  Occupational History  . Not on file  Tobacco Use  . Smoking status: Former Smoker    Packs/day: 1.00    Years: 25.00    Pack years: 25.00    Types: Cigarettes    Last attempt to quit: 09/06/2000    Years since quitting: 17.0  . Smokeless tobacco: Never Used  Substance and Sexual Activity  . Alcohol use: No    Alcohol/week: 0.0 oz  . Drug use: No  . Sexual activity: Yes    Partners: Male  Other Topics Concern  . Not on file  Social History Narrative  . Not on file     Current Outpatient Medications:  .  aspirin EC 81 MG tablet, Take 1 tablet by mouth daily., Disp: , Rfl:  .  Cholecalciferol (VITAMIN D3) 2000 UNITS capsule, Take 1 capsule by mouth daily., Disp: , Rfl:  .  Dapagliflozin-Metformin HCl ER (XIGDUO XR) 5-500 MG TB24, Take 1 tablet by mouth  daily., Disp: 90 tablet, Rfl: 1 .  lisinopril (PRINIVIL,ZESTRIL) 10 MG tablet, Take 1 tablet (10 mg total) by mouth daily., Disp: 90 tablet, Rfl: 1 .  Multiple Vitamins-Minerals (WOMENS MULTIVITAMIN PO), Take 1 tablet by mouth. , Disp: , Rfl:  .  omeprazole (PRILOSEC) 20 MG capsule, Take 1 capsule (20 mg total) by mouth daily., Disp: 90 capsule, Rfl: 1 .  simvastatin (ZOCOR) 40 MG tablet, Take 1 tablet (40 mg total) by mouth daily., Disp: 90 tablet, Rfl: 1  Allergies  Allergen Reactions  . Metoprolol Other (See Comments)    severe bradycardia  . Atorvastatin Rash  . Pantoprazole Rash  . Sulfa Antibiotics Rash and Hives     ROS  Ten systems reviewed and is negative except as mentioned in HPI    Objective  Vitals:   09/03/17 1534  BP: 120/80  Pulse: 99  Resp: 14  SpO2: 98%  Weight: 244 lb 3.2 oz (110.8 kg)  Height: 5\' 3"  (1.6 m)    Body mass index is 43.26 kg/m.  Physical Exam  Constitutional: Patient appears well-developed and well-nourished. Obese No distress.  HEENT: head atraumatic, normocephalic, pupils equal and reactive to light,  neck supple, throat within normal limits Cardiovascular: Normal rate, regular rhythm and normal heart sounds.  No murmur heard. No BLE edema. Pulmonary/Chest: Effort normal and breath sounds normal. No respiratory distress. Abdominal: Soft.  There is no tenderness. Normal bowel sounds Muscular skeletal: negative straight leg raise, normal hip exam, pain during palpation of left lower back bur no pain over the spinal processes.  Psychiatric: Patient has a normal mood and affect. behavior is normal. Judgment and thought content normal.  Recent Results (from the past 2160 hour(s))  POCT HgB A1C     Status: Abnormal   Collection Time: 06/28/17  9:09 AM  Result Value Ref Range   Hemoglobin A1C 6.4   POCT Urinalysis Dipstick     Status: Abnormal   Collection Time: 09/03/17  3:41 PM  Result Value Ref Range   Color, UA yellow    Clarity - urine clear    Glucose, UA negative    Bilirubin, UA negative    Ketones, UA negative    Spec Grav, UA 1.010 1.010 - 1.025   Blood, UA negative    pH, UA 5.0 5.0 - 8.0   Protein, UA negative    Urobilinogen, UA 0.2 0.2 or 1.0 E.U./dL   Nitrite, UA neg    Leukocytes, UA Negative Negative      PHQ2/9: Depression screen [facility] 2/9 06/28/2017 01/22/2017 09/25/2016 06/27/2016 03/07/2016  Decreased Interest 0 0 0 0 0  Down, Depressed, Hopeless 0 0 0 0 0  PHQ - 2 Score 0 0 0 0 0     Fall Risk: Fall Risk  09/03/2017 06/28/2017 01/22/2017 09/25/2016 06/27/2016  Falls in the past year? No No No No No      Functional Status Survey: Is the patient deaf or have difficulty hearing?: No Does the patient  have difficulty seeing, even when wearing glasses/contacts?: No Does the patient have difficulty concentrating, remembering, or making decisions?: No Does the patient have difficulty walking or climbing stairs?: No Does the patient have difficulty dressing or bathing?: No Does the patient have difficulty doing errands alone such as visiting a doctor's office or shopping?: No    Assessment & Plan  1. Acute left-sided low back pain without sciatica   Normal ua, normal hip exam, pain during palpation of left lower back, we  will start nsaid's , if no improvement call back for referral to Ortho  - POCT Urinalysis Dipstick - meloxicam (MOBIC) 15 MG tablet; Take 1 tablet (15 mg total) daily by mouth.  Dispense: 30 tablet; Refill: 0   2. Essential (primary) hypertension  Advised to monitor bp, may continue Tylenol with meloxicam, but cannot take any other nsaid's

## 2017-09-27 ENCOUNTER — Encounter: Payer: BLUE CROSS/BLUE SHIELD | Admitting: Family Medicine

## 2017-09-27 ENCOUNTER — Other Ambulatory Visit: Payer: Self-pay

## 2017-09-27 DIAGNOSIS — M545 Low back pain, unspecified: Secondary | ICD-10-CM

## 2017-09-27 NOTE — Telephone Encounter (Signed)
Patient requesting refill of Meloxicam to [facility].  

## 2017-09-27 NOTE — Telephone Encounter (Signed)
Please call patient and ask how she is doing - Dr. Carlynn PurlSowles stated on 09/03/17 that if she does not improve with NSAIDS and exercises, that we will refer to Ortho.    Please let me know if she needs new Mobic Rx and Ortho referral or if she is improving.  Thanks!

## 2017-09-30 MED ORDER — MELOXICAM 15 MG PO TABS: 15.0000 mg | ORAL_TABLET | Freq: Every day | ORAL | 0 refills | Status: DC

## 2017-09-30 NOTE — Telephone Encounter (Signed)
I called patient and she states that she is getting much better. She thinks that she may need just a few more tablets of the Meloxicam and she does not need a referral to ortho.

## 2017-10-08 ENCOUNTER — Other Ambulatory Visit: Payer: Self-pay

## 2017-10-08 DIAGNOSIS — M545 Low back pain, unspecified: Secondary | ICD-10-CM

## 2017-10-08 MED ORDER — MELOXICAM 15 MG PO TABS: 15.0000 mg | ORAL_TABLET | Freq: Every day | ORAL | 0 refills | Status: DC

## 2017-10-08 NOTE — Telephone Encounter (Signed)
Refill request for general medication: Meloxicam 15 mg  Last office visit: 09/03/2017  Last physical exam: 09/25/2016  Follow up visit: 12/26/2017

## 2017-12-26 ENCOUNTER — Ambulatory Visit: Payer: BLUE CROSS/BLUE SHIELD | Admitting: Family Medicine

## 2018-01-15 ENCOUNTER — Encounter: Payer: Self-pay | Admitting: Family Medicine

## 2018-01-15 ENCOUNTER — Ambulatory Visit: Payer: BLUE CROSS/BLUE SHIELD | Admitting: Family Medicine

## 2018-01-15 VITALS — BP 122/82 | HR 81 | Resp 16 | Ht 63.0 in | Wt 245.1 lb

## 2018-01-15 DIAGNOSIS — I10 Essential (primary) hypertension: Secondary | ICD-10-CM

## 2018-01-15 DIAGNOSIS — K219 Gastro-esophageal reflux disease without esophagitis: Secondary | ICD-10-CM | POA: Diagnosis not present

## 2018-01-15 DIAGNOSIS — Z9861 Coronary angioplasty status: Secondary | ICD-10-CM | POA: Diagnosis not present

## 2018-01-15 DIAGNOSIS — E1169 Type 2 diabetes mellitus with other specified complication: Secondary | ICD-10-CM | POA: Diagnosis not present

## 2018-01-15 DIAGNOSIS — I2510 Atherosclerotic heart disease of native coronary artery without angina pectoris: Secondary | ICD-10-CM

## 2018-01-15 DIAGNOSIS — E785 Hyperlipidemia, unspecified: Secondary | ICD-10-CM

## 2018-01-15 DIAGNOSIS — E669 Obesity, unspecified: Secondary | ICD-10-CM

## 2018-01-15 LAB — POCT GLYCOSYLATED HEMOGLOBIN (HGB A1C): Hemoglobin A1C: 7

## 2018-01-15 LAB — POCT UA - MICROALBUMIN: Microalbumin Ur, POC: 20 mg/L

## 2018-01-15 MED ORDER — LISINOPRIL 10 MG PO TABS: 10.0000 mg | ORAL_TABLET | Freq: Every day | ORAL | 1 refills | Status: DC

## 2018-01-15 MED ORDER — SEMAGLUTIDE(0.25 OR 0.5MG/DOS) 2 MG/1.5ML ~~LOC~~ SOPN
0.5000 mg | PEN_INJECTOR | SUBCUTANEOUS | 2 refills | Status: DC
Start: 2018-01-15 — End: 2018-05-05

## 2018-01-15 MED ORDER — EMPAGLIFLOZIN 25 MG PO TABS: 25.0000 mg | ORAL_TABLET | Freq: Every day | ORAL | 0 refills | Status: DC

## 2018-01-15 NOTE — Progress Notes (Signed)
Name: Kathy Price   MRN: [id number]    DOB: 12-13-1955   Date:01/15/2018       Progress Note  Subjective  Chief Complaint  Chief Complaint  Patient presents with  . Medication Refill    6 month F/U  . Diabetes  . Gastroesophageal Reflux  . Hypertension    HPI  DMII: she is on Xigduo since August 2017, hgbA1C was 6.6%, down to 5.9% and up again 6.4%, 6.4% but today is up to 7.0%. Fsbs has been 113 average fasting. She has occasional polyphagia and sometimes has polydipsia. She denies polyuria. Eye exam is up to date, urine micro due today No paresthesia,  She has been compliant with medication and denies side effects Discussed options. She denies family history of thyroid cancer or personal history of pancreatitis.   CAD: s/p MI and stent placement 08/2001, seen by Dr. Gwen Pounds 01/2016 and has ischemic cardiomyopathy with left wall hypokinesis. Stress test only showed old lesion, no acute ischemia She has been taking apirin 81 mg daily, lisinopril and atorvastatin. Not on beta-blocker, she had an episode of bradycardia in the past. She denies SOB or diaphoresis. No orthopnea no lower extremity edema.   GERD: She is back on Omeprazole, she had EGD done by Dr. Tobi Bastos back 10/2016 and was treated for h. Pylori gastritis, feeling better, no longer having indigestion or epigastric pain, heartburn, discussed weaning self off PPI as tolerated, she is on 20 mg Omeprazole every other day and is doing well.   Morbid Obesity:  Not exercising at this time, also craving sweets again, weight is stable, but diabetes is not as controlled. Discussed life style modification. Discussed [employer]   Hyperlipidemia: taking Atorvastatin and is tolerating well.    Patient Active Problem List   Diagnosis Date Noted  . Helicobacter pylori gastritis 11/06/2016  . Cardiomyopathy, ischemic 01/30/2016  . CAD S/P percutaneous coronary angioplasty 09/07/2015  . Diabetes mellitus type 2 in obese  (HCC) 09/07/2015  . Dyslipidemia 09/07/2015  . H/O: hysterectomy 09/07/2015  . H/O acute myocardial infarction 09/07/2015  . Vitamin D deficiency 09/07/2015  . Morbid obesity (HCC) 09/07/2015  . Bradycardia 05/13/2014  . Arteriosclerosis of coronary artery 05/13/2014  . Essential (primary) hypertension 05/13/2014  . UARS (upper airway resistance syndrome) 01/23/2013    Past Surgical History:  Procedure Laterality Date  . ABDOMINAL HYSTERECTOMY    . ESOPHAGOGASTRODUODENOSCOPY (EGD) WITH PROPOFOL N/A 11/05/2016   Procedure: ESOPHAGOGASTRODUODENOSCOPY (EGD) WITH PROPOFOL;  Surgeon: Wyline Mood, MD;  Location: [facility] ENDOSCOPY;  Service: Endoscopy;  Laterality: N/A;    Family History  Problem Relation Age of Onset  . Diabetes Mother   . Hypertension Mother   . Cancer Father   . CAD Brother   . Breast cancer Sister 46    Social History   Socioeconomic History  . Marital status: Married    Spouse name: Not on file  . Number of children: Not on file  . Years of education: Not on file  . Highest education level: Not on file  Social Needs  . Financial resource strain: Not on file  . Food insecurity - worry: Not on file  . Food insecurity - inability: Not on file  . Transportation needs - medical: Not on file  . Transportation needs - non-medical: Not on file  Occupational History  . Not on file  Tobacco Use  . Smoking status: Former Smoker    Packs/day: 1.00    Years: 25.00    Pack years:  25.00    Types: Cigarettes    Last attempt to quit: 09/06/2000    Years since quitting: 17.3  . Smokeless tobacco: Never Used  Substance and Sexual Activity  . Alcohol use: No    Alcohol/week: 0.0 oz  . Drug use: No  . Sexual activity: Yes    Partners: Male  Other Topics Concern  . Not on file  Social History Narrative  . Not on file     Current Outpatient Medications:  .  aspirin EC 81 MG tablet, Take 1 tablet by mouth daily., Disp: , Rfl:  .  atorvastatin (LIPITOR) 40 MG  tablet, Take 1 tablet by mouth daily., Disp: , Rfl:  .  Cholecalciferol (VITAMIN D3) 2000 UNITS capsule, Take 1 capsule by mouth daily., Disp: , Rfl:  .  lisinopril (PRINIVIL,ZESTRIL) 10 MG tablet, Take 1 tablet (10 mg total) by mouth daily., Disp: 90 tablet, Rfl: 1 .  meloxicam (MOBIC) 15 MG tablet, Take 1 tablet (15 mg total) by mouth daily., Disp: 30 tablet, Rfl: 0 .  Multiple Vitamins-Minerals (WOMENS MULTIVITAMIN PO), Take 1 tablet by mouth. , Disp: , Rfl:  .  omeprazole (PRILOSEC) 20 MG capsule, Take 1 capsule (20 mg total) by mouth daily., Disp: 90 capsule, Rfl: 1 .  empagliflozin (JARDIANCE) 25 MG TABS tablet, Take 25 mg by mouth daily., Disp: 90 tablet, Rfl: 0 .  Semaglutide (OZEMPIC) 0.25 or 0.5 MG/DOSE SOPN, Inject 0.5 mg into the skin once a week., Disp: 2 pen, Rfl: 2  Allergies  Allergen Reactions  . Metoprolol Other (See Comments)    severe bradycardia  . Atorvastatin Rash  . Pantoprazole Rash  . Sulfa Antibiotics Rash and Hives     ROS  Constitutional: Negative for fever or weight change.  Respiratory: Negative for cough and shortness of breath.   Cardiovascular: Negative for chest pain or palpitations.  Gastrointestinal: Negative for abdominal pain, no bowel changes.  Musculoskeletal: Negative for gait problem or joint swelling.  Skin: Negative for rash.  Neurological: Negative for dizziness or headache.  No other specific complaints in a complete review of systems (except as listed in HPI above).  Objective  Vitals:   01/15/18 1136  BP: 122/82  Pulse: 81  Resp: 16  SpO2: 98%  Weight: 245 lb 1.6 oz (111.2 kg)  Height: 5\' 3"  (1.6 m)    Body mass index is 43.42 kg/m.  Physical Exam  Constitutional: Patient appears well-developed and well-nourished. Obese  No distress.  HEENT: head atraumatic, normocephalic, pupils equal and reactive to light,neck supple, throat within normal limits Cardiovascular: Normal rate, regular rhythm and normal heart sounds.  No  murmur heard. No BLE edema. Pulmonary/Chest: Effort normal and breath sounds normal. No respiratory distress. Abdominal: Soft.  There is no tenderness. Psychiatric: Patient has a normal mood and affect. behavior is normal. Judgment and thought content normal.  Recent Results (from the past 2160 hour(s))  POCT HgB A1C     Status: Abnormal   Collection Time: 01/15/18 11:37 AM  Result Value Ref Range   Hemoglobin A1C 7.0   POCT UA - Microalbumin     Status: None   Collection Time: 01/15/18 12:34 PM  Result Value Ref Range   Microalbumin Ur, POC 20 mg/L   Creatinine, POC  mg/dL   Albumin/Creatinine Ratio, Urine, POC      Diabetic Foot Exam: Diabetic Foot Exam - Simple   Simple Foot Form Visual Inspection See comments:  Yes Sensation Testing Intact to touch and monofilament  testing bilaterally:  Yes Pulse Check Posterior Tibialis and Dorsalis pulse intact bilaterally:  Yes Comments Thick second toenails.      PHQ2/9: [facility] 2/9 01/15/2018 06/28/2017 01/22/2017 09/25/2016 06/27/2016  Decreased Interest 0 0 0 0 0  Down, Depressed, Hopeless 0 0 0 0 0  PHQ - 2 Score 0 0 0 0 0     Fall Risk: Fall Risk  01/15/2018 09/03/2017 06/28/2017 01/22/2017 09/25/2016  Falls in the past year? No No No No No    Functional Status Survey: Is the patient deaf or have difficulty hearing?: No Does the patient have difficulty seeing, even when wearing glasses/contacts?: No Does the patient have difficulty concentrating, remembering, or making decisions?: No Does the patient have difficulty walking or climbing stairs?: No Does the patient have difficulty dressing or bathing?: No Does the patient have difficulty doing errands alone such as visiting a doctor's office or shopping?: No    Assessment & Plan  1. Diabetes mellitus type 2 in obese (HCC)  - POCT HgB A1C - Semaglutide (OZEMPIC) 0.25 or 0.5 MG/DOSE SOPN; Inject 0.5 mg into the skin once a week.  Dispense: 2 pen; Refill:  2 - empagliflozin (JARDIANCE) 25 MG TABS tablet; Take 25 mg by mouth daily.  Dispense: 90 tablet; Refill: 0  2. CAD S/P percutaneous coronary angioplasty  - lisinopril (PRINIVIL,ZESTRIL) 10 MG tablet; Take 1 tablet (10 mg total) by mouth daily.  Dispense: 90 tablet; Refill: 1  3. Essential (primary) hypertension  - lisinopril (PRINIVIL,ZESTRIL) 10 MG tablet; Take 1 tablet (10 mg total) by mouth daily.  Dispense: 90 tablet; Refill: 1  4. Morbid obesity (HCC)  Discussed with the patient the risk posed by an increased BMI. Discussed importance of portion control, calorie counting and at least 150 minutes of physical activity weekly. Avoid sweet beverages and drink more water. Eat at least 6 servings of fruit and vegetables daily   5. Gastroesophageal reflux disease without esophagitis  Doing well at this time  6. Dyslipidemia associated with type 2 diabetes mellitus (HCC)  On ATorvastatin

## 2018-01-22 LAB — VITAMIN B12: Vitamin B-12: 363 pg/mL (ref 200–1100)

## 2018-01-22 LAB — CBC WITH DIFFERENTIAL/PLATELET
Basophils Absolute: 39 cells/uL (ref 0–200)
Basophils Relative: 0.6 %
Eosinophils Absolute: 221 cells/uL (ref 15–500)
Eosinophils Relative: 3.4 %
HCT: 37 % (ref 35.0–45.0)
Hemoglobin: 12.7 g/dL (ref 11.7–15.5)
Lymphs Abs: 2327 cells/uL (ref 850–3900)
MCH: 27.7 pg (ref 27.0–33.0)
MCHC: 34.3 g/dL (ref 32.0–36.0)
MCV: 80.8 fL (ref 80.0–100.0)
MPV: 10.4 fL (ref 7.5–12.5)
Monocytes Relative: 4.8 %
NEUTROS PCT: 55.4 %
Neutro Abs: 3601 cells/uL (ref 1500–7800)
Platelets: 251 10*3/uL (ref 140–400)
RBC: 4.58 10*6/uL (ref 3.80–5.10)
RDW: 13.8 % (ref 11.0–15.0)
TOTAL LYMPHOCYTE: 35.8 %
WBC mixed population: 312 cells/uL (ref 200–950)
WBC: 6.5 10*3/uL (ref 3.8–10.8)

## 2018-01-22 LAB — LIPID PANEL
CHOL/HDL RATIO: 2.9 (calc) (ref <5.0)
CHOLESTEROL: 136 mg/dL (ref <200)
HDL: 47 mg/dL — AB (ref >50)
LDL Cholesterol (Calc): 68 mg/dL (calc)
NON-HDL CHOLESTEROL (CALC): 89 mg/dL (ref <130)
TRIGLYCERIDES: 125 mg/dL (ref <150)

## 2018-01-22 LAB — COMPLETE METABOLIC PANEL WITH GFR
AG RATIO: 1.5 (calc) (ref 1.0–2.5)
ALT: 13 U/L (ref 6–29)
AST: 14 U/L (ref 10–35)
Albumin: 4.3 g/dL (ref 3.6–5.1)
Alkaline phosphatase (APISO): 76 U/L (ref 33–130)
BILIRUBIN TOTAL: 0.4 mg/dL (ref 0.2–1.2)
BUN/Creatinine Ratio: 12 (calc) (ref 6–22)
BUN: 12 mg/dL (ref 7–25)
CHLORIDE: 107 mmol/L (ref 98–110)
CO2: 28 mmol/L (ref 20–32)
Calcium: 9.9 mg/dL (ref 8.6–10.4)
Creat: 1.04 mg/dL — ABNORMAL HIGH (ref 0.50–0.99)
GFR, Est African American: 67 mL/min/{1.73_m2} (ref >=60)
GFR, Est Non African American: 58 mL/min/{1.73_m2} — ABNORMAL LOW (ref >=60)
GLOBULIN: 2.8 g/dL (ref 1.9–3.7)
Glucose, Bld: 103 mg/dL — ABNORMAL HIGH (ref 65–99)
POTASSIUM: 4.5 mmol/L (ref 3.5–5.3)
SODIUM: 141 mmol/L (ref 135–146)
TOTAL PROTEIN: 7.1 g/dL (ref 6.1–8.1)

## 2018-01-22 LAB — SEDIMENTATION RATE: Sed Rate: 9 mm/h (ref 0–30)

## 2018-01-22 LAB — LUPUS ANTICOAGULANT EVAL W/ REFLEX
PTT LA SCREEN: 31 s (ref <=40)
dRVVT Screen: 32 s (ref <=45)

## 2018-01-22 LAB — VITAMIN D 25 HYDROXY (VIT D DEFICIENCY, FRACTURES): Vit D, 25-Hydroxy: 38 ng/mL (ref 30–100)

## 2018-01-22 LAB — C-REACTIVE PROTEIN: CRP: 7 mg/L (ref <8.0)

## 2018-01-23 ENCOUNTER — Other Ambulatory Visit: Payer: Self-pay | Admitting: Family Medicine

## 2018-01-23 DIAGNOSIS — Z1231 Encounter for screening mammogram for malignant neoplasm of breast: Secondary | ICD-10-CM

## 2018-02-03 ENCOUNTER — Ambulatory Visit
Admission: RE | Admit: 2018-02-03 | Discharge: 2018-02-03 | Disposition: A | Discharge status: Home / Self Care | Payer: BLUE CROSS/BLUE SHIELD | Source: Ambulatory Visit | Attending: Family Medicine | Admitting: Family Medicine

## 2018-02-03 DIAGNOSIS — Z1231 Encounter for screening mammogram for malignant neoplasm of breast: Secondary | ICD-10-CM | POA: Insufficient documentation

## 2018-04-03 LAB — HM DIABETES EYE EXAM

## 2018-04-17 ENCOUNTER — Ambulatory Visit: Payer: BLUE CROSS/BLUE SHIELD | Admitting: Family Medicine

## 2018-05-05 ENCOUNTER — Other Ambulatory Visit: Payer: Self-pay | Admitting: Family Medicine

## 2018-05-05 DIAGNOSIS — E1169 Type 2 diabetes mellitus with other specified complication: Secondary | ICD-10-CM

## 2018-05-05 DIAGNOSIS — E669 Obesity, unspecified: Principal | ICD-10-CM

## 2018-05-05 NOTE — Telephone Encounter (Signed)
Request for diabetes medication. Ozempic   Last office visit pertaining to diabetes: 01/15/2018   Lab Results  Component Value Date   HGBA1C 7.0 01/15/2018     Follow up on 05/27/2018

## 2018-05-27 ENCOUNTER — Encounter: Payer: Self-pay | Admitting: Family Medicine

## 2018-05-27 ENCOUNTER — Ambulatory Visit: Payer: BLUE CROSS/BLUE SHIELD | Admitting: Family Medicine

## 2018-05-27 VITALS — BP 140/80 | HR 88 | Temp 98.8°F | Resp 16 | Ht 63.0 in | Wt 232.2 lb

## 2018-05-27 DIAGNOSIS — E785 Hyperlipidemia, unspecified: Secondary | ICD-10-CM

## 2018-05-27 DIAGNOSIS — I2510 Atherosclerotic heart disease of native coronary artery without angina pectoris: Secondary | ICD-10-CM | POA: Diagnosis not present

## 2018-05-27 DIAGNOSIS — E669 Obesity, unspecified: Secondary | ICD-10-CM | POA: Diagnosis not present

## 2018-05-27 DIAGNOSIS — IMO0002 Reserved for concepts with insufficient information to code with codable children: Secondary | ICD-10-CM

## 2018-05-27 DIAGNOSIS — I10 Essential (primary) hypertension: Secondary | ICD-10-CM

## 2018-05-27 DIAGNOSIS — K219 Gastro-esophageal reflux disease without esophagitis: Secondary | ICD-10-CM

## 2018-05-27 DIAGNOSIS — E559 Vitamin D deficiency, unspecified: Secondary | ICD-10-CM

## 2018-05-27 DIAGNOSIS — E1169 Type 2 diabetes mellitus with other specified complication: Secondary | ICD-10-CM | POA: Diagnosis not present

## 2018-05-27 DIAGNOSIS — Z9861 Coronary angioplasty status: Secondary | ICD-10-CM

## 2018-05-27 DIAGNOSIS — M329 Systemic lupus erythematosus, unspecified: Secondary | ICD-10-CM

## 2018-05-27 LAB — POCT GLYCOSYLATED HEMOGLOBIN (HGB A1C): HEMOGLOBIN A1C: 6.1 % — AB (ref 4.0–5.6)

## 2018-05-27 MED ORDER — LISINOPRIL 10 MG PO TABS: 10.0000 mg | ORAL_TABLET | Freq: Every day | ORAL | 1 refills | Status: DC

## 2018-05-27 MED ORDER — SEMAGLUTIDE(0.25 OR 0.5MG/DOS) 2 MG/1.5ML ~~LOC~~ SOPN: 0.5000 mg | PEN_INJECTOR | SUBCUTANEOUS | 1 refills | Status: DC

## 2018-05-27 MED ORDER — EMPAGLIFLOZIN 25 MG PO TABS: 25.0000 mg | ORAL_TABLET | Freq: Every day | ORAL | 1 refills | Status: DC

## 2018-05-27 NOTE — Progress Notes (Signed)
Name: Kathy Price   MRN: [id number]    DOB: 13-Feb-1956   Date:05/27/2018       Progress Note  Subjective  Chief Complaint  Chief Complaint  Patient presents with  . Follow-up    patient is here for a 3 month f/u  . Diabetes    patient had her eye exam at [facility]  . Coronary Artery Disease  . Gastroesophageal Reflux    no current issues  . Obesity    patient has lost 13lbs since last visit  . Hyperlipidemia    HPI   DMII: she was  on Xigduo since August 2017, hgbA1C was 6.6%, down 5.9% followed by  6.4%, 6.4% and it went up to  to 7.0%,today 6.1% . We changed to Jardiance and Ozempic 12/2017, she has lost 13 lbs. She has not been checking glucose lately. She is trying to eat healthy snacks, smaller portions and feeling well. She denies polyphagia, polydipsia or polyuria. Eye exam is up to date She has been compliant with medication and only side effects is that it curbs her appetite and is causing weight loss.   CAD: s/p MI and stent placement 08/2001, sees  Dr. Gwen Pounds yearly and she  has ischemic cardiomyopathy with left wall hypokinesis. Stress test only showed old lesion, no acute ischemia She has been taking apirin 81 mg daily, lisinopril and atorvastatin. Not on beta-blocker, she had an episode of bradycardia in the past. She denies SOB or diaphoresis. No orthopnea no lower extremity edema. Doing well on current regiment   GERD: She is back on Omeprazole, she had EGD done by Dr. Tobi Bastos back 10/2016 and was treated for h. Pylori gastritis. She is no  longer having indigestion or epigastric pain, heartburn, discussed weaning self off PPI as tolerated,she is on 20 mg Omeprazole, no longer daily   Morbid Obesity: she lost 13 lbs since 12/2017 with Ozempic , she has been walking 20 minutes 3 x a week. Smaller portions, healthy snacks in between.   Hyperlipidemia: taking Atorvastatin and is tolerating well. No muscle aches.      Patient Active Problem List    Diagnosis Date Noted  . Helicobacter pylori gastritis 11/06/2016  . Cardiomyopathy, ischemic 01/30/2016  . CAD S/P percutaneous coronary angioplasty 09/07/2015  . Diabetes mellitus type 2 in obese (HCC) 09/07/2015  . Dyslipidemia 09/07/2015  . H/O: hysterectomy 09/07/2015  . H/O acute myocardial infarction 09/07/2015  . Vitamin D deficiency 09/07/2015  . Morbid obesity (HCC) 09/07/2015  . Bradycardia 05/13/2014  . Arteriosclerosis of coronary artery 05/13/2014  . Essential (primary) hypertension 05/13/2014  . UARS (upper airway resistance syndrome) 01/23/2013    Past Surgical History:  Procedure Laterality Date  . ABDOMINAL HYSTERECTOMY    . ESOPHAGOGASTRODUODENOSCOPY (EGD) WITH PROPOFOL N/A 11/05/2016   Procedure: ESOPHAGOGASTRODUODENOSCOPY (EGD) WITH PROPOFOL;  Surgeon: Wyline Mood, MD;  Location: [facility] ENDOSCOPY;  Service: Endoscopy;  Laterality: N/A;    Family History  Problem Relation Age of Onset  . Diabetes Mother   . Hypertension Mother   . Cancer Father   . CAD Brother   . Breast cancer Sister 51    Social History   Socioeconomic History  . Marital status: Married    Spouse name: [name]  . Number of children: 3  . Years of education: Not on file  . Highest education level: GED or equivalent  Occupational History  . Not on file  Social Needs  . Financial resource strain: Not hard at all  .  Food insecurity:    Worry: Never true    Inability: Never true  . Transportation needs:    Medical: No    Non-medical: No  Tobacco Use  . Smoking status: Former Smoker    Packs/day: 1.00    Years: 25.00    Pack years: 25.00    Types: Cigarettes    Last attempt to quit: 09/06/2000    Years since quitting: 17.7  . Smokeless tobacco: Never Used  Substance and Sexual Activity  . Alcohol use: No    Alcohol/week: 0.0 oz  . Drug use: No  . Sexual activity: Yes    Partners: Male  Lifestyle  . Physical activity:    Days per week: 2 days    Minutes per session: 10 min   . Stress: Not at all  Relationships  . Social connections:    Talks on phone: More than three times a week    Gets together: Three times a week    Attends religious service: More than 4 times per year    Active member of club or organization: Yes    Attends meetings of clubs or organizations: More than 4 times per year    Relationship status: Married  . Intimate partner violence:    Fear of current or ex partner: No    Emotionally abused: No    Physically abused: No    Forced sexual activity: No  Other Topics Concern  . Not on file  Social History Narrative  . Not on file     Current Outpatient Medications:  .  aspirin EC 81 MG tablet, Take 1 tablet by mouth daily., Disp: , Rfl:  .  atorvastatin (LIPITOR) 40 MG tablet, Take 1 tablet by mouth daily., Disp: , Rfl:  .  Cholecalciferol (VITAMIN D3) 2000 UNITS capsule, Take 1 capsule by mouth daily., Disp: , Rfl:  .  empagliflozin (JARDIANCE) 25 MG TABS tablet, Take 25 mg by mouth daily., Disp: 90 tablet, Rfl: 1 .  lisinopril (PRINIVIL,ZESTRIL) 10 MG tablet, Take 1 tablet (10 mg total) by mouth daily., Disp: 90 tablet, Rfl: 1 .  Multiple Vitamins-Minerals (WOMENS MULTIVITAMIN PO), Take 1 tablet by mouth. , Disp: , Rfl:  .  omeprazole (PRILOSEC) 20 MG capsule, Take 1 capsule (20 mg total) by mouth daily., Disp: 90 capsule, Rfl: 1 .  Semaglutide (OZEMPIC) 0.25 or 0.5 MG/DOSE SOPN, Inject 0.5 mg into the skin once a week., Disp: 9 mL, Rfl: 1  Allergies  Allergen Reactions  . Metoprolol Other (See Comments)    severe bradycardia  . Atorvastatin Rash  . Pantoprazole Rash  . Sulfa Antibiotics Rash and Hives     ROS  Constitutional: Negative for fever , positive for  weight change - lost 13 lbs since last visit .  Respiratory: Negative for cough and shortness of breath.   Cardiovascular: Negative for chest pain or palpitations.  Gastrointestinal: Negative for abdominal pain, no bowel changes.  Musculoskeletal: Negative for gait  problem or joint swelling.  Skin: Negative for rash.  Neurological: Negative for dizziness or headache.  No other specific complaints in a complete review of systems (except as listed in HPI above).  Objective  Vitals:   05/27/18 1051  BP: 140/80  Pulse: 88  Resp: 16  Temp: 98.8 F (37.1 C)  TempSrc: Oral  SpO2: 97%  Weight: 232 lb 3.2 oz (105.3 kg)  Height: 5\' 3"  (1.6 m)    Body mass index is 41.13 kg/m.  Physical Exam  Constitutional:  Patient appears well-developed and well-nourished. Obese No distress.  HEENT: head atraumatic, normocephalic, pupils equal and reactive to light,  neck supple, throat within normal limits Cardiovascular: Normal rate, regular rhythm and normal heart sounds.  No murmur heard. No BLE edema. Pulmonary/Chest: Effort normal and breath sounds normal. No respiratory distress. Abdominal: Soft.  There is no tenderness. Psychiatric: Patient has a normal mood and affect. behavior is normal. Judgment and thought content normal.  PHQ2/9: Depression screen [facility] 2/9 05/27/2018 01/15/2018 06/28/2017 01/22/2017 09/25/2016  Decreased Interest 0 0 0 0 0  Down, Depressed, Hopeless 0 0 0 0 0  PHQ - 2 Score 0 0 0 0 0  Altered sleeping 0 - - - -  Tired, decreased energy 0 - - - -  Change in appetite 0 - - - -  Feeling bad or failure about yourself  0 - - - -  Trouble concentrating 0 - - - -  Moving slowly or fidgety/restless 0 - - - -  Suicidal thoughts 0 - - - -  PHQ-9 Score 0 - - - -  Difficult doing work/chores Not difficult at all - - - -     Fall Risk: Fall Risk  05/27/2018 01/15/2018 09/03/2017 06/28/2017 01/22/2017  Falls in the past year? No No No No No     Functional Status Survey: Is the patient deaf or have difficulty hearing?: No Does the patient have difficulty seeing, even when wearing glasses/contacts?: No Does the patient have difficulty concentrating, remembering, or making decisions?: No Does the patient have difficulty walking or climbing  stairs?: No Does the patient have difficulty dressing or bathing?: No Does the patient have difficulty doing errands alone such as visiting a doctor's office or shopping?: No    Assessment & Plan  1. Diabetes mellitus type 2 in obese (HCC)  - POCT glycosylated hemoglobin (Hb A1C) - empagliflozin (JARDIANCE) 25 MG TABS tablet; Take 25 mg by mouth daily.  Dispense: 90 tablet; Refill: 1 - Semaglutide (OZEMPIC) 0.25 or 0.5 MG/DOSE SOPN; Inject 0.5 mg into the skin once a week.  Dispense: 9 mL; Refill: 1  2. CAD S/P percutaneous coronary angioplasty  - lisinopril (PRINIVIL,ZESTRIL) 10 MG tablet; Take 1 tablet (10 mg total) by mouth daily.  Dispense: 90 tablet; Refill: 1  3. Essential (primary) hypertension  - lisinopril (PRINIVIL,ZESTRIL) 10 MG tablet; Take 1 tablet (10 mg total) by mouth daily.  Dispense: 90 tablet; Refill: 1  1. Diabetes mellitus type 2 in obese (HCC)  - POCT glycosylated hemoglobin (Hb A1C) - empagliflozin (JARDIANCE) 25 MG TABS tablet; Take 25 mg by mouth daily.  Dispense: 90 tablet; Refill: 1 - Semaglutide (OZEMPIC) 0.25 or 0.5 MG/DOSE SOPN; Inject 0.5 mg into the skin once a week.  Dispense: 9 mL; Refill: 1  2. CAD S/P percutaneous coronary angioplasty  - lisinopril (PRINIVIL,ZESTRIL) 10 MG tablet; Take 1 tablet (10 mg total) by mouth daily.  Dispense: 90 tablet; Refill: 1  3. Essential (primary) hypertension  bp a little elevated today, but usually normal, we will continue medication and monitor  - lisinopril (PRINIVIL,ZESTRIL) 10 MG tablet; Take 1 tablet (10 mg total) by mouth daily.  Dispense: 90 tablet; Refill: 1  4. Dyslipidemia associated with type 2 diabetes mellitus (HCC)  Continue statin therapy   5. Morbid obesity (HCC)  Doing well on Ozempic lost 13 lbs in the past 4 months   6. Gastroesophageal reflux disease without esophagitis  Under control with otc medication   7. Vitamin D deficiency  Continue vitamin D supplementation   8.  History of lupus (HCC)  Last labs were normal

## 2018-07-03 ENCOUNTER — Telehealth: Payer: Self-pay

## 2018-07-03 NOTE — Telephone Encounter (Signed)
Copied from CRM [id number]. Topic: General - Other >> Jul 03, 2018  9:03 AM Tamela Oddi wrote: Reason for CRM: Patient called to speak with the nurse about getting assistance for her medication Semaglutide (OZEMPIC) 0.25 or 0.5 MG/DOSE SOPN,  Patient stated that she was just laid off and needs some assistance at this time.  CB# [phone].  Should we send this to the Chronic Care Team or put in a C3 referral?

## 2018-07-03 NOTE — Telephone Encounter (Signed)
I prefer changing to generic medication to metformin and glipizide that she can get for under $10 at [facility] list for 90 days

## 2018-07-03 NOTE — Telephone Encounter (Signed)
I contacted this patient to see if it will be ok to send in the generic but there was no answer. A message was left for her to give [place] a call back stating if it is ok or not.

## 2018-08-28 ENCOUNTER — Ambulatory Visit (INDEPENDENT_AMBULATORY_CARE_PROVIDER_SITE_OTHER): Payer: No Typology Code available for payment source | Admitting: Family Medicine

## 2018-08-28 ENCOUNTER — Encounter: Payer: Self-pay | Admitting: Family Medicine

## 2018-08-28 VITALS — BP 128/72 | HR 109 | Temp 98.6°F | Resp 16 | Ht 63.0 in | Wt 227.6 lb

## 2018-08-28 DIAGNOSIS — E1169 Type 2 diabetes mellitus with other specified complication: Secondary | ICD-10-CM

## 2018-08-28 DIAGNOSIS — I2510 Atherosclerotic heart disease of native coronary artery without angina pectoris: Secondary | ICD-10-CM

## 2018-08-28 DIAGNOSIS — K219 Gastro-esophageal reflux disease without esophagitis: Secondary | ICD-10-CM

## 2018-08-28 DIAGNOSIS — Z23 Encounter for immunization: Secondary | ICD-10-CM | POA: Diagnosis not present

## 2018-08-28 DIAGNOSIS — Z9861 Coronary angioplasty status: Secondary | ICD-10-CM

## 2018-08-28 DIAGNOSIS — I10 Essential (primary) hypertension: Secondary | ICD-10-CM | POA: Diagnosis not present

## 2018-08-28 DIAGNOSIS — E559 Vitamin D deficiency, unspecified: Secondary | ICD-10-CM

## 2018-08-28 DIAGNOSIS — E669 Obesity, unspecified: Secondary | ICD-10-CM | POA: Diagnosis not present

## 2018-08-28 DIAGNOSIS — E785 Hyperlipidemia, unspecified: Secondary | ICD-10-CM

## 2018-08-28 LAB — POCT GLYCOSYLATED HEMOGLOBIN (HGB A1C): HbA1c, POC (controlled diabetic range): 6.1 % (ref 0.0–7.0)

## 2018-08-28 MED ORDER — ATORVASTATIN CALCIUM 40 MG PO TABS: 40.0000 mg | ORAL_TABLET | Freq: Every day | ORAL | 1 refills | Status: DC

## 2018-08-28 MED ORDER — EMPAGLIFLOZIN 25 MG PO TABS: 25.0000 mg | ORAL_TABLET | Freq: Every day | ORAL | 1 refills | Status: DC

## 2018-08-28 MED ORDER — LISINOPRIL 10 MG PO TABS: 10.0000 mg | ORAL_TABLET | Freq: Every day | ORAL | 1 refills | Status: DC

## 2018-08-28 NOTE — Progress Notes (Signed)
Name: Kathy Price   MRN: [id number]    DOB: 1955/11/03   Date:08/28/2018       Progress Note  Subjective  Chief Complaint  Chief Complaint  Patient presents with  . Medication Refill    3 month F/U  . Diabetes    Checks BS weekly Highest-130  . Coronary Artery Disease  . Gastroesophageal Reflux  . Hyperlipidemia    HPI  DMII: she was  on Xigduo since August 2017, hgbA1C was 6.6%, down 5.9% followed by  6.4%,6.4%and it went up to  to 7.0%,6.1% and today 6.1%  We changed to [place] and Ozempic 12/2017, she had lost 15 lbs with combo, but lost her ob and is now only on Jardiance again, off Ozempic for at least one month.  She has not been checking glucose lately. She lost her job 05/2018 and has been trying to be more active around her house. She denies polyphagia, polydipsia or polyuria. Eye exam is up to date She has been compliant with Jardiance and only side effects is that it curbs her appetite and is causing weight loss, she lost 5 lbs since last visit .   CAD: s/p MI and stent placement 08/2001, sees  Dr. Gwen Pounds yearly and she  has ischemic cardiomyopathy with left wall hypokinesis. Stress test only showed old lesion, no acute ischemia She has been taking apirin 81 mg daily, lisinopril and atorvastatin. Not on beta-blocker, she had an episode of bradycardia in the past. She denies SOB or diaphoresis, orthopnea or edema.   GERD: She is back on Omeprazole, she had EGD done by Dr. Tobi Bastos back 10/2016 and was treated for h. Pylori gastritis. She is no  longer having indigestion or epigastric pain, heartburn, discussed weaning self off PPI as tolerated,she stopped Omeprazole and only taking Tum or Zantac prn. Episodes are sporadic and caused by dietary indiscretion   Morbid Obesity:she lost 15 lbs since 12/2017 with Ozempic , doing some home exercising now, advised to resume walking daily.  Hyperlipidemia:taking Atorvastatin and is tolerating well.No muscle aches.     Patient Active Problem List   Diagnosis Date Noted  . Helicobacter pylori gastritis 11/06/2016  . Cardiomyopathy, ischemic 01/30/2016  . CAD S/P percutaneous coronary angioplasty 09/07/2015  . Diabetes mellitus type 2 in obese (HCC) 09/07/2015  . Dyslipidemia 09/07/2015  . H/O: hysterectomy 09/07/2015  . H/O acute myocardial infarction 09/07/2015  . Vitamin D deficiency 09/07/2015  . Morbid obesity (HCC) 09/07/2015  . Bradycardia 05/13/2014  . Arteriosclerosis of coronary artery 05/13/2014  . Essential (primary) hypertension 05/13/2014  . UARS (upper airway resistance syndrome) 01/23/2013    Past Surgical History:  Procedure Laterality Date  . ABDOMINAL HYSTERECTOMY    . ESOPHAGOGASTRODUODENOSCOPY (EGD) WITH PROPOFOL N/A 11/05/2016   Procedure: ESOPHAGOGASTRODUODENOSCOPY (EGD) WITH PROPOFOL;  Surgeon: Wyline Mood, MD;  Location: [facility] ENDOSCOPY;  Service: Endoscopy;  Laterality: N/A;    Family History  Problem Relation Age of Onset  . Diabetes Mother   . Hypertension Mother   . Cancer Father   . CAD Brother   . Breast cancer Sister 67    Social History   Socioeconomic History  . Marital status: Married    Spouse name: [name]  . Number of children: 3  . Years of education: Not on file  . Highest education level: GED or equivalent  Occupational History  . Not on file  Social Needs  . Financial resource strain: Not hard at all  . Food insecurity:  Worry: Never true    Inability: Never true  . Transportation needs:    Medical: No    Non-medical: No  Tobacco Use  . Smoking status: Former Smoker    Packs/day: 1.00    Years: 25.00    Pack years: 25.00    Types: Cigarettes    Last attempt to quit: 09/06/2000    Years since quitting: 17.9  . Smokeless tobacco: Never Used  Substance and Sexual Activity  . Alcohol use: No    Alcohol/week: 0.0 standard drinks  . Drug use: No  . Sexual activity: Yes    Partners: Male  Lifestyle  . Physical activity:    Days  per week: 2 days    Minutes per session: 10 min  . Stress: Not at all  Relationships  . Social connections:    Talks on phone: More than three times a week    Gets together: Three times a week    Attends religious service: More than 4 times per year    Active member of club or organization: Yes    Attends meetings of clubs or organizations: More than 4 times per year    Relationship status: Married  . Intimate partner violence:    Fear of current or ex partner: No    Emotionally abused: No    Physically abused: No    Forced sexual activity: No  Other Topics Concern  . Not on file  Social History Narrative  . Not on file     Current Outpatient Medications:  .  aspirin EC 81 MG tablet, Take 1 tablet by mouth daily., Disp: , Rfl:  .  atorvastatin (LIPITOR) 40 MG tablet, Take 1 tablet (40 mg total) by mouth daily., Disp: 90 tablet, Rfl: 1 .  Cholecalciferol (VITAMIN D3) 2000 UNITS capsule, Take 1 capsule by mouth daily., Disp: , Rfl:  .  empagliflozin (JARDIANCE) 25 MG TABS tablet, Take 25 mg by mouth daily., Disp: 90 tablet, Rfl: 1 .  famotidine (PEPCID) 20 MG tablet, TAKE 1 TABLET (20 MG TOTAL) BY MOUTH 2 (TWO) TIMES DAILY., Disp: , Rfl:  .  lisinopril (PRINIVIL,ZESTRIL) 10 MG tablet, Take 1 tablet (10 mg total) by mouth daily., Disp: 90 tablet, Rfl: 1 .  Multiple Vitamins-Minerals (WOMENS MULTIVITAMIN PO), Take 1 tablet by mouth. , Disp: , Rfl:   Allergies  Allergen Reactions  . Metoprolol Other (See Comments)    severe bradycardia  . Pantoprazole Rash  . Sulfa Antibiotics Rash and Hives    I personally reviewed active problem list, medication list, allergies, family history, social history with the patient/caregiver today.   ROS  Constitutional: Negative for fever, positive for  weight change.  Respiratory: Negative for cough and shortness of breath.   Cardiovascular: Negative for chest pain or palpitations.  Gastrointestinal: Negative for abdominal pain, no bowel  changes.  Musculoskeletal: Negative for gait problem or joint swelling.  Skin: Negative for rash.  Neurological: Negative for dizziness or headache.  No other specific complaints in a complete review of systems (except as listed in HPI above).   Objective  Vitals:   08/28/18 0917  BP: 128/72  Pulse: (!) 109  Resp: 16  Temp: 98.6 F (37 C)  TempSrc: Oral  SpO2: 98%  Weight: 227 lb 9.6 oz (103.2 kg)  Height: 5\' 3"  (1.6 m)    Body mass index is 40.32 kg/m.  Physical Exam  Constitutional: Patient appears well-developed and well-nourished. Obese  No distress.  HEENT: head atraumatic, normocephalic,  pupils equal and reactive to light,  neck supple, throat within normal limits Cardiovascular: Normal rate, regular rhythm and normal heart sounds.  No murmur heard. No BLE edema. Pulmonary/Chest: Effort normal and breath sounds normal. No respiratory distress. Abdominal: Soft.  There is no tenderness. Psychiatric: Patient has a normal mood and affect. behavior is normal. Judgment and thought content normal.  PHQ2/9: Depression screen [facility] 2/9 05/27/2018 01/15/2018 06/28/2017 01/22/2017 09/25/2016  Decreased Interest 0 0 0 0 0  Down, Depressed, Hopeless 0 0 0 0 0  PHQ - 2 Score 0 0 0 0 0  Altered sleeping 0 - - - -  Tired, decreased energy 0 - - - -  Change in appetite 0 - - - -  Feeling bad or failure about yourself  0 - - - -  Trouble concentrating 0 - - - -  Moving slowly or fidgety/restless 0 - - - -  Suicidal thoughts 0 - - - -  PHQ-9 Score 0 - - - -  Difficult doing work/chores Not difficult at all - - - -     Fall Risk: Fall Risk  08/28/2018 05/27/2018 01/15/2018 09/03/2017 06/28/2017  Falls in the past year? No No No No No     Functional Status Survey: Is the patient deaf or have difficulty hearing?: No Does the patient have difficulty seeing, even when wearing glasses/contacts?: Yes(reading glasses) Does the patient have difficulty concentrating, remembering, or making  decisions?: No Does the patient have difficulty walking or climbing stairs?: No Does the patient have difficulty dressing or bathing?: No Does the patient have difficulty doing errands alone such as visiting a doctor's office or shopping?: No    Assessment & Plan  1. Diabetes mellitus type 2 in obese (HCC)  - POCT HgB A1C - empagliflozin (JARDIANCE) 25 MG TABS tablet; Take 25 mg by mouth daily.  Dispense: 90 tablet; Refill: 1  2. Need for immunization against influenza  - Flu Vaccine QUAD 6+ mos PF IM (Fluarix Quad PF)  3. CAD S/P percutaneous coronary angioplasty  - lisinopril (PRINIVIL,ZESTRIL) 10 MG tablet; Take 1 tablet (10 mg total) by mouth daily.  Dispense: 90 tablet; Refill: 1  4. Essential (primary) hypertension  - lisinopril (PRINIVIL,ZESTRIL) 10 MG tablet; Take 1 tablet (10 mg total) by mouth daily.  Dispense: 90 tablet; Refill: 1  5. Morbid obesity (HCC)  Discussed with the patient the risk posed by an increased BMI. Discussed importance of portion control, calorie counting and at least 150 minutes of physical activity weekly. Avoid sweet beverages and drink more water. Eat at least 6 servings of fruit and vegetables daily   6. Dyslipidemia associated with type 2 diabetes mellitus (HCC)  Back on atorvastatin and tolerating it well.   7. Gastroesophageal reflux disease without esophagitis  Doing well on prn medication now  8. Vitamin D deficiency  Continue otc vitamin D otc

## 2018-12-26 ENCOUNTER — Ambulatory Visit: Payer: No Typology Code available for payment source | Admitting: Family Medicine

## 2019-01-09 ENCOUNTER — Other Ambulatory Visit: Payer: Self-pay | Admitting: Family Medicine

## 2019-01-09 DIAGNOSIS — Z1231 Encounter for screening mammogram for malignant neoplasm of breast: Secondary | ICD-10-CM

## 2019-01-28 ENCOUNTER — Ambulatory Visit: Payer: No Typology Code available for payment source | Admitting: Family Medicine

## 2019-03-04 ENCOUNTER — Other Ambulatory Visit: Payer: Self-pay

## 2019-03-04 ENCOUNTER — Ambulatory Visit (INDEPENDENT_AMBULATORY_CARE_PROVIDER_SITE_OTHER): Payer: No Typology Code available for payment source | Admitting: Family Medicine

## 2019-03-04 ENCOUNTER — Encounter: Payer: Self-pay | Admitting: Family Medicine

## 2019-03-04 VITALS — BP 112/72 | HR 74 | Ht 63.0 in | Wt 236.0 lb

## 2019-03-04 DIAGNOSIS — I2510 Atherosclerotic heart disease of native coronary artery without angina pectoris: Secondary | ICD-10-CM

## 2019-03-04 DIAGNOSIS — I10 Essential (primary) hypertension: Secondary | ICD-10-CM

## 2019-03-04 DIAGNOSIS — E1169 Type 2 diabetes mellitus with other specified complication: Secondary | ICD-10-CM | POA: Diagnosis not present

## 2019-03-04 DIAGNOSIS — Z9861 Coronary angioplasty status: Secondary | ICD-10-CM

## 2019-03-04 DIAGNOSIS — M329 Systemic lupus erythematosus, unspecified: Secondary | ICD-10-CM

## 2019-03-04 DIAGNOSIS — IMO0002 Reserved for concepts with insufficient information to code with codable children: Secondary | ICD-10-CM

## 2019-03-04 DIAGNOSIS — K219 Gastro-esophageal reflux disease without esophagitis: Secondary | ICD-10-CM

## 2019-03-04 DIAGNOSIS — E785 Hyperlipidemia, unspecified: Secondary | ICD-10-CM

## 2019-03-04 DIAGNOSIS — E669 Obesity, unspecified: Secondary | ICD-10-CM

## 2019-03-04 DIAGNOSIS — I429 Cardiomyopathy, unspecified: Secondary | ICD-10-CM

## 2019-03-04 MED ORDER — EMPAGLIFLOZIN 25 MG PO TABS: 25.0000 mg | ORAL_TABLET | Freq: Every day | ORAL | 1 refills | Status: DC

## 2019-03-04 MED ORDER — ATORVASTATIN CALCIUM 40 MG PO TABS: 40.0000 mg | ORAL_TABLET | Freq: Every day | ORAL | 1 refills | Status: DC

## 2019-03-04 NOTE — Progress Notes (Signed)
Name: Kathy Price   MRN: [id number]    DOB: Jul 14, 1956   Date:03/04/2019       Progress Note  Subjective  Chief Complaint  Chief Complaint  Patient presents with  . Medication Refill  . Diabetes    BS this morning was 115  . Gastroesophageal Reflux  . Hyperlipidemia  . Coronary Artery Disease    I connected with  Kathy BarriosShelva L Mcmaster  on 03/04/19 at  9:40 AM EDT by a video enabled telemedicine application and verified that I am speaking with the correct person using two identifiers.  I discussed the limitations of evaluation and management by telemedicine and the availability of in person appointments. The patient expressed understanding and agreed to proceed. Staff also discussed with the patient that there may be a patient responsible charge related to this service. Patient Location: at home  [facility]  HPI  DMII: Sheryle Hailshewason Xigduo since August 2017, hgbA1C was 6.6%, down 5.9% followed by6.4%,6.4%and it went up toto 7.0%,6.1% , last visit 6.1% , she will return for labs tomorrow. We changed to [place]ardiance and Ozempic 12/2017, she had lost 15 lbs with combo, but lost her job and is now only on Jardiance again, off Ozempic since Fall 2019, she has not been very compliant with her diet lately. She has been checking glucose at home in the mornings has been low 100 , today it was115 . She denies polyphagia, polydipsia or polyuria.Eye exam is up to date She has been compliant with Jardiance and only side effects is that it curbs her appetite but she thinks she has gained some weight since last visit   CAD: s/p MI and stent placement 08/2001, seesDr. Gwen PoundsKowalski yearly, last visit 04/2020andshehas ischemic cardiomyopathy with left wall hypokinesis. Stress test only showed old lesion, no acute ischemia She has been taking apirin 81 mg daily, lisinopril and atorvastatin. Not on beta-blocker, she had an episode of bradycardia in the past. She denies  SOB or diaphoresis, orthopnea or edema. Doing well   Ischemic cardiomyopathy: sees Dr. Gwen PoundsKowalski, recent visit they prescribed Entresto and trying to get forms filled for assistance with cost of medication. Patient is aware to stop Lisinopril when she starts Entresto   INTERPRETATION MOD LV SYSTOLIC DYSFUNCTION WITH AN ESTIMATED EF = 35-40 % NORMAL RIGHT VENTRICULAR SYSTOLIC FUNCTION MILD TRICUSPID AND MITRAL VALVE INSUFFICIENCY TRACE AORTIC VALVE INSUFFICIENCY NO VALVULAR STENOSIS MILD LV ENLARGEMENT MILD LA ENLARGEMENT  GERD:she had EGD done by Dr. Tobi BastosAnna back 10/2016 and was treated for h. Pylori gastritis. She is nolonger having indigestion or epigastric pain, off medications and doing well   Morbid Obesity:doing some home exercising now, uses a stationary bike but has not been walking lately.    Hyperlipidemia:taking Atorvastatin and is tolerating well.No muscle aches.It is time to check labs  Patient Active Problem List   Diagnosis Date Noted  . Helicobacter pylori gastritis 11/06/2016  . Cardiomyopathy, ischemic 01/30/2016  . CAD S/P percutaneous coronary angioplasty 09/07/2015  . Diabetes mellitus type 2 in obese (HCC) 09/07/2015  . Dyslipidemia 09/07/2015  . H/O: hysterectomy 09/07/2015  . H/O acute myocardial infarction 09/07/2015  . Vitamin D deficiency 09/07/2015  . Morbid obesity (HCC) 09/07/2015  . Bradycardia 05/13/2014  . Arteriosclerosis of coronary artery 05/13/2014  . Essential (primary) hypertension 05/13/2014  . UARS (upper airway resistance syndrome) 01/23/2013    Past Surgical History:  Procedure Laterality Date  . ABDOMINAL HYSTERECTOMY    . ESOPHAGOGASTRODUODENOSCOPY (EGD) WITH PROPOFOL N/A 11/05/2016  Procedure: ESOPHAGOGASTRODUODENOSCOPY (EGD) WITH PROPOFOL;  Surgeon: Wyline Mood, MD;  Location: [facility] ENDOSCOPY;  Service: Endoscopy;  Laterality: N/A;    Family History  Problem Relation Age of Onset  . Diabetes Mother   . Hypertension  Mother   . Cancer Father   . CAD Brother   . Breast cancer Sister 12    Social History   Socioeconomic History  . Marital status: Married    Spouse name: [name]  . Number of children: 3  . Years of education: Not on file  . Highest education level: GED or equivalent  Occupational History  . Not on file  Social Needs  . Financial resource strain: Not hard at all  . Food insecurity:    Worry: Never true    Inability: Never true  . Transportation needs:    Medical: No    Non-medical: No  Tobacco Use  . Smoking status: Former Smoker    Packs/day: 1.00    Years: 25.00    Pack years: 25.00    Types: Cigarettes    Last attempt to quit: 09/06/2000    Years since quitting: 18.5  . Smokeless tobacco: Never Used  Substance and Sexual Activity  . Alcohol use: No    Alcohol/week: 0.0 standard drinks  . Drug use: No  . Sexual activity: Yes    Partners: Male  Lifestyle  . Physical activity:    Days per week: 2 days    Minutes per session: 10 min  . Stress: Not at all  Relationships  . Social connections:    Talks on phone: More than three times a week    Gets together: Three times a week    Attends religious service: More than 4 times per year    Active member of club or organization: Yes    Attends meetings of clubs or organizations: More than 4 times per year    Relationship status: Married  . Intimate partner violence:    Fear of current or ex partner: No    Emotionally abused: No    Physically abused: No    Forced sexual activity: No  Other Topics Concern  . Not on file  Social History Narrative  . Not on file     Current Outpatient Medications:  .  aspirin EC 81 MG tablet, Take 1 tablet by mouth daily., Disp: , Rfl:  .  atorvastatin (LIPITOR) 40 MG tablet, Take 1 tablet (40 mg total) by mouth daily., Disp: 90 tablet, Rfl: 1 .  carvedilol (COREG) 3.125 MG tablet, Take by mouth., Disp: , Rfl:  .  Cholecalciferol (VITAMIN D3) 2000 UNITS capsule, Take 1 capsule by  mouth daily., Disp: , Rfl:  .  empagliflozin (JARDIANCE) 25 MG TABS tablet, Take 25 mg by mouth daily., Disp: 90 tablet, Rfl: 1 .  lisinopril (PRINIVIL,ZESTRIL) 10 MG tablet, Take 1 tablet (10 mg total) by mouth daily., Disp: 90 tablet, Rfl: 1 .  Multiple Vitamins-Minerals (WOMENS MULTIVITAMIN PO), Take 1 tablet by mouth. , Disp: , Rfl:  .  sacubitril-valsartan (ENTRESTO) 24-26 MG, Take by mouth., Disp: , Rfl:   Allergies  Allergen Reactions  . Metoprolol Other (See Comments)    severe bradycardia  . Pantoprazole Rash  . Sulfa Antibiotics Rash and Hives    I personally reviewed active problem list, medication list, allergies, family history with the patient/caregiver today.   ROS  Ten systems reviewed and is negative except as mentioned in HPI . She gained 9 lbs since last visit  Objective  Virtual encounter, vitals done at home  Body mass index is 41.81 kg/m.  Physical Exam  Awake, alert and oriented and in no distress  PHQ2/9: Depression screen [facility] 2/9 03/04/2019 05/27/2018 01/15/2018 06/28/2017 01/22/2017  Decreased Interest 0 0 0 0 0  Down, Depressed, Hopeless 0 0 0 0 0  PHQ - 2 Score 0 0 0 0 0  Altered sleeping 0 0 - - -  Tired, decreased energy 0 0 - - -  Change in appetite 0 0 - - -  Feeling bad or failure about yourself  0 0 - - -  Trouble concentrating 0 0 - - -  Moving slowly or fidgety/restless 0 0 - - -  Suicidal thoughts 0 0 - - -  PHQ-9 Score 0 0 - - -  Difficult doing work/chores Not difficult at all Not difficult at all - - -   PHQ-2/9 Result is negative.    Fall Risk: Fall Risk  03/04/2019 08/28/2018 05/27/2018 01/15/2018 09/03/2017  Falls in the past year? 0 No No No No  Number falls in past yr: 0 - - - -  Injury with Fall? 0 - - - -     Assessment & Plan  1. Essential (primary) hypertension  - COMPLETE METABOLIC PANEL WITH GFR  2. CAD S/P percutaneous coronary angioplasty   3. Diabetes mellitus type 2 in obese (HCC)  - COMPLETE METABOLIC  PANEL WITH GFR - Hemoglobin A1c - empagliflozin (JARDIANCE) 25 MG TABS tablet; Take 25 mg by mouth daily.  Dispense: 90 tablet; Refill: 1 - Ambulatory referral to Chronic Care Management Services  4. Dyslipidemia associated with type 2 diabetes mellitus (HCC)  - Lipid panel - atorvastatin (LIPITOR) 40 MG tablet; Take 1 tablet (40 mg total) by mouth daily.  Dispense: 90 tablet; Refill: 1  5. Gastroesophageal reflux disease without esophagitis  Doing well   6. Morbid obesity (HCC)  Discussed with the patient the risk posed by an increased BMI. Discussed importance of portion control, calorie counting and at least 150 minutes of physical activity weekly. Avoid sweet beverages and drink more water. Eat at least 6 servings of fruit and vegetables daily   7. History of lupus (HCC)  Biopsy done on her scalp many years ago and was positive for lupus, last labs negative CRP and Sed rate, no rashes or joint problems  8. Cardiomyopathy, unspecified type (HCC)  Trying to start on entresto  I discussed the assessment and treatment plan with the patient. The patient was provided an opportunity to ask questions and all were answered. The patient agreed with the plan and demonstrated an understanding of the instructions.  The patient was advised to call back or seek an in-person evaluation if the symptoms worsen or if the condition fails to improve as anticipated.  I provided 25 minutes of non-face-to-face time during this encounter.

## 2019-03-06 LAB — COMPLETE METABOLIC PANEL WITH GFR
AG Ratio: 1.6 (calc) (ref 1.0–2.5)
ALT: 13 U/L (ref 6–29)
AST: 14 U/L (ref 10–35)
Albumin: 4.4 g/dL (ref 3.6–5.1)
Alkaline phosphatase (APISO): 77 U/L (ref 37–153)
BUN: 15 mg/dL (ref 7–25)
CO2: 27 mmol/L (ref 20–32)
Calcium: 10.1 mg/dL (ref 8.6–10.4)
Chloride: 106 mmol/L (ref 98–110)
Creat: 0.92 mg/dL (ref 0.50–0.99)
GFR, Est African American: 76 mL/min/{1.73_m2} (ref >=60)
GFR, Est Non African American: 66 mL/min/{1.73_m2} (ref >=60)
Globulin: 2.7 g/dL (calc) (ref 1.9–3.7)
Glucose, Bld: 124 mg/dL — ABNORMAL HIGH (ref 65–99)
Potassium: 4.4 mmol/L (ref 3.5–5.3)
Sodium: 139 mmol/L (ref 135–146)
Total Bilirubin: 0.4 mg/dL (ref 0.2–1.2)
Total Protein: 7.1 g/dL (ref 6.1–8.1)

## 2019-03-06 LAB — LIPID PANEL
Cholesterol: 135 mg/dL (ref <200)
HDL: 51 mg/dL (ref >=50)
LDL Cholesterol (Calc): 61 mg/dL (calc)
Non-HDL Cholesterol (Calc): 84 mg/dL (calc) (ref <130)
Total CHOL/HDL Ratio: 2.6 (calc) (ref <5.0)
Triglycerides: 151 mg/dL — ABNORMAL HIGH (ref <150)

## 2019-03-06 LAB — HEMOGLOBIN A1C
Hgb A1c MFr Bld: 6.2 % of total Hgb — ABNORMAL HIGH (ref <5.7)
Mean Plasma Glucose: 131 (calc)
eAG (mmol/L): 7.3 (calc)

## 2019-03-16 ENCOUNTER — Encounter: Payer: Self-pay | Admitting: *Deleted

## 2019-03-16 ENCOUNTER — Ambulatory Visit: Payer: Self-pay | Admitting: *Deleted

## 2019-03-16 DIAGNOSIS — I10 Essential (primary) hypertension: Secondary | ICD-10-CM

## 2019-03-16 DIAGNOSIS — E1169 Type 2 diabetes mellitus with other specified complication: Secondary | ICD-10-CM

## 2019-03-16 NOTE — Patient Instructions (Signed)
Thank you allowing the Chronic Care Management Team to be a part of your care! It was a pleasure speaking with you today!  1. Please expect a call from the CCM Pharmacist Karalee Height on 03/24/2019 for medication assistance options.      CCM (Chronic Care Management) Team   Yvone Neu RN, BSN Nurse Care Coordinator  [phone]  Karalee Height PharmD  Clinical Pharmacist  [phone]   Larron Armor [address] LCSW Clinical Social Worker [phone]  Goals Addressed   None      The patient verbalized understanding of instructions provided today and declined a print copy of patient instruction materials.   Telephone follow up appointment with CCM team member scheduled for: 03/24/2019

## 2019-03-16 NOTE — Chronic Care Management (AMB) (Signed)
  Chronic Care Management   Note  03/16/2019 Name: DORSEY CHARETTE MRN: [id number] DOB: [birth year]  Kathy Price is a 63 year old female who sees De. Sowled for primary care. Dr. Ancil Boozer asked the CCM team to consult the patient for medication assistance for Jardiance.  Patient's Cardiologist's office is assisting patient with medication assistance forEntrestoPatient has a history of but not limited to Diabetes, Gastroesophageal Reflux. Hyperlipidemia and Coronary Artery Disease. Referral was placed 03/04/19. Patient's last office visit was 03/04/19.  Telephone outreach to patient today to introduce CCM services.  Ms. Macfarlane was given information about Chronic Care Management services today including:  1. CCM service includes personalized support from designated clinical staff supervised by her physician, including individualized plan of care and coordination with other care providers 2. 24/7 contact phone numbers for assistance for urgent and routine care needs. 3. Service will only be billed when office clinical staff spend 20 minutes or more in a month to coordinate care. 4. Only one practitioner may furnish and bill the service in a calendar month. 5. The patient may stop CCM services at any time (effective at the end of the month) by phone call to the office staff. 6. The patient will be responsible for cost sharing (co-pay) of up to 20% of the service fee (after annual deductible is met).  Patient agreed to services and verbal consent obtained.      Plan: Patient scheduled to follow up with the CCM Pharmacist Ruben Reason on 03/24/19.   Elliot Gurney, [place] Administrator, [facility] Care Management [phone]

## 2019-03-20 ENCOUNTER — Ambulatory Visit: Payer: Self-pay | Admitting: Pharmacist

## 2019-03-20 NOTE — Chronic Care Management (AMB) (Signed)
  Chronic Care Management   Note  03/20/2019 Name: Kathy Price MRN: [id number] DOB: 1955-11-24  63 y.o. year old female referred to Chronic Care Management by Dr. Carlynn Purl for medication assistance [name]). . Last office visit with Alba Cory, MD was 03/04/19   Was unable to reach patient via telephone today and have left HIPAA compliant voicemail asking patient to return my call. (unsuccessful outreach #1).  Follow up plan: The CM team will reach out to the patient again over the next 7 days.   Karalee Height, PharmD Clinical Pharmacist [facility] [phone]

## 2019-03-24 ENCOUNTER — Telehealth: Payer: Self-pay

## 2019-03-27 ENCOUNTER — Ambulatory Visit: Payer: Self-pay | Admitting: Pharmacist

## 2019-03-27 ENCOUNTER — Telehealth: Payer: Self-pay

## 2019-03-27 NOTE — Chronic Care Management (AMB) (Addendum)
  Care Management   Note  03/27/2019 Name: Kathy Price MRN: [id number] DOB: 08-23-1956  63 y.o. year old female referred to Chronic Care Management by Dr. Alba Cory for medication assistance [name]). Last office visit with Alba Cory, MD was 03/04/19.  Was unable to reach patient via telephone today and have left HIPAA compliant voicemail asking patient to return my call. (unsuccessful outreach #2).  Karalee Height, PharmD Clinical Pharmacist [facility] [phone]   11:30 AM:   Patient returned call. I have scheduled an initial pharmacy office visit with her on Monday, June 1 at 11:30 AM or thereafter following her appointment with Dr. Gwen Pounds at [facility]. Patient was instructed to bring all medications to appointment and to wear a face mask.   Karalee Height, PharmD Clinical Pharmacist [facility] [phone]

## 2019-03-30 ENCOUNTER — Other Ambulatory Visit: Payer: Self-pay

## 2019-03-30 ENCOUNTER — Ambulatory Visit (INDEPENDENT_AMBULATORY_CARE_PROVIDER_SITE_OTHER): Payer: No Typology Code available for payment source | Admitting: Pharmacist

## 2019-03-30 DIAGNOSIS — E669 Obesity, unspecified: Secondary | ICD-10-CM | POA: Diagnosis not present

## 2019-03-30 DIAGNOSIS — E1169 Type 2 diabetes mellitus with other specified complication: Secondary | ICD-10-CM

## 2019-03-30 DIAGNOSIS — I10 Essential (primary) hypertension: Secondary | ICD-10-CM

## 2019-04-01 ENCOUNTER — Telehealth: Payer: Self-pay

## 2019-04-10 NOTE — Patient Instructions (Signed)
Thank you for meeting with me today! It was my pleasure to be a member of your care team.   1. Please complete your application for Jardiance medication assistance   2. Work on limiting sodium in your diet. We discussed a lot of hidden sources of sodium today.    Goals Addressed            This Visit's Progress   . I need help managing my blood pressure (pt-stated)       Current Barriers:  Marland Kitchen Knowledge Deficits related to basic understanding of hypertension pathophysiology and self care management . Knowledge Deficits related to understanding of medications prescribed for management of hypertension . Film/video editor.   Clinical Pharmacist Goal(s):  Marland Kitchen Over the next  30 days, patient will verbalize understanding of plan for hypertension management . Over the next  30 days, patient will demonstrate improved adherence to prescribed treatment plan for hypertension as evidenced by taking all medications as prescribed, monitoring and recording blood pressure as directed, adhering to low sodium/DASH diet  Interventions:  . Evaluation of current treatment plan related to hypertension self management and patient's adherence to plan as established by provider. . Provided education to patient re: stroke prevention, s/s of heart attack and stroke, DASH diet, complications of uncontrolled blood pressure . Reviewed medications with patient and discussed importance of compliance  Patient Self Care Activities:  . Self administers medications as prescribed . Checks BP and records as discussed . Follows a low sodium diet/DASH diet  Initial goal documentation      . I need help with paying for Jardiance (pt-stated)       Current Barriers:  . financial  Pharmacist Clinical Goal(s): Over the next 14 days, [name] will provide the necessary supplementary documents (proof of out of pocket prescription expenditure, proof of household income) needed for medication assistance applications to  CCM pharmacist.   Interventions: . CCM pharmacist will apply for medication assistance program for Jardiance made by [employer] . Dr.  Alveria Apley office in managing assistance application for Delene Loll (made by [employer])  Patient Self Care Activities:  Marland Kitchen Gather necessary documents needed to apply for medication assistance  Initial goal documentation          Thank you allowing the Chronic Care Management Team to be a part of your care!    Please call a member of the CCM (Chronic Care Management) Team with any questions or case management needs:   Vanetta Mulders, BSN Nurse Care Coordinator  [phone]  Ruben Reason, PharmD  Clinical Pharmacist  [phone]  Elliot Gurney, [place] Clinical Social Worker [phone]

## 2019-04-10 NOTE — Chronic Care Management (AMB) (Signed)
Chronic Care Management   Note  04/10/2019- LATE ENTRY Name: Kathy Price MRN: [id number] DOB: Oct 28, 1956  Subjective:   Does the patient  feel that his/her medications are working for him/her?  yes  Has the patient been experiencing any side effects to the medications prescribed?  no  Does the patient measure his/her own blood glucose at home?  yes   Does the patient measure his/her own blood pressure at home? yes   Does the patient have any problems obtaining medications due to transportation or finances?   yes  Understanding of regimen: good Understanding of indications: excellent Potential of compliance: good  Objective: Lab Results  Component Value Date   CREATININE 0.92 03/05/2019   CREATININE 1.04 (H) 01/20/2018   CREATININE 0.90 08/28/2016    Lab Results  Component Value Date   HGBA1C 6.2 (H) 03/05/2019    Lipid Panel     Component Value Date/Time   CHOL 135 03/05/2019 0759   TRIG 151 (H) 03/05/2019 0759   HDL 51 03/05/2019 0759   CHOLHDL 2.6 03/05/2019 0759   VLDL 45 (H) 09/25/2016 0915   LDLCALC 61 03/05/2019 0759    BP Readings from Last 3 Encounters:  03/04/19 112/72  08/28/18 128/72  05/27/18 140/80    Allergies  Allergen Reactions  . Metoprolol Other (See Comments)    severe bradycardia  . Pantoprazole Rash  . Sulfa Antibiotics Rash and Hives    Medications Reviewed Today    Reviewed by Steele Sizer, MD (Physician) on 03/04/19 at 1012  Med List Status: <None>  Medication Order Taking? Sig Documenting Provider Last Dose Status Informant  aspirin EC 81 MG tablet [id number] Yes Take 1 tablet by mouth daily. [provider] Taking Active Self           Med Note Windle Guard, JENNIFER L   Tue May 15, 2016 10:41 AM)    atorvastatin (LIPITOR) 40 MG tablet [id number] Yes Take 1 tablet (40 mg total) by mouth daily. Steele Sizer, MD  Active   carvedilol (COREG) 3.125 MG tablet [id number] Yes Take 1 tablet by mouth 2 (two) times a day.  Ralene Bathe, MD Taking Active   Cholecalciferol (VITAMIN D3) 2000 UNITS capsule [id number] Yes Take 1 capsule by mouth daily. [provider] Taking Active Self           Med Note Windle Guard, JENNIFER L   Tue May 15, 2016 10:41 AM)    empagliflozin (JARDIANCE) 25 MG TABS tablet [id number] Yes Take 25 mg by mouth daily. Steele Sizer, MD  Active   lisinopril (PRINIVIL,ZESTRIL) 10 MG tablet [id number] Yes Take 1 tablet (10 mg total) by mouth daily. Steele Sizer, MD Taking Active   Multiple Vitamins-Minerals (WOMENS MULTIVITAMIN PO) [id number] Yes Take 1 tablet by mouth.  [provider] Taking Active Self  sacubitril-valsartan (ENTRESTO) 24-26 MG [id number] Yes Take 1 tablet by mouth daily. Ralene Bathe, MD Taking Active            Assessment:   Goals Addressed            This Visit's Progress   . I need help managing my blood pressure (pt-stated)       Current Barriers:  Marland Kitchen Knowledge Deficits related to basic understanding of hypertension pathophysiology and self care management . Knowledge Deficits related to understanding of medications prescribed for management of hypertension . Film/video editor.   Clinical Pharmacist Goal(s):  Marland Kitchen Over the next  30 days, patient will verbalize understanding of  plan for hypertension management . Over the next  30 days, patient will demonstrate improved adherence to prescribed treatment plan for hypertension as evidenced by taking all medications as prescribed, monitoring and recording blood pressure as directed, adhering to low sodium/DASH diet  Interventions:  . Evaluation of current treatment plan related to hypertension self management and patient's adherence to plan as established by provider. . Provided education to patient re: stroke prevention, s/s of heart attack and stroke, DASH diet, complications of uncontrolled blood pressure . Reviewed medications with patient and discussed importance of compliance   Patient Self Care Activities:  . Self administers medications as prescribed . Checks BP and records as discussed . Follows a low sodium diet/DASH diet  Initial goal documentation      . I need help with paying for Jardiance (pt-stated)       Current Barriers:  . financial  Pharmacist Clinical Goal(s): Over the next 14 days, Ms.Marney Doctor. Kress will provide the necessary supplementary documents (proof of out of pocket prescription expenditure, proof of household income) needed for medication assistance applications to CCM pharmacist.   Interventions: . CCM pharmacist will apply for medication assistance program for Jardiance made by [employer] . Dr.  Philemon KingdomKowalski's office in managing assistance application for Sherryll Burgerntresto (made by [employer])  Patient Self Care Activities:  Marland Kitchen. Gather necessary documents needed to apply for medication assistance  Initial goal documentation        Plan: Recommendations discussed with patient - Try to follow the DASH or low salt diet: less than 2000 mg of sodium per day - please gather the documents needed for the Jardiance assistance application and return to [facility] as soon as possible  Follow up: Telephone follow up appointment with care management team member scheduled for:  2 weeks with PharmD for assistance app 30 days with PharmD for HTN/CHF  Karalee HeightJulie Kynesha Guerin, PharmD Clinical Pharmacist [facility] [phone]

## 2019-04-14 ENCOUNTER — Telehealth: Payer: Self-pay

## 2019-04-15 ENCOUNTER — Ambulatory Visit: Payer: Self-pay | Admitting: Pharmacist

## 2019-04-15 ENCOUNTER — Telehealth: Payer: Self-pay

## 2019-04-15 DIAGNOSIS — E1169 Type 2 diabetes mellitus with other specified complication: Secondary | ICD-10-CM

## 2019-04-15 DIAGNOSIS — E669 Obesity, unspecified: Secondary | ICD-10-CM

## 2019-04-15 NOTE — Chronic Care Management (AMB) (Signed)
  Chronic Care Management   Note  04/15/2019 Name: Kathy Price MRN: [id number] DOB: 1956-10-15  63 y.o. year old female referred to Chronic Care Management by Dr. Steele Sizer for medication assistance. . Last office visit with Steele Sizer, MD was 03/04/19. Follow up call with PharmD today to check status of medication assistance applications and supplemental documents.   Was unable to reach patient via telephone today and have left HIPAA compliant voicemail asking patient to return my call. (unsuccessful outreach #1).  Follow up plan: A HIPPA compliant phone message was left for the patient providing contact information and requesting a return call.  The care management team will reach out to the patient again over the next 5-7 days.   Ruben Reason, PharmD Clinical Pharmacist [facility] [phone]

## 2019-04-16 ENCOUNTER — Other Ambulatory Visit: Payer: Self-pay

## 2019-04-16 ENCOUNTER — Ambulatory Visit
Admission: RE | Admit: 2019-04-16 | Discharge: 2019-04-16 | Disposition: A | Discharge status: Home / Self Care | Payer: No Typology Code available for payment source | Source: Ambulatory Visit | Attending: Family Medicine | Admitting: Family Medicine

## 2019-04-16 DIAGNOSIS — Z1231 Encounter for screening mammogram for malignant neoplasm of breast: Secondary | ICD-10-CM | POA: Diagnosis not present

## 2019-04-17 ENCOUNTER — Ambulatory Visit: Payer: Self-pay | Admitting: Pharmacist

## 2019-04-17 DIAGNOSIS — E1169 Type 2 diabetes mellitus with other specified complication: Secondary | ICD-10-CM

## 2019-04-17 DIAGNOSIS — I10 Essential (primary) hypertension: Secondary | ICD-10-CM

## 2019-04-24 NOTE — Chronic Care Management (AMB) (Signed)
  Chronic Care Management   Follow Up Note   04/24/2019- LATE ENTRY Name: Kathy Price MRN: [id number] DOB: December 26, 1955   [name] is a 63 y.o. year old female who is a primary care patient of Steele Sizer, MD. The CCM clinical pharmacist is engaged with patient for medication assistance and HTN management.    Goals Addressed            This Visit's Progress   . I need help with paying for Jardiance (pt-stated)   On track    Current Barriers:  . financial  Pharmacist Clinical Goal(s): Over the next 14 days, [name] will provide the necessary supplementary documents (proof of out of pocket prescription expenditure, proof of household income) needed for medication assistance applications to CCM pharmacist.   Interventions: . CCM pharmacist will apply for medication assistance program for Jardiance made by [employer] o Patient returned application and financial forms o Dr. Ancil Boozer signed provider portion of [facility] Cares application . Dr.  Alveria Apley office in managing assistance application for Delene Loll (made by [employer])  Patient Self Care Activities:  Marland Kitchen Gather necessary documents needed to apply for medication assistance  Initial goal documentation        Follow up: Telephone follow up appointment with care management team member scheduled for: July 14 with PharmD for HTN management;    Ruben Reason, PharmD Clinical Pharmacist [facility] [phone]

## 2019-05-12 ENCOUNTER — Encounter: Payer: Self-pay | Admitting: Pharmacist

## 2019-05-15 NOTE — Progress Notes (Signed)
This encounter was created in error - please disregard.

## 2019-05-19 ENCOUNTER — Ambulatory Visit (INDEPENDENT_AMBULATORY_CARE_PROVIDER_SITE_OTHER): Payer: No Typology Code available for payment source | Admitting: Pharmacist

## 2019-05-19 DIAGNOSIS — E1169 Type 2 diabetes mellitus with other specified complication: Secondary | ICD-10-CM

## 2019-05-19 DIAGNOSIS — I10 Essential (primary) hypertension: Secondary | ICD-10-CM | POA: Diagnosis not present

## 2019-05-19 DIAGNOSIS — E669 Obesity, unspecified: Secondary | ICD-10-CM

## 2019-05-19 NOTE — Chronic Care Management (AMB) (Signed)
  Care Management   Follow Up Note   05/19/2019 Name: Kathy Price MRN: [id number] DOB: 23-May-1956  Subjective Kathy Price is a 63 y.o. year old female who is a primary care patient of Steele Sizer, MD. The care management clinical pharmacist is following up today via telephone for progression of goals: medication assistance (Jardaince) and BP control. HIPAA identifiers verified.   Assessment: Review of patient status, including review of consultants reports, relevant laboratory and other test results, and collaboration with appropriate care team members and the patient's provider was performed as part of comprehensive patient evaluation and provision of chronic care management services.    Goals Addressed            This Visit's Progress   . COMPLETED: I need help managing my blood pressure (pt-stated)       Current Barriers:  Kathy Price Knowledge Deficits related to basic understanding of hypertension pathophysiology and self care management . Knowledge Deficits related to understanding of medications prescribed for management of hypertension . Film/video editor.   Clinical Pharmacist Goal(s):  Kathy Price Over the next  30 days, patient will verbalize understanding of plan for hypertension management . Over the next  30 days, patient will demonstrate improved adherence to prescribed treatment plan for hypertension as evidenced by taking all medications as prescribed, monitoring and recording blood pressure as directed, adhering to low sodium/DASH diet  Interventions:  . Evaluation of current treatment plan related to hypertension self management and patient's adherence to plan as established by provider. . Provided education to patient re: stroke prevention, s/s of heart attack and stroke, DASH diet, complications of uncontrolled blood pressure . Reviewed medications with patient and discussed importance of compliance  Patient Self Care Activities:  . Self administers medications as  prescribed . Checks BP and records as discussed . Follows a low sodium diet/DASH diet  Please see past updates related to this goal by clicking on the "Past Updates" button in the selected goal       . COMPLETED: I need help with paying for Jardiance (pt-stated)       Current Barriers:  . financial  Pharmacist Clinical Goal(s): Over the next 14 days, Kathy Price will provide the necessary supplementary documents (proof of out of pocket prescription expenditure, proof of household income) needed for medication assistance applications to CCM pharmacist.   Interventions: . CCM pharmacist will apply for medication assistance program for Jardiance made by [employer] o Patient returned application and financial forms o Dr. Ancil Boozer signed provider portion of [facility] Cares application . Dr.  Alveria Apley office in managing assistance application for Kathy Price (made by [employer])  Updated 7/21: patient has been approved for Jardiance assistance until 05/18/20  Patient Self Care Activities:  Kathy Price Gather necessary documents needed to apply for medication assistance  Please see past updates related to this goal by clicking on the "Past Updates" button in the selected goal         Follow up: The patient has been provided with contact information for the care management team and has been advised to call with any health related questions or concerns.  Next PCP appointment scheduled for:  September 8 with Dr. Henriette Combs, PharmD Clinical Pharmacist [place] [phone]

## 2019-05-19 NOTE — Patient Instructions (Signed)
Congratulations! You have met all case management goals! You may call the case management team at any time should you have a question or if you have new case management needs. We are happy to help you! We will let your doctor know that you have met your goals.    Thank you allowing the Chronic Care Management Team to be a part of your care!   Please call a member of the CCM (Chronic Care Management) Team with any questions or case management needs in the future:   Trish Fountain, RN, BSN Nurse Care Coordinator  [phone]  Ruben Reason, PharmD  Clinical Pharmacist  [phone]

## 2019-05-29 ENCOUNTER — Ambulatory Visit: Payer: Self-pay | Admitting: Pharmacist

## 2019-05-29 DIAGNOSIS — E1169 Type 2 diabetes mellitus with other specified complication: Secondary | ICD-10-CM

## 2019-05-29 NOTE — Chronic Care Management (AMB) (Signed)
  Care Management   Note  05/29/2019 Name: MILYN STAPLETON MRN: [id number] DOB: 10/21/1956  Incoming call from Ms. Ronette Deter. HIPAA identifiers verified.   Ms. Silfies was approved for Jardiance 24m [facility] application submitted by CCM pharmacist through 05/18/20. First 90 DS of medication was shipped 05/21/19. Patient calling to notify pharmacist she hadnt' received shipment.   Called [facility]. [place] Provided Ms. Harshman with this information, she should receive her medication today or tomorrow. Provided her with the contact information to call [employer] in the future should she have tracking or shipping issues.   Follow up plan: No further follow up required: Patient has met all CM goals. Provided patient with contact information should needs arise in the future.  JRuben Reason PharmD Clinical Pharmacist [facility] [phone]

## 2019-07-07 ENCOUNTER — Ambulatory Visit: Payer: No Typology Code available for payment source | Admitting: Family Medicine

## 2019-07-17 ENCOUNTER — Encounter: Payer: Self-pay | Admitting: Family Medicine

## 2019-07-17 ENCOUNTER — Other Ambulatory Visit: Payer: Self-pay

## 2019-07-17 ENCOUNTER — Ambulatory Visit (INDEPENDENT_AMBULATORY_CARE_PROVIDER_SITE_OTHER): Payer: No Typology Code available for payment source | Admitting: Family Medicine

## 2019-07-17 VITALS — BP 132/72 | HR 82 | Temp 96.9°F | Resp 16 | Ht 63.0 in | Wt 242.8 lb

## 2019-07-17 DIAGNOSIS — K219 Gastro-esophageal reflux disease without esophagitis: Secondary | ICD-10-CM

## 2019-07-17 DIAGNOSIS — IMO0002 Reserved for concepts with insufficient information to code with codable children: Secondary | ICD-10-CM

## 2019-07-17 DIAGNOSIS — E1169 Type 2 diabetes mellitus with other specified complication: Secondary | ICD-10-CM

## 2019-07-17 DIAGNOSIS — E785 Hyperlipidemia, unspecified: Secondary | ICD-10-CM

## 2019-07-17 DIAGNOSIS — Z23 Encounter for immunization: Secondary | ICD-10-CM

## 2019-07-17 DIAGNOSIS — E669 Obesity, unspecified: Secondary | ICD-10-CM | POA: Diagnosis not present

## 2019-07-17 DIAGNOSIS — I10 Essential (primary) hypertension: Secondary | ICD-10-CM

## 2019-07-17 DIAGNOSIS — I2510 Atherosclerotic heart disease of native coronary artery without angina pectoris: Secondary | ICD-10-CM

## 2019-07-17 DIAGNOSIS — Z9861 Coronary angioplasty status: Secondary | ICD-10-CM

## 2019-07-17 DIAGNOSIS — E559 Vitamin D deficiency, unspecified: Secondary | ICD-10-CM

## 2019-07-17 DIAGNOSIS — I429 Cardiomyopathy, unspecified: Secondary | ICD-10-CM

## 2019-07-17 DIAGNOSIS — M329 Systemic lupus erythematosus, unspecified: Secondary | ICD-10-CM

## 2019-07-17 LAB — POCT GLYCOSYLATED HEMOGLOBIN (HGB A1C): HbA1c, POC (controlled diabetic range): 6.2 % (ref 0.0–7.0)

## 2019-07-17 MED ORDER — ATORVASTATIN CALCIUM 40 MG PO TABS: 40.0000 mg | ORAL_TABLET | Freq: Every day | ORAL | 1 refills | Status: DC

## 2019-07-17 NOTE — Progress Notes (Signed)
Name: Kathy BarriosShelva L Kretschmer   MRN: [id number]    DOB: [birth year]   Date:07/17/2019       Progress Note  Subjective  Chief Complaint  Chief Complaint  Patient presents with  . Medication Refill  . Hypertension  . Diabetes    Checks once a week-Average-112   . Coronary Artery Disease  . Gastroesophageal Reflux  . Hyperlipidemia    HPI  DMII: shewason Xigduo since August 2017, hgbA1C was 6.6%, down 5.9% followed by6.4%,6.4%and it went up toto 7.0%,6.1% and now is 6.2% We changed to [place] and Ozempic 12/2017, she had lost 15 lbs with combo, but lost her job and is now only on Jardiance again, off Ozempic since Fall 2019 because of cost.  She has been checking glucose at home in the mornings has been int he low 100 's. She denies polyphagia, polydipsia or polyuria.Eye exam is up to date She has been compliant with Jardiancegetting it through the drug company assistance program. No recent yeast infections. She will try to cut down on bread and sweets to try to lose some weight.   CAD: s/p MI and stent placement 08/2001, seesDr. Gwen PoundsKowalski yearly, last visit 04/2019 andshehas ischemic cardiomyopathy with left wall hypokinesis. Stress test only showed old lesion, no acute ischemia She has been taking apirin 81 mg daily, off lisinopril and now entresto, spironolactone , carvedilol and atorvastatin. Last EKG at cardiologist showed frequent PVC's. She denies SOB or diaphoresis, orthopnea or edema.Tolerating new regiment   INTERPRETATION MOD LV SYSTOLIC DYSFUNCTION WITH AN ESTIMATED EF = 35-40 % NORMAL RIGHT VENTRICULAR SYSTOLIC FUNCTION MILD TRICUSPID AND MITRAL VALVE INSUFFICIENCY TRACE AORTIC VALVE INSUFFICIENCY NO VALVULAR STENOSIS MILD LV ENLARGEMENT MILD LA ENLARGEMENT  GERD:she had EGD done by Dr. Tobi BastosAnna back 10/2016 and was treated for h. Pylori gastritis. She is nolonger having indigestion or epigastric pain, she states very seldom has a flare and takes prn medication    Morbid Obesity:she has gained 10 lbs since last year, she stopped working August 2019 and has not been as active, also snacking more and watching a lot of TV since the pandemic, discussed routines and projects to stay active   Hyperlipidemia:taking Atorvastatin and is tolerating well.No muscle aches.Reviewed last labs   Patient Active Problem List   Diagnosis Date Noted  . Helicobacter pylori gastritis 11/06/2016  . Cardiomyopathy, ischemic 01/30/2016  . CAD S/P percutaneous coronary angioplasty 09/07/2015  . Diabetes mellitus type 2 in obese (HCC) 09/07/2015  . Dyslipidemia 09/07/2015  . H/O: hysterectomy 09/07/2015  . H/O acute myocardial infarction 09/07/2015  . Vitamin D deficiency 09/07/2015  . Morbid obesity (HCC) 09/07/2015  . Bradycardia 05/13/2014  . Arteriosclerosis of coronary artery 05/13/2014  . Essential (primary) hypertension 05/13/2014  . UARS (upper airway resistance syndrome) 01/23/2013    Past Surgical History:  Procedure Laterality Date  . ABDOMINAL HYSTERECTOMY    . ESOPHAGOGASTRODUODENOSCOPY (EGD) WITH PROPOFOL N/A 11/05/2016   Procedure: ESOPHAGOGASTRODUODENOSCOPY (EGD) WITH PROPOFOL;  Surgeon: Wyline MoodKiran Anna, MD;  Location: [facility] ENDOSCOPY;  Service: Endoscopy;  Laterality: N/A;    Family History  Problem Relation Age of Onset  . Diabetes Mother   . Hypertension Mother   . Cancer Father   . CAD Brother   . Breast cancer Sister 1850    Social History   Socioeconomic History  . Marital status: Married    Spouse name: Dennard Nipugene  . Number of children: 3  . Years of education: Not on file  . Highest education level: GED or equivalent  Occupational History  . Not on file  Social Needs  . Financial resource strain: Not hard at all  . Food insecurity    Worry: Never true    Inability: Never true  . Transportation needs    Medical: No    Non-medical: No  Tobacco Use  . Smoking status: Former Smoker    Packs/day: 1.00    Years: 25.00    Pack  years: 25.00    Types: Cigarettes    Quit date: 09/06/2000    Years since quitting: 18.8  . Smokeless tobacco: Never Used  Substance and Sexual Activity  . Alcohol use: No    Alcohol/week: 0.0 standard drinks  . Drug use: No  . Sexual activity: Yes    Partners: Male  Lifestyle  . Physical activity    Days per week: 2 days    Minutes per session: 10 min  . Stress: Not at all  Relationships  . Social connections    Talks on phone: More than three times a week    Gets together: Three times a week    Attends religious service: More than 4 times per year    Active member of club or organization: Yes    Attends meetings of clubs or organizations: More than 4 times per year    Relationship status: Married  . Intimate partner violence    Fear of current or ex partner: No    Emotionally abused: No    Physically abused: No    Forced sexual activity: No  Other Topics Concern  . Not on file  Social History Narrative  . Not on file     Current Outpatient Medications:  .  aspirin EC 81 MG tablet, Take 1 tablet by mouth daily., Disp: , Rfl:  .  atorvastatin (LIPITOR) 40 MG tablet, Take 1 tablet (40 mg total) by mouth daily., Disp: 90 tablet, Rfl: 1 .  carvedilol (COREG) 3.125 MG tablet, Take 1 tablet by mouth 2 (two) times a day., Disp: , Rfl:  .  Cholecalciferol (VITAMIN D3) 2000 UNITS capsule, Take 1 capsule by mouth daily., Disp: , Rfl:  .  empagliflozin (JARDIANCE) 25 MG TABS tablet, Take 25 mg by mouth daily., Disp: 90 tablet, Rfl: 1 .  Multiple Vitamins-Minerals (WOMENS MULTIVITAMIN PO), Take 1 tablet by mouth. , Disp: , Rfl:  .  sacubitril-valsartan (ENTRESTO) 24-26 MG, Take 1 tablet by mouth daily., Disp: , Rfl:  .  spironolactone (ALDACTONE) 25 MG tablet, Take by mouth., Disp: , Rfl:  .  lisinopril (PRINIVIL,ZESTRIL) 10 MG tablet, Take 1 tablet (10 mg total) by mouth daily. (Patient not taking: Reported on 07/17/2019), Disp: 90 tablet, Rfl: 1  Allergies  Allergen Reactions  .  Metoprolol Other (See Comments)    severe bradycardia  . Pantoprazole Rash  . Sulfa Antibiotics Rash and Hives    I personally reviewed active problem list, medication list, allergies, family history, social history, health maintenance with the patient/caregiver today.   ROS  Constitutional: Negative for fever positive for  weight change.  Respiratory: Negative for cough and shortness of breath.   Cardiovascular: Negative for chest pain or palpitations.  Gastrointestinal: Negative for abdominal pain, no bowel changes.  Musculoskeletal: Negative for gait problem or joint swelling.  Skin: Negative for rash.  Neurological: Negative for dizziness or headache.  No other specific complaints in a complete review of systems (except as listed in HPI above).  Objective  Vitals:   07/17/19 0847  BP: 132/72  Pulse:  82  Resp: 16  Temp: (!) 96.9 F (36.1 C)  TempSrc: Temporal  SpO2: 98%  Weight: 242 lb 12.8 oz (110.1 kg)  Height: 5\' 3"  (1.6 m)    Body mass index is 43.01 kg/m.  Physical Exam  Constitutional: Patient appears well-developed and well-nourished. Obese  No distress.  HEENT: head atraumatic, normocephalic, pupils equal and reactive to light Cardiovascular: Normal rate, extra beats, recent EKG at cardiologist showed frequency PVC's  No murmur heard. No BLE edema. Pulmonary/Chest: Effort normal and breath sounds normal. No respiratory distress. Abdominal: Soft.  There is no tenderness. Psychiatric: Patient has a normal mood and affect. behavior is normal. Judgment and thought content normal.  Recent Results (from the past 2160 hour(s))  POCT HgB A1C     Status: Normal   Collection Time: 07/17/19  8:47 AM  Result Value Ref Range   Hemoglobin A1C     HbA1c POC (<> result, manual entry)     HbA1c, POC (prediabetic range)     HbA1c, POC (controlled diabetic range) 6.2 0.0 - 7.0 %    Diabetic Foot Exam: Diabetic Foot Exam - Simple   Simple Foot Form Diabetic Foot  exam was performed with the following findings: Yes 07/17/2019  9:19 AM  Visual Inspection No deformities, no ulcerations, no other skin breakdown bilaterally: Yes Sensation Testing Intact to touch and monofilament testing bilaterally: Yes Pulse Check Posterior Tibialis and Dorsalis pulse intact bilaterally: Yes Comments      PHQ2/9: Depression screen [facility] 2/[facility] 07/17/2019 03/16/2019 03/04/2019 05/27/2018 01/15/2018  Decreased Interest 3 0 0 0 0  Down, Depressed, Hopeless 0 0 0 0 0  PHQ - 2 Score 3 0 0 0 0  Altered sleeping 0 - 0 0 -  Tired, decreased energy 0 - 0 0 -  Change in appetite 0 - 0 0 -  Feeling bad or failure about yourself  0 - 0 0 -  Trouble concentrating 0 - 0 0 -  Moving slowly or fidgety/restless 0 - 0 0 -  Suicidal thoughts 0 - 0 0 -  PHQ-9 Score 3 - 0 0 -  Difficult doing work/chores Not difficult at all - Not difficult at all Not difficult at all -    phq 9 is positive   Fall Risk: Fall Risk  07/17/2019 03/04/2019 08/28/2018 05/27/2018 01/15/2018  Falls in the past year? 0 0 No No No  Number falls in past yr: 0 0 - - -  Injury with Fall? 0 0 - - -    Functional Status Survey: Is the patient deaf or have difficulty hearing?: No Does the patient have difficulty seeing, even when wearing glasses/contacts?: No Does the patient have difficulty concentrating, remembering, or making decisions?: No Does the patient have difficulty walking or climbing stairs?: No Does the patient have difficulty dressing or bathing?: No Does the patient have difficulty doing errands alone such as visiting a doctor's office or shopping?: No    Assessment & Plan   1. Diabetes mellitus type 2 in obese (HCC)  - POCT HgB A1C - Urine Microalbumin w/creat. ratio  2. Encounter for immunization  - Flu Vaccine MDCK QUAD PF  3. Dyslipidemia associated with type 2 diabetes mellitus (HCC)  - atorvastatin (LIPITOR) 40 MG tablet; Take 1 tablet (40 mg total) by mouth daily.  Dispense: 90  tablet; Refill: 1  4. Essential (primary) hypertension  At goal   5. CAD S/P percutaneous coronary angioplasty  Up to date with follow up with  cardiologist   6. Gastroesophageal reflux disease without esophagitis  Under control   7. Morbid obesity ([place])  Discussed with the patient the risk posed by an increased BMI. Discussed importance of portion control, calorie counting and at least 150 minutes of physical activity weekly. Avoid sweet beverages and drink more water. Eat at least 6 servings of fruit and vegetables daily   8. Cardiomyopathy, unspecified type ([place])   9. History of lupus (HCC)  Last labs were negative, no current joint pains or rashes   10. Vitamin D deficiency  Discussed otc vitamin D

## 2019-07-18 LAB — MICROALBUMIN / CREATININE URINE RATIO
Creatinine, Urine: 70 mg/dL (ref 20–275)
Microalb Creat Ratio: 11 mcg/mg creat (ref <30)
Microalb, Ur: 0.8 mg/dL

## 2019-09-01 ENCOUNTER — Other Ambulatory Visit: Payer: Self-pay | Admitting: Family Medicine

## 2019-09-01 DIAGNOSIS — E1169 Type 2 diabetes mellitus with other specified complication: Secondary | ICD-10-CM

## 2019-11-12 ENCOUNTER — Ambulatory Visit: Payer: Self-pay | Admitting: Pharmacist

## 2019-11-12 DIAGNOSIS — E669 Obesity, unspecified: Secondary | ICD-10-CM

## 2019-11-12 DIAGNOSIS — E1169 Type 2 diabetes mellitus with other specified complication: Secondary | ICD-10-CM

## 2019-11-13 MED ORDER — JARDIANCE 25 MG PO TABS: 25.0000 mg | ORAL_TABLET | Freq: Every day | ORAL | 3 refills | Status: DC

## 2019-11-13 NOTE — Chronic Care Management (AMB) (Signed)
  Care Management   Care Coordination Note  11/13/2019 Name: Kathy Price MRN: [id number] DOB: 1955-12-04  Care Coordination: returned patient's call regarding Jardiance assistance program. Patient application for Jardiance assistance approved until 05/18/20. Patient states she needs a refill per the mail order pharmacy that facilitates program.    The care management team will reach out to the patient again over the next 6 months.   Karalee Height, PharmD Clinical Pharmacist [facility] [phone]

## 2019-11-13 NOTE — Addendum Note (Signed)
Addended by: Alba Cory F on: 11/13/2019 01:06 PM   Modules accepted: Orders

## 2020-01-14 ENCOUNTER — Ambulatory Visit: Payer: No Typology Code available for payment source | Admitting: Family Medicine

## 2020-02-01 ENCOUNTER — Encounter: Payer: Self-pay | Admitting: Family Medicine

## 2020-02-01 ENCOUNTER — Ambulatory Visit (INDEPENDENT_AMBULATORY_CARE_PROVIDER_SITE_OTHER): Payer: Medicare Other | Admitting: Family Medicine

## 2020-02-01 ENCOUNTER — Other Ambulatory Visit: Payer: Self-pay

## 2020-02-01 VITALS — BP 134/72 | HR 99 | Temp 96.8°F | Resp 16 | Ht 63.0 in | Wt 247.7 lb

## 2020-02-01 DIAGNOSIS — I2510 Atherosclerotic heart disease of native coronary artery without angina pectoris: Secondary | ICD-10-CM

## 2020-02-01 DIAGNOSIS — E559 Vitamin D deficiency, unspecified: Secondary | ICD-10-CM

## 2020-02-01 DIAGNOSIS — E669 Obesity, unspecified: Secondary | ICD-10-CM

## 2020-02-01 DIAGNOSIS — Z9861 Coronary angioplasty status: Secondary | ICD-10-CM

## 2020-02-01 DIAGNOSIS — I429 Cardiomyopathy, unspecified: Secondary | ICD-10-CM

## 2020-02-01 DIAGNOSIS — E1169 Type 2 diabetes mellitus with other specified complication: Secondary | ICD-10-CM

## 2020-02-01 DIAGNOSIS — I10 Essential (primary) hypertension: Secondary | ICD-10-CM

## 2020-02-01 DIAGNOSIS — E785 Hyperlipidemia, unspecified: Secondary | ICD-10-CM

## 2020-02-01 LAB — POCT GLYCOSYLATED HEMOGLOBIN (HGB A1C): HbA1c, POC (controlled diabetic range): 6.2 % (ref 0.0–7.0)

## 2020-02-01 MED ORDER — ATORVASTATIN CALCIUM 40 MG PO TABS: 40.0000 mg | ORAL_TABLET | Freq: Every day | ORAL | 1 refills | Status: DC

## 2020-02-01 NOTE — Progress Notes (Signed)
Name: Kathy Price   MRN: [id number]    DOB: 23-Apr-1956   Date:02/01/2020       Progress Note  Subjective  Chief Complaint  Chief Complaint  Patient presents with  . Medication Refill  . Diabetes  . Hypertension  . Coronary Artery Disease  . Gastroesophageal Reflux  . Hyperlipidemia    HPI  DMII: shewason Xigduo since August 2017, hgbA1C was 6.6%, down 5.9% followed by6.4%,6.4%and it went up toto 7.0%,6.1%, 6.2% and again 6.2 % We changed to [place] and [place] 12/2017, she had lost 15 lbs with combo, but lost herjob and is currently only on Jardiance through the assistance program. She has not been checking her glucose lately.. She denies polyphagia, polydipsia or polyuria.Eye exam is up to date  CAD: s/p MI and stent placement 08/2001, seesDr. Nehemiah Massed yearly, last visit 09/2019 andshehas ischemic cardiomyopathy with left wall hypokinesis. Stress test only showed old lesion, no acute ischemia She has been taking apirin 81 mg daily, off lisinopril and is  now entresto, spironolactone , carvedilol and atorvastatin. Last EKG at cardiologist showed frequent PVC's. She denies SOB ,diaphoresis, orthopnea or edema. Stable  INTERPRETATION 40/9811  MOD LV SYSTOLIC DYSFUNCTION WITH AN ESTIMATED EF = 35-40 % NORMAL RIGHT VENTRICULAR SYSTOLIC FUNCTION MILD TRICUSPID AND MITRAL VALVE INSUFFICIENCY TRACE AORTIC VALVE INSUFFICIENCY NO VALVULAR STENOSIS MILD LV ENLARGEMENT MILD LA ENLARGEMENT  GERD:she had EGD done by Dr. Vicente Males back 10/2016 and was treated for h. Pylori gastritis. She is nolonger having indigestion or epigastric pain,she states very seldom has a flare and takes prn otc Tums or Maalox and is doing well   Morbid Obesity:she has gained 20 lbs since 07/2018 she stopped working August 2019 and has not been as active, states also gained more since COVID, but started walking again 2 weeks ago   Hyperlipidemia:taking Atorvastatin and is tolerating  well.No muscle aches.Recheck labs today   Patient Active Problem List   Diagnosis Date Noted  . Helicobacter pylori gastritis 11/06/2016  . Cardiomyopathy, ischemic 01/30/2016  . CAD S/P percutaneous coronary angioplasty 09/07/2015  . Diabetes mellitus type 2 in obese ([place]) 09/07/2015  . Dyslipidemia 09/07/2015  . H/O: hysterectomy 09/07/2015  . H/O acute myocardial infarction 09/07/2015  . Vitamin D deficiency 09/07/2015  . Morbid obesity ([place]) 09/07/2015  . Bradycardia 05/13/2014  . Arteriosclerosis of coronary artery 05/13/2014  . Essential (primary) hypertension 05/13/2014  . UARS (upper airway resistance syndrome) 01/23/2013    Past Surgical History:  Procedure Laterality Date  . ABDOMINAL HYSTERECTOMY    . ESOPHAGOGASTRODUODENOSCOPY (EGD) WITH PROPOFOL N/A 11/05/2016   Procedure: ESOPHAGOGASTRODUODENOSCOPY (EGD) WITH PROPOFOL;  Surgeon: Jonathon Bellows, MD;  Location: [facility] ENDOSCOPY;  Service: Endoscopy;  Laterality: N/A;    Family History  Problem Relation Age of Onset  . Diabetes Mother   . Hypertension Mother   . Cancer Father   . CAD Brother   . Breast cancer Sister 3    Social History   Tobacco Use  . Smoking status: Former Smoker    Packs/day: 1.00    Years: 25.00    Pack years: 25.00    Types: Cigarettes    Quit date: 09/06/2000    Years since quitting: 19.4  . Smokeless tobacco: Never Used  Substance Use Topics  . Alcohol use: No    Alcohol/week: 0.0 standard drinks     Current Outpatient Medications:  .  aspirin EC 81 MG tablet, Take 1 tablet by mouth daily., Disp: , Rfl:  .  atorvastatin (  LIPITOR) 40 MG tablet, TAKE 1 TABLET BY MOUTH EVERY DAY, Disp: 90 tablet, Rfl: 1 .  Cholecalciferol (VITAMIN D3) 2000 UNITS capsule, Take 1 capsule by mouth daily., Disp: , Rfl:  .  empagliflozin (JARDIANCE) 25 MG TABS tablet, Take 25 mg by mouth daily., Disp: 90 tablet, Rfl: 3 .  Multiple Vitamins-Minerals (WOMENS MULTIVITAMIN PO), Take 1 tablet by mouth. , Disp:  , Rfl:  .  sacubitril-valsartan (ENTRESTO) 24-26 MG, Take 1 tablet by mouth daily., Disp: , Rfl:  .  spironolactone (ALDACTONE) 25 MG tablet, Take by mouth., Disp: , Rfl:  .  carvedilol (COREG) 3.125 MG tablet, Take 1 tablet by mouth 2 (two) times a day., Disp: , Rfl:   Allergies  Allergen Reactions  . Metoprolol Other (See Comments)    severe bradycardia  . Pantoprazole Rash  . Sulfa Antibiotics Rash and Hives    I personally reviewed active problem list, medication list, allergies, family history, social history, health maintenance with the patient/caregiver today.   ROS  Constitutional: Negative for fever, positive for  weight change.  Respiratory: Negative for cough and shortness of breath.   Cardiovascular: Negative for chest pain or palpitations.  Gastrointestinal: Negative for abdominal pain, no bowel changes.  Musculoskeletal: Negative for gait problem or joint swelling.  Skin: Negative for rash.  Neurological: Negative for dizziness or headache.  No other specific complaints in a complete review of systems (except as listed in HPI above).  Objective  Vitals:   02/01/20 1124  BP: 134/72  Pulse: 99  Resp: 16  Temp: (!) 96.8 F (36 C)  TempSrc: Temporal  SpO2: 95%  Weight: 247 lb 11.2 oz (112.4 kg)  Height: 5\' 3"  (1.6 m)    Body mass index is 43.88 kg/m.  Physical Exam  Constitutional: Patient appears well-developed and well-nourished. Obese  No distress.  HEENT: head atraumatic, normocephalic, pupils equal and reactive to light Cardiovascular: Normal rate, regular rhythm and normal heart sounds.  No murmur heard. Pitting 1 plus  BLE edema.  Pulmonary/Chest: Effort normal and breath sounds normal. No respiratory distress. Abdominal: Soft.  There is no tenderness. Psychiatric: Patient has a normal mood and affect. behavior is normal. Judgment and thought content normal.  Recent Results (from the past 2160 hour(s))  POCT HgB A1C     Status: Normal    Collection Time: 02/01/20 11:30 AM  Result Value Ref Range   Hemoglobin A1C     HbA1c POC (<> result, manual entry)     HbA1c, POC (prediabetic range)     HbA1c, POC (controlled diabetic range) 6.2 0.0 - 7.0 %     PHQ2/9: Depression screen [facility] 2/9 02/01/2020 07/17/2019 03/16/2019 03/04/2019 05/27/2018  Decreased Interest 0 3 0 0 0  Down, Depressed, Hopeless 0 0 0 0 0  PHQ - 2 Score 0 3 0 0 0  Altered sleeping 0 0 - 0 0  Tired, decreased energy 0 0 - 0 0  Change in appetite 0 0 - 0 0  Feeling bad or failure about yourself  0 0 - 0 0  Trouble concentrating 0 0 - 0 0  Moving slowly or fidgety/restless 0 0 - 0 0  Suicidal thoughts 0 0 - 0 0  PHQ-9 Score 0 3 - 0 0  Difficult doing work/chores Not difficult at all Not difficult at all - Not difficult at all Not difficult at all    phq 9 is negative   Fall Risk: Fall Risk  02/01/2020 07/17/2019 03/04/2019 08/28/2018  05/27/2018  Falls in the past year? 0 0 0 No No  Number falls in past yr: 0 0 0 - -  Injury with Fall? 0 0 0 - -    Functional Status Survey: Is the patient deaf or have difficulty hearing?: No Does the patient have difficulty seeing, even when wearing glasses/contacts?: No Does the patient have difficulty concentrating, remembering, or making decisions?: No Does the patient have difficulty walking or climbing stairs?: No Does the patient have difficulty dressing or bathing?: No Does the patient have difficulty doing errands alone such as visiting a doctor's office or shopping?: No    Assessment & Plan  1. Diabetes mellitus type 2 in obese (HCC)  - POCT HgB A1C  2. Dyslipidemia associated with type 2 diabetes mellitus (HCC)  - atorvastatin (LIPITOR) 40 MG tablet; Take 1 tablet (40 mg total) by mouth daily.  Dispense: 90 tablet; Refill: 1 - Lipid panel  3. Essential (primary) hypertension  - COMPLETE METABOLIC PANEL WITH GFR - CBC with Differential/Platelet  4. Morbid obesity (HCC)  Discussed with the patient the  risk posed by an increased BMI. Discussed importance of portion control, calorie counting and at least 150 minutes of physical activity weekly. Avoid sweet beverages and drink more water. Eat at least 6 servings of fruit and vegetables daily   5. CAD S/P percutaneous coronary angioplasty   6. Vitamin D deficiency   7. Cardiomyopathy, unspecified type (HCC)  Doing well at this time, compliant with medication

## 2020-02-16 DIAGNOSIS — E785 Hyperlipidemia, unspecified: Secondary | ICD-10-CM | POA: Diagnosis not present

## 2020-02-16 DIAGNOSIS — E1169 Type 2 diabetes mellitus with other specified complication: Secondary | ICD-10-CM | POA: Diagnosis not present

## 2020-02-16 DIAGNOSIS — I10 Essential (primary) hypertension: Secondary | ICD-10-CM | POA: Diagnosis not present

## 2020-02-16 LAB — CBC WITH DIFFERENTIAL/PLATELET
Absolute Monocytes: 290 cells/uL (ref 200–950)
Basophils Absolute: 41 cells/uL (ref 0–200)
Basophils Relative: 0.6 %
Eosinophils Absolute: 283 cells/uL (ref 15–500)
Eosinophils Relative: 4.1 %
HCT: 40.2 % (ref 35.0–45.0)
Hemoglobin: 13.2 g/dL (ref 11.7–15.5)
Lymphs Abs: 2864 cells/uL (ref 850–3900)
MCH: 28 pg (ref 27.0–33.0)
MCHC: 32.8 g/dL (ref 32.0–36.0)
MCV: 85.2 fL (ref 80.0–100.0)
MPV: 11 fL (ref 7.5–12.5)
Monocytes Relative: 4.2 %
Neutro Abs: 3422 cells/uL (ref 1500–7800)
Neutrophils Relative %: 49.6 %
Platelets: 266 10*3/uL (ref 140–400)
RBC: 4.72 10*6/uL (ref 3.80–5.10)
RDW: 13.6 % (ref 11.0–15.0)
Total Lymphocyte: 41.5 %
WBC: 6.9 10*3/uL (ref 3.8–10.8)

## 2020-02-16 LAB — COMPLETE METABOLIC PANEL WITH GFR
AG Ratio: 1.4 (calc) (ref 1.0–2.5)
ALT: 14 U/L (ref 6–29)
AST: 14 U/L (ref 10–35)
Albumin: 4 g/dL (ref 3.6–5.1)
Alkaline phosphatase (APISO): 83 U/L (ref 37–153)
BUN: 14 mg/dL (ref 7–25)
CO2: 27 mmol/L (ref 20–32)
Calcium: 10.2 mg/dL (ref 8.6–10.4)
Chloride: 108 mmol/L (ref 98–110)
Creat: 0.93 mg/dL (ref 0.50–0.99)
GFR, Est African American: 75 mL/min/{1.73_m2} (ref >=60)
GFR, Est Non African American: 64 mL/min/{1.73_m2} (ref >=60)
Globulin: 2.9 g/dL (calc) (ref 1.9–3.7)
Glucose, Bld: 122 mg/dL — ABNORMAL HIGH (ref 65–99)
Potassium: 4.6 mmol/L (ref 3.5–5.3)
Sodium: 143 mmol/L (ref 135–146)
Total Bilirubin: 0.4 mg/dL (ref 0.2–1.2)
Total Protein: 6.9 g/dL (ref 6.1–8.1)

## 2020-02-16 LAB — LIPID PANEL
Cholesterol: 124 mg/dL (ref <200)
HDL: 46 mg/dL — ABNORMAL LOW (ref >=50)
LDL Cholesterol (Calc): 59 mg/dL (calc)
Non-HDL Cholesterol (Calc): 78 mg/dL (calc) (ref <130)
Total CHOL/HDL Ratio: 2.7 (calc) (ref <5.0)
Triglycerides: 100 mg/dL (ref <150)

## 2020-03-23 ENCOUNTER — Telehealth: Payer: Self-pay | Admitting: Family Medicine

## 2020-03-23 NOTE — Chronic Care Management (AMB) (Signed)
  Care Management   Note  03/23/2020 Name: [name] MRN: [id number] DOB: 1956/10/03  Kathy Price is a 64 y.o. year old female who is a primary care patient of Alba Cory, MD and is actively engaged with the care management team. I reached out to Kathy Price by phone today to assist with scheduling an initial visit with the Pharmacist  Follow up plan: Telephone appointment with care management team member scheduled for:04/26/2020  Penne Lash, RMA Care Guide, Embedded Care Coordination [facility]  [place], [place] [address] Direct Dial: [phone] [email or web address]

## 2020-04-01 ENCOUNTER — Ambulatory Visit: Payer: Medicare Other | Admitting: Family Medicine

## 2020-04-05 ENCOUNTER — Encounter: Payer: Self-pay | Admitting: Family Medicine

## 2020-04-05 ENCOUNTER — Other Ambulatory Visit: Payer: Self-pay

## 2020-04-05 ENCOUNTER — Ambulatory Visit (INDEPENDENT_AMBULATORY_CARE_PROVIDER_SITE_OTHER): Payer: Medicare Other | Admitting: Family Medicine

## 2020-04-05 VITALS — BP 134/86 | HR 92 | Temp 96.9°F | Resp 16 | Ht 62.0 in | Wt 247.3 lb

## 2020-04-05 DIAGNOSIS — Z1231 Encounter for screening mammogram for malignant neoplasm of breast: Secondary | ICD-10-CM | POA: Diagnosis not present

## 2020-04-05 DIAGNOSIS — Z1211 Encounter for screening for malignant neoplasm of colon: Secondary | ICD-10-CM | POA: Diagnosis not present

## 2020-04-05 DIAGNOSIS — E2839 Other primary ovarian failure: Secondary | ICD-10-CM

## 2020-04-05 DIAGNOSIS — Z0000 Encounter for general adult medical examination without abnormal findings: Secondary | ICD-10-CM

## 2020-04-05 DIAGNOSIS — Z13820 Encounter for screening for osteoporosis: Secondary | ICD-10-CM

## 2020-04-05 DIAGNOSIS — Z23 Encounter for immunization: Secondary | ICD-10-CM | POA: Diagnosis not present

## 2020-04-05 NOTE — Progress Notes (Signed)
Patient: Kathy Price, Female    DOB: 08/24/1956, 64 y.o.   MRN: [id number]  Visit Date: 04/05/2020  Today's Provider: Ruel Favors, MD   Chief Complaint  Patient presents with  . Medicare Wellness    Welcome to Medicare.    Subjective:    HPI   Kathy Price is a 64 y.o. female who presents today for her Subsequent Annual Wellness Visit.  Patient/Caregiver input:  No concernes   Review of Systems  Constitutional: Negative for fever or weight change.  Respiratory: Negative for cough and shortness of breath.   Cardiovascular: Negative for chest pain or palpitations.  Gastrointestinal: Negative for abdominal pain, no bowel changes.  Musculoskeletal: Negative for gait problem or joint swelling.  Skin: Negative for rash.  Neurological: Negative for dizziness or headache.  No other specific complaints in a complete review of systems (except as listed in HPI above).  Past Medical History:  Diagnosis Date  . Back pain   . Breathing-related sleep disorder   . CAD (coronary artery disease)   . Diabetes mellitus without complication (HCC)   . History of MI (myocardial infarction)   . Hyperlipidemia   . Myocardial infarction (HCC)   . Obese   . Postablative ovarian failure   . Vitamin D deficiency     Past Surgical History:  Procedure Laterality Date  . ABDOMINAL HYSTERECTOMY    . ESOPHAGOGASTRODUODENOSCOPY (EGD) WITH PROPOFOL N/A 11/05/2016   Procedure: ESOPHAGOGASTRODUODENOSCOPY (EGD) WITH PROPOFOL;  Surgeon: Wyline Mood, MD;  Location: [facility];  Service: Endoscopy;  Laterality: N/A;    Family History  Problem Relation Age of Onset  . Diabetes Mother   . Hypertension Mother   . Cancer Father   . CAD Brother   . Breast cancer Sister 47    Social History   Socioeconomic History  . Marital status: Married    Spouse name: [name]  . Number of children: 3  . Years of education: Not on file  . Highest education level: GED or equivalent  Occupational  History  . Not on file  Tobacco Use  . Smoking status: Former Smoker    Packs/day: 1.00    Years: 25.00    Pack years: 25.00    Types: Cigarettes    Quit date: 09/06/2000    Years since quitting: 19.5  . Smokeless tobacco: Never Used  Substance and Sexual Activity  . Alcohol use: No    Alcohol/week: 0.0 standard drinks  . Drug use: No  . Sexual activity: Yes    Partners: Male  Other Topics Concern  . Not on file  Social History Narrative  . Not on file   Social Determinants of Health   Financial Resource Strain: Low Risk   . Difficulty of Paying Living Expenses: Not hard at all  Food Insecurity: No Food Insecurity  . Worried About Programme researcher, broadcasting/film/video in the Last Year: Never true  . Ran Out of Food in the Last Year: Never true  Transportation Needs: No Transportation Needs  . Lack of Transportation (Medical): No  . Lack of Transportation (Non-Medical): No  Physical Activity: Sufficiently Active  . Days of Exercise per Week: 7 days  . Minutes of Exercise per Session: 30 min  Stress: No Stress Concern Present  . Feeling of Stress : Not at all  Social Connections: Not Isolated  . Frequency of Communication with Friends and Family: More than three times a week  . Frequency of Social Gatherings with Friends and  Family: More than three times a week  . Attends Religious Services: More than 4 times per year  . Active Member of Clubs or Organizations: Yes  . Attends Banker Meetings: More than 4 times per year  . Marital Status: Married  Catering manager Violence: Not At Risk  . Fear of Current or Ex-Partner: No  . Emotionally Abused: No  . Physically Abused: No  . Sexually Abused: No    Outpatient Encounter Medications as of 04/05/2020  Medication Sig  . aspirin EC 81 MG tablet Take 1 tablet by mouth daily.  Marland Kitchen atorvastatin (LIPITOR) 40 MG tablet Take 1 tablet (40 mg total) by mouth daily.  . Cholecalciferol (VITAMIN D3) 2000 UNITS capsule Take 1 capsule by  mouth daily.  . empagliflozin (JARDIANCE) 25 MG TABS tablet Take 25 mg by mouth daily.  . Multiple Vitamins-Minerals (WOMENS MULTIVITAMIN PO) Take 1 tablet by mouth.   . sacubitril-valsartan (ENTRESTO) 24-26 MG Take 1 tablet by mouth daily.  . carvedilol (COREG) 3.125 MG tablet Take 1 tablet by mouth 2 (two) times a day.  . spironolactone (ALDACTONE) 25 MG tablet Take by mouth.   No facility-administered encounter medications on file as of 04/05/2020.    Allergies  Allergen Reactions  . Metoprolol Other (See Comments)    severe bradycardia  . Pantoprazole Rash  . Sulfa Antibiotics Rash and Hives    Care Team Updated in EHR: Yes  Last Vision Exam:  Wears corrective lenses: Yes - reading glasses  Last Dental Exam: not up to date, needs to go back  Last Hearing Exam: screen today and normal Wears Hearing Aids: No  Functional Ability / Safety Screening 1.  Was the timed Get Up and Go test shorter than 30 seconds?  yes 2.  Does the patient need help with the phone, transportation, shopping,      preparing meals, housework, laundry, medications, or managing money?  yes 3.  Is the patient's home free of loose throw rugs in walkways, pet beds, electrical cords, etc?   yes      Grab bars in the bathroom? no      Handrails on the stairs?   yes      Adequate lighting?   yes 4.  Has the patient noticed any hearing difficulties?   no  Diet Recall and Exercise Regimen: drinking fruit smoothies for breakfast, salad or sandwich for lunch , air fry her food for dinner and sometimes a salad   Advanced Care Planning: A voluntary discussion about advance care planning including the explanation and discussion of advance directives.  Discussed health care proxy and Living will, and the patient was able to identify a health care proxy as oldest daughter .  Patient does not have a living will at present time.  Does patient have a HCPOA?    no If yes, name and contact information: N/A Does patient have a  living will or MOST form?  no  Cancer Screenings: Skin: no lesions  Lung:  Low Dose CT Chest recommended if Age 19-80 years, 30 pack-year currently smoking OR have quit w/in 15years. Patient does not qualify. Breast:  Up to date on Mammogram? No  Up to date of Bone Density/Dexa? No Colon: we will place referral colonoscopy since report of the last one is not available for review  Additional Screenings:  Hepatitis B/HIV/Syphillis: not interested  Hepatitis C Screening: up to date  Intimate Partner Violence: negative screen   Objective:   Vitals: BP 134/86  Pulse 92   Temp (!) 96.9 F (36.1 C) (Temporal)   Resp 16   Ht 5\' 2"  (1.575 m)   Wt 247 lb 4.8 oz (112.2 kg)   SpO2 97%   BMI 45.23 kg/m  Body mass index is 45.23 kg/m.   Hearing Screening   125Hz  250Hz  500Hz  1000Hz  2000Hz  3000Hz  4000Hz  6000Hz  8000Hz   Right ear:   Pass Pass Pass  Pass    Left ear:   Pass Pass Pass  Pass      Visual Acuity Screening   Right eye Left eye Both eyes  Without correction: 20 25 20 25 20 25   With correction:       Physical Exam Constitutional: Patient appears well-developed and well-nourished. Obese  No distress.  HEENT: head atraumatic, normocephalic, pupils equal and reactive to light,  neck supple Cardiovascular: Normal rate, regular rhythm and normal heart sounds.  No murmur heard. No BLE edema. Pulmonary/Chest: Effort normal and breath sounds normal. No respiratory distress. Abdominal: Soft.  There is no tenderness. Psychiatric: Patient has a normal mood and affect. behavior is normal. Judgment and thought content normal.  Cognitive Testing - 6-CIT  Correct? Score   What year is it? yes 0 Yes = 0    No = 4  What month is it? yes 0 Yes = 0    No = 3  Remember:     Floyde ParkinsJohn Smith, [address] [place] [place], [place]     What time is it? yes 0 Yes = 0    No = 3  Count backwards from 20 to 1 yes 0 Correct = 0    1 error = 2   More than 1 error = 4  Say the months of the year in reverse. yes 0  Correct = 0    1 error = 2   More than 1 error = 4  What address did I ask you to remember? yes 0 Correct = 0  1 error = 2    2 error = 4    3 error = 6    4 error = 8    All wrong = 10       TOTAL SCORE  0/28   Interpretation:  Normal  Normal (0-7) Abnormal (8-28)   Fall Risk: Fall Risk  04/05/2020 02/01/2020 07/17/2019 03/04/2019 08/28/2018  Falls in the past year? 0 0 0 0 No  Number falls in past yr: 0 0 0 0 -  Injury with Fall? 0 0 0 0 -    Depression Screen [facility] screen [facility] 2/9 04/05/2020 02/01/2020 07/17/2019 03/16/2019 03/04/2019  Decreased Interest 0 0 3 0 0  Down, Depressed, Hopeless 0 0 0 0 0  PHQ - 2 Score 0 0 3 0 0  Altered sleeping 0 0 0 - 0  Tired, decreased energy 0 0 0 - 0  Change in appetite 0 0 0 - 0  Feeling bad or failure about yourself  0 0 0 - 0  Trouble concentrating 0 0 0 - 0  Moving slowly or fidgety/restless 0 0 0 - 0  Suicidal thoughts 0 0 0 - 0  PHQ-9 Score 0 0 3 - 0  Difficult doing work/chores - Not difficult at all Not difficult at all - Not difficult at all    Recent Results (from the past 2160 hour(s))  POCT HgB A1C     Status: Normal   Collection Time: 02/01/20 11:30 AM  Result Value Ref Range   Hemoglobin  A1C     HbA1c POC (<> result, manual entry)     HbA1c, POC (prediabetic range)     HbA1c, POC (controlled diabetic range) 6.2 0.0 - 7.0 %  Lipid panel     Status: Abnormal   Collection Time: 02/16/20  8:30 AM  Result Value Ref Range   Cholesterol 124 <200 mg/dL   HDL 46 (L) > OR = 50 mg/dL   Triglycerides 546 <568 mg/dL   LDL Cholesterol (Calc) 59 mg/dL (calc)    Comment: Reference range: <100 . Desirable range <100 mg/dL for primary prevention;   <70 mg/dL for patients with CHD or diabetic patients  with > or = 2 CHD risk factors. Marland Kitchen LDL-C is now calculated using the Martin-Hopkins  calculation, which is a validated novel method providing  better accuracy than the Friedewald equation in the  estimation of LDL-C.  Horald Pollen et al. Lenox Ahr.  1275;170(01): 2061-2068  (http://education.[facility].com/faq/FAQ164)    Total CHOL/HDL Ratio 2.7 <5.0 (calc)   Non-HDL Cholesterol (Calc) 78 <749 mg/dL (calc)    Comment: For patients with diabetes plus 1 major ASCVD risk  factor, treating to a non-HDL-C goal of <100 mg/dL  (LDL-C of <44 mg/dL) is considered a therapeutic  option.   COMPLETE METABOLIC PANEL WITH GFR     Status: Abnormal   Collection Time: 02/16/20  8:30 AM  Result Value Ref Range   Glucose, Bld 122 (H) 65 - 99 mg/dL    Comment: .            Fasting reference interval . For someone without known diabetes, a glucose value between 100 and 125 mg/dL is consistent with prediabetes and should be confirmed with a follow-up test. .    BUN 14 7 - 25 mg/dL   Creat 9.67 5.91 - 6.38 mg/dL    Comment: For patients >92 years of age, the reference limit for Creatinine is approximately 13% higher for people identified as African-American. .    GFR, Est Non African American 64 > OR = 60 mL/min/1.25m2   GFR, Est African American 75 > OR = 60 mL/min/1.6m2   BUN/Creatinine Ratio NOT APPLICABLE 6 - 22 (calc)   Sodium 143 135 - 146 mmol/L   Potassium 4.6 3.5 - 5.3 mmol/L   Chloride 108 98 - 110 mmol/L   CO2 27 20 - 32 mmol/L   Calcium 10.2 8.6 - 10.4 mg/dL   Total Protein 6.9 6.1 - 8.1 g/dL   Albumin 4.0 3.6 - 5.1 g/dL   Globulin 2.9 1.9 - 3.7 g/dL (calc)   AG Ratio 1.4 1.0 - 2.5 (calc)   Total Bilirubin 0.4 0.2 - 1.2 mg/dL   Alkaline phosphatase (APISO) 83 37 - 153 U/L   AST 14 10 - 35 U/L   ALT 14 6 - 29 U/L  CBC with Differential/Platelet     Status: None   Collection Time: 02/16/20  8:30 AM  Result Value Ref Range   WBC 6.9 3.8 - 10.8 Thousand/uL   RBC 4.72 3.80 - 5.10 Million/uL   Hemoglobin 13.2 11.7 - 15.5 g/dL   HCT 46.6 59.9 - 35.7 %   MCV 85.2 80.0 - 100.0 fL   MCH 28.0 27.0 - 33.0 pg   MCHC 32.8 32.0 - 36.0 g/dL   RDW 01.7 79.3 - 90.3 %   Platelets 266 140 - 400 Thousand/uL   MPV 11.0 7.5 - 12.5  fL   Neutro Abs 3,422 1,500 - 7,800 cells/uL   Lymphs Abs  2,864 850 - 3,900 cells/uL   Absolute Monocytes 290 200 - 950 cells/uL   Eosinophils Absolute 283 15 - 500 cells/uL   Basophils Absolute 41 0 - 200 cells/uL   Neutrophils Relative % 49.6 %   Total Lymphocyte 41.5 %   Monocytes Relative 4.2 %   Eosinophils Relative 4.1 %   Basophils Relative 0.6 %    Assessment & Plan:    There are no diagnoses linked to this encounter.   1. Welcome to Medicare preventive visit  - MM 3D SCREEN BREAST BILATERAL; Future - DG Bone Density; Future - Ambulatory referral to Gastroenterology  2. Encounter for screening mammogram for breast cancer  - MM 3D SCREEN BREAST BILATERAL; Future  3. Screening for colon cancer  - Ambulatory referral to Gastroenterology  4. Ovarian failure   5. Osteoporosis screening  - DG Bone Density; Future  6. Need for vaccination with 13-polyvalent pneumococcal conjugate vaccine  - Pneumococcal conjugate vaccine 13-valent IM  Exercise Activities and Dietary recommendations  She will walk twice a day for at least 30 minutes She has increased calcium intake, drinking smoothies   - Discussed health benefits of physical activity, and encouraged her to engage in regular exercise appropriate for her age and condition.   Immunization History  Administered Date(s) Administered  . Influenza Inj Mdck Quad Pf 07/17/2019  . Influenza, Seasonal, Injecte, Preservative Fre 07/29/2012, 06/29/2014  . Influenza,inj,Quad PF,6+ Mos 09/29/2013, 06/28/2017, 08/28/2018  . Influenza-Unspecified 06/29/2014, 08/25/2015, 08/16/2016  . Moderna SARS-COVID-2 Vaccination 12/26/2019, 01/23/2020  . Pneumococcal Conjugate-13 04/07/2010  . Pneumococcal Polysaccharide-23 04/07/2010, 12/30/2014, 09/07/2015  . Td 04/07/2010  . Tdap 04/07/2010  . Zoster 09/25/2016    Health Maintenance  Topic Date Due  . OPHTHALMOLOGY EXAM  04/04/2019  . DEXA SCAN  01/25/2020  . TETANUS/TDAP   04/07/2020  . INFLUENZA VACCINE  05/29/2020  . FOOT EXAM  07/16/2020  . URINE MICROALBUMIN  07/16/2020  . HEMOGLOBIN A1C  08/02/2020  . PNA vac Low Risk Adult (2 of 2 - PPSV23) 09/06/2020  . MAMMOGRAM  04/15/2021  . COLONOSCOPY  12/23/2023  . COVID-19 Vaccine  Completed  . Hepatitis C Screening  Completed  . HIV Screening  Completed    No orders of the defined types were placed in this encounter.   Current Outpatient Medications:  .  aspirin EC 81 MG tablet, Take 1 tablet by mouth daily., Disp: , Rfl:  .  atorvastatin (LIPITOR) 40 MG tablet, Take 1 tablet (40 mg total) by mouth daily., Disp: 90 tablet, Rfl: 1 .  Cholecalciferol (VITAMIN D3) 2000 UNITS capsule, Take 1 capsule by mouth daily., Disp: , Rfl:  .  empagliflozin (JARDIANCE) 25 MG TABS tablet, Take 25 mg by mouth daily., Disp: 90 tablet, Rfl: 3 .  Multiple Vitamins-Minerals (WOMENS MULTIVITAMIN PO), Take 1 tablet by mouth. , Disp: , Rfl:  .  sacubitril-valsartan (ENTRESTO) 24-26 MG, Take 1 tablet by mouth daily., Disp: , Rfl:  .  carvedilol (COREG) 3.125 MG tablet, Take 1 tablet by mouth 2 (two) times a day., Disp: , Rfl:  .  spironolactone (ALDACTONE) 25 MG tablet, Take by mouth., Disp: , Rfl:  There are no discontinued medications.  I have personally reviewed and addressed the Medicare Annual Wellness health risk assessment questionnaire and have noted the following in the patient's chart:  A.         Medical and social history & family history B.         Use of alcohol, tobacco,  and illicit drugs  C.         Current medications and supplements D.         Functional and Cognitive ability and status E.         Nutritional status F.         Physical activity G.        Advance directives H.         List of other physicians I.          Hospitalizations, surgeries, and ER visits in previous 12 months J.         Devola such as hearing, vision, cognitive function, and depression L.         Referrals  and appointments:  Colonoscopy, bone density and mammogram   In addition, I have reviewed and discussed with patient certain preventive protocols, quality metrics, and best practice recommendations. A written personalized care plan for preventive services as well as general preventive health recommendations were provided to patient.   See attached scanned questionnaire for additional information.

## 2020-04-05 NOTE — Patient Instructions (Signed)
Preventive Care 38 Years and Older, Female Preventive care refers to lifestyle choices and visits with your health care provider that can promote health and wellness. This includes:  A yearly physical exam. This is also called an annual well check.  Regular dental and eye exams.  Immunizations.  Screening for certain conditions.  Healthy lifestyle choices, such as diet and exercise. What can I expect for my preventive care visit? Physical exam Your health care provider will check:  Height and weight. These may be used to calculate body mass index (BMI), which is a measurement that tells if you are at a healthy weight.  Heart rate and blood pressure.  Your skin for abnormal spots. Counseling Your health care provider may ask you questions about:  Alcohol, tobacco, and drug use.  Emotional well-being.  Home and relationship well-being.  Sexual activity.  Eating habits.  History of falls.  Memory and ability to understand (cognition).  Work and work Statistician.  Pregnancy and menstrual history. What immunizations do I need?  Influenza (flu) vaccine  This is recommended every year. Tetanus, diphtheria, and pertussis (Tdap) vaccine  You may need a Td booster every 10 years. Varicella (chickenpox) vaccine  You may need this vaccine if you have not already been vaccinated. Zoster (shingles) vaccine  You may need this after age 33. Pneumococcal conjugate (PCV13) vaccine  One dose is recommended after age 33. Pneumococcal polysaccharide (PPSV23) vaccine  One dose is recommended after age 72. Measles, mumps, and rubella (MMR) vaccine  You may need at least one dose of MMR if you were born in 1957 or later. You may also need a second dose. Meningococcal conjugate (MenACWY) vaccine  You may need this if you have certain conditions. Hepatitis A vaccine  You may need this if you have certain conditions or if you travel or work in places where you may be exposed  to hepatitis A. Hepatitis B vaccine  You may need this if you have certain conditions or if you travel or work in places where you may be exposed to hepatitis B. Haemophilus influenzae type b (Hib) vaccine  You may need this if you have certain conditions. You may receive vaccines as individual doses or as more than one vaccine together in one shot (combination vaccines). Talk with your health care provider about the risks and benefits of combination vaccines. What tests do I need? Blood tests  Lipid and cholesterol levels. These may be checked every 5 years, or more frequently depending on your overall health.  Hepatitis C test.  Hepatitis B test. Screening  Lung cancer screening. You may have this screening every year starting at age 39 if you have a 30-pack-year history of smoking and currently smoke or have quit within the past 15 years.  Colorectal cancer screening. All adults should have this screening starting at age 36 and continuing until age 15. Your health care provider may recommend screening at age 23 if you are at increased risk. You will have tests every 1-10 years, depending on your results and the type of screening test.  Diabetes screening. This is done by checking your blood sugar (glucose) after you have not eaten for a while (fasting). You may have this done every 1-3 years.  Mammogram. This may be done every 1-2 years. Talk with your health care provider about how often you should have regular mammograms.  BRCA-related cancer screening. This may be done if you have a family history of breast, ovarian, tubal, or peritoneal cancers.  Other tests  Sexually transmitted disease (STD) testing.  Bone density scan. This is done to screen for osteoporosis. You may have this done starting at age 44. Follow these instructions at home: Eating and drinking  Eat a diet that includes fresh fruits and vegetables, whole grains, lean protein, and low-fat dairy products. Limit  your intake of foods with high amounts of sugar, saturated fats, and salt.  Take vitamin and mineral supplements as recommended by your health care provider.  Do not drink alcohol if your health care provider tells you not to drink.  If you drink alcohol: ? Limit how much you have to 0-1 drink a day. ? Be aware of how much alcohol is in your drink. In the [place] one drink equals one 12 oz bottle of beer (355 mL), one 5 oz glass of wine (148 mL), or one 1 oz glass of hard liquor (44 mL). Lifestyle  Take daily care of your teeth and gums.  Stay active. Exercise for at least 30 minutes on 5 or more days each week.  Do not use any products that contain nicotine or tobacco, such as cigarettes, e-cigarettes, and chewing tobacco. If you need help quitting, ask your health care provider.  If you are sexually active, practice safe sex. Use a condom or other form of protection in order to prevent STIs (sexually transmitted infections).  Talk with your health care provider about taking a low-dose aspirin or statin. What's next?  Go to your health care provider once a year for a well check visit.  Ask your health care provider how often you should have your eyes and teeth checked.  Stay up to date on all vaccines. This information is not intended to replace advice given to you by your health care provider. Make sure you discuss any questions you have with your health care provider. Document Revised: 10/09/2018 Document Reviewed: 10/09/2018 Elsevier Patient Education  2020 [employer].

## 2020-04-11 ENCOUNTER — Telehealth (INDEPENDENT_AMBULATORY_CARE_PROVIDER_SITE_OTHER): Payer: Self-pay | Admitting: Gastroenterology

## 2020-04-11 ENCOUNTER — Other Ambulatory Visit: Payer: Self-pay

## 2020-04-11 DIAGNOSIS — Z1211 Encounter for screening for malignant neoplasm of colon: Secondary | ICD-10-CM

## 2020-04-11 DIAGNOSIS — Z800 Family history of malignant neoplasm of digestive organs: Secondary | ICD-10-CM

## 2020-04-11 NOTE — Progress Notes (Signed)
Gastroenterology Pre-Procedure Review  Request Date: Monday 05/09/20 Requesting Physician: Dr. Tobi Bastos  PATIENT REVIEW QUESTIONS: The patient responded to the following health history questions as indicated:    1. Are you having any GI issues? no 2. Do you have a personal history of Polyps? no 3. Do you have a family history of Colon Cancer or Polyps? yes (father colon cancer) 4. Diabetes Mellitus? no 5. Joint replacements in the past 12 months?no 6. Major health problems in the past 3 months?no 7. Any artificial heart valves, MVP, or defibrillator?Patient does have a stent.  Cardiac clearance sent to Dr. Gwen Pounds.    MEDICATIONS & ALLERGIES:    Patient reports the following regarding taking any anticoagulation/antiplatelet therapy:   Plavix, Coumadin, Eliquis, Xarelto, Lovenox, Pradaxa, Brilinta, or Effient? no Aspirin? yes (81 mg daily)  Patient confirms/reports the following medications:  Current Outpatient Medications  Medication Sig Dispense Refill  . aspirin EC 81 MG tablet Take 1 tablet by mouth daily.    Marland Kitchen atorvastatin (LIPITOR) 40 MG tablet Take 1 tablet (40 mg total) by mouth daily. 90 tablet 1  . Cholecalciferol (VITAMIN D3) 2000 UNITS capsule Take 1 capsule by mouth daily.    . empagliflozin (JARDIANCE) 25 MG TABS tablet Take 25 mg by mouth daily. 90 tablet 3  . Multiple Vitamins-Minerals (WOMENS MULTIVITAMIN PO) Take 1 tablet by mouth.     . sacubitril-valsartan (ENTRESTO) 24-26 MG Take 1 tablet by mouth daily.    . carvedilol (COREG) 3.125 MG tablet Take 1 tablet by mouth 2 (two) times a day.    . spironolactone (ALDACTONE) 25 MG tablet Take by mouth.     No current facility-administered medications for this visit.    Patient confirms/reports the following allergies:  Allergies  Allergen Reactions  . Metoprolol Other (See Comments)    severe bradycardia  . Pantoprazole Rash  . Sulfa Antibiotics Rash and Hives    No orders of the defined types were placed in this  encounter.   AUTHORIZATION INFORMATION Primary Insurance: 1D#: Group #:  Secondary Insurance: 1D#: Group #:  SCHEDULE INFORMATION: Date: Monday 05/09/20 Time: Location:[facility]

## 2020-04-12 ENCOUNTER — Ambulatory Visit
Admission: RE | Admit: 2020-04-12 | Discharge: 2020-04-12 | Disposition: A | Discharge status: Home / Self Care | Payer: Medicare Other | Source: Ambulatory Visit | Attending: Family Medicine | Admitting: Family Medicine

## 2020-04-12 DIAGNOSIS — Z780 Asymptomatic menopausal state: Secondary | ICD-10-CM | POA: Diagnosis not present

## 2020-04-12 DIAGNOSIS — Z13820 Encounter for screening for osteoporosis: Secondary | ICD-10-CM | POA: Diagnosis not present

## 2020-04-12 DIAGNOSIS — Z1231 Encounter for screening mammogram for malignant neoplasm of breast: Secondary | ICD-10-CM | POA: Diagnosis not present

## 2020-04-12 DIAGNOSIS — Z0000 Encounter for general adult medical examination without abnormal findings: Secondary | ICD-10-CM | POA: Insufficient documentation

## 2020-04-26 ENCOUNTER — Telehealth: Payer: Medicare Other

## 2020-04-26 ENCOUNTER — Encounter: Payer: Self-pay | Admitting: Family Medicine

## 2020-05-03 ENCOUNTER — Telehealth: Payer: Self-pay | Admitting: Family Medicine

## 2020-05-03 NOTE — Chronic Care Management (AMB) (Signed)
  Care Management   Note  05/03/2020 Name: PAMMY VESEY MRN: [id number] DOB: [birth year]  Ermalinda Barrios is a 64 y.o. year old female who is a primary care patient of Alba Cory, MD and is actively engaged with the care management team. I reached out to Ermalinda Barrios by phone today to assist with re-scheduling an initial visit with the Pharmacist  Follow up plan: Telephone appointment with care management team member scheduled for:05/24/2020  Penne Lash, RMA Care Guide, Embedded Care Coordination [facility]  [place], [place] [address] Direct Dial: [phone] [email or web address]: [email or web address]

## 2020-05-05 ENCOUNTER — Other Ambulatory Visit
Admission: RE | Admit: 2020-05-05 | Discharge: 2020-05-05 | Disposition: A | Discharge status: Home / Self Care | Payer: Medicare Other | Source: Ambulatory Visit | Attending: Gastroenterology | Admitting: Gastroenterology

## 2020-05-05 ENCOUNTER — Other Ambulatory Visit: Payer: Self-pay

## 2020-05-05 DIAGNOSIS — Z20822 Contact with and (suspected) exposure to covid-19: Secondary | ICD-10-CM | POA: Insufficient documentation

## 2020-05-05 DIAGNOSIS — Z01812 Encounter for preprocedural laboratory examination: Secondary | ICD-10-CM | POA: Diagnosis present

## 2020-05-05 LAB — SARS CORONAVIRUS 2 (TAT 6-24 HRS): SARS Coronavirus 2: NEGATIVE

## 2020-05-09 ENCOUNTER — Other Ambulatory Visit: Payer: Self-pay

## 2020-05-09 ENCOUNTER — Ambulatory Visit
Admission: RE | Admit: 2020-05-09 | Discharge: 2020-05-09 | Disposition: A | Discharge status: Home / Self Care | Payer: Medicare Other | Attending: Gastroenterology | Admitting: Gastroenterology

## 2020-05-09 ENCOUNTER — Encounter: Payer: Self-pay | Admitting: Gastroenterology

## 2020-05-09 ENCOUNTER — Ambulatory Visit: Payer: Medicare Other | Admitting: Registered Nurse

## 2020-05-09 ENCOUNTER — Encounter
Admission: RE | Disposition: A | Discharge status: Home / Self Care | Payer: Self-pay | Source: Home / Self Care | Attending: Gastroenterology

## 2020-05-09 DIAGNOSIS — Z7982 Long term (current) use of aspirin: Secondary | ICD-10-CM | POA: Insufficient documentation

## 2020-05-09 DIAGNOSIS — I10 Essential (primary) hypertension: Secondary | ICD-10-CM | POA: Diagnosis not present

## 2020-05-09 DIAGNOSIS — K5730 Diverticulosis of large intestine without perforation or abscess without bleeding: Secondary | ICD-10-CM | POA: Diagnosis not present

## 2020-05-09 DIAGNOSIS — Z6841 Body Mass Index (BMI) 40.0 and over, adult: Secondary | ICD-10-CM | POA: Diagnosis not present

## 2020-05-09 DIAGNOSIS — Z79899 Other long term (current) drug therapy: Secondary | ICD-10-CM | POA: Insufficient documentation

## 2020-05-09 DIAGNOSIS — E119 Type 2 diabetes mellitus without complications: Secondary | ICD-10-CM | POA: Diagnosis not present

## 2020-05-09 DIAGNOSIS — E785 Hyperlipidemia, unspecified: Secondary | ICD-10-CM | POA: Insufficient documentation

## 2020-05-09 DIAGNOSIS — I2510 Atherosclerotic heart disease of native coronary artery without angina pectoris: Secondary | ICD-10-CM | POA: Insufficient documentation

## 2020-05-09 DIAGNOSIS — Z87891 Personal history of nicotine dependence: Secondary | ICD-10-CM | POA: Diagnosis not present

## 2020-05-09 DIAGNOSIS — Z1211 Encounter for screening for malignant neoplasm of colon: Secondary | ICD-10-CM | POA: Diagnosis not present

## 2020-05-09 DIAGNOSIS — I252 Old myocardial infarction: Secondary | ICD-10-CM | POA: Insufficient documentation

## 2020-05-09 DIAGNOSIS — K5790 Diverticulosis of intestine, part unspecified, without perforation or abscess without bleeding: Secondary | ICD-10-CM | POA: Diagnosis not present

## 2020-05-09 DIAGNOSIS — Z800 Family history of malignant neoplasm of digestive organs: Secondary | ICD-10-CM | POA: Diagnosis not present

## 2020-05-09 HISTORY — PX: COLONOSCOPY WITH PROPOFOL: SHX5780

## 2020-05-09 LAB — GLUCOSE, CAPILLARY: Glucose-Capillary: 121 mg/dL — ABNORMAL HIGH (ref 70–99)

## 2020-05-09 SURGERY — COLONOSCOPY WITH PROPOFOL
Anesthesia: General

## 2020-05-09 MED ORDER — PROPOFOL 10 MG/ML IV BOLUS
INTRAVENOUS | Status: DC | PRN
  Administered 2020-05-09: 100 mg via INTRAVENOUS

## 2020-05-09 MED ORDER — LIDOCAINE HCL (CARDIAC) PF 100 MG/5ML IV SOSY
PREFILLED_SYRINGE | INTRAVENOUS | Status: DC | PRN
  Administered 2020-05-09: 100 mg via INTRAVENOUS

## 2020-05-09 MED ORDER — DEXMEDETOMIDINE HCL 200 MCG/2ML IV SOLN
INTRAVENOUS | Status: DC | PRN
  Administered 2020-05-09: 20 ug via INTRAVENOUS

## 2020-05-09 MED ORDER — PROPOFOL 500 MG/50ML IV EMUL
INTRAVENOUS | Status: DC | PRN
  Administered 2020-05-09: 150 ug/kg/min via INTRAVENOUS

## 2020-05-09 MED ORDER — SODIUM CHLORIDE 0.9 % IV SOLN: INTRAVENOUS | Status: DC

## 2020-05-09 NOTE — Anesthesia Preprocedure Evaluation (Signed)
Anesthesia Evaluation  Patient identified by MRN, date of birth, ID band Patient awake    Reviewed: Allergy & Precautions, NPO status , Patient's Chart, lab work & pertinent test results  History of Anesthesia Complications Negative for: history of anesthetic complications  Airway Mallampati: III  TM Distance: >3 FB Neck ROM: Full    Dental  (+) Upper Dentures, Dental Advidsory Given   Pulmonary neg sleep apnea, neg COPD, former smoker,    breath sounds clear to auscultation- rhonchi (-) wheezing      Cardiovascular hypertension, Pt. on medications (-) angina+ CAD, + Past MI (2002) and + Cardiac Stents (2002)  (-) dysrhythmias (-) Valvular Problems/Murmurs Rhythm:Regular Rate:Normal - Systolic murmurs and - Diastolic murmurs Echo 01/30/16: MILD LV SYSTOLIC DYSFUNCTION WITH MILD LVH MILD VALVULAR REGURGITATION (mild MR, mild TR) NO VALVULAR STENOSIS EF 40-45%  NM stress test 01/30/16: fixed inferior defect consistent with scar from prior MI   Neuro/Psych negative neurological ROS  negative psych ROS   GI/Hepatic negative GI ROS, Neg liver ROS,   Endo/Other  diabetes, Type 2, Oral Hypoglycemic AgentsMorbid obesity  Renal/GU negative Renal ROS     Musculoskeletal negative musculoskeletal ROS (+)   Abdominal (+) + obese,   Peds  Hematology negative hematology ROS (+)   Anesthesia Other Findings Past Medical History: No date: Back pain No date: Breathing-related sleep disorder No date: CAD (coronary artery disease) No date: Diabetes mellitus without complication (HCC) No date: History of MI (myocardial infarction) No date: Hyperlipidemia No date: Myocardial infarction No date: Obese 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 No date: Vitamin D deficiency   Reproductive/Obstetrics                             Anesthesia Physical  Anesthesia Plan  ASA: III  Anesthesia Plan: General    Post-op Pain Management:    Induction: Intravenous  PONV Risk Score and Plan: Propofol infusion and TIVA  Airway Management Planned: Natural Airway and Nasal Cannula  Additional Equipment:   Intra-op Plan:   Post-operative Plan:   Informed Consent: I have reviewed the patients History and Physical, chart, labs and discussed the procedure including the risks, benefits and alternatives for the proposed anesthesia with the patient or authorized representative who has indicated his/her understanding and acceptance.     Dental advisory given  Plan Discussed with: CRNA and Anesthesiologist  Anesthesia Plan Comments:         Anesthesia Quick Evaluation

## 2020-05-09 NOTE — H&P (Signed)
Wyline Mood, MD [address], [place], [place], [address] [address], [address], [place], [place], [address] Phone: [phone]  Fax: [phone]  Primary Care Physician:  Alba Cory, MD   Pre-Procedure History & Physical: HPI:  Kathy Price is a 64 y.o. female is here for an colonoscopy.   Past Medical History:  Diagnosis Date  . Back pain   . Breathing-related sleep disorder   . CAD (coronary artery disease)   . Diabetes mellitus without complication (HCC)   . History of MI (myocardial infarction)   . Hyperlipidemia   . Myocardial infarction (HCC)   . Obese   . Postablative ovarian failure   . Vitamin D deficiency     Past Surgical History:  Procedure Laterality Date  . ABDOMINAL HYSTERECTOMY    . COLONOSCOPY    . CORONARY ANGIOPLASTY  2002   stents  . ESOPHAGOGASTRODUODENOSCOPY (EGD) WITH PROPOFOL N/A 11/05/2016   Procedure: ESOPHAGOGASTRODUODENOSCOPY (EGD) WITH PROPOFOL;  Surgeon: Wyline Mood, MD;  Location: [facility] ENDOSCOPY;  Service: Endoscopy;  Laterality: N/A;    Prior to Admission medications   Medication Sig Start Date End Date Taking? Authorizing Provider  atorvastatin (LIPITOR) 40 MG tablet Take 1 tablet (40 mg total) by mouth daily. 02/01/20  Yes Sowles, Danna Hefty, MD  Cholecalciferol (VITAMIN D3) 2000 UNITS capsule Take 1 capsule by mouth daily. 12/27/09  Yes [provider]  empagliflozin (JARDIANCE) 25 MG TABS tablet Take 25 mg by mouth daily. 11/13/19  Yes Sowles, Danna Hefty, MD  sacubitril-valsartan (ENTRESTO) 24-26 MG Take 1 tablet by mouth daily. 02/25/19  Yes Deirdre Evener, MD  spironolactone (ALDACTONE) 25 MG tablet Take by mouth. 03/30/19 05/09/20 Yes [provider]  aspirin EC 81 MG tablet Take 1 tablet by mouth daily.    [provider]  carvedilol (COREG) 3.125 MG tablet Take 1 tablet by mouth 2 (two) times a day. 12/23/18 12/23/19  Deirdre Evener, MD  Multiple Vitamins-Minerals (WOMENS MULTIVITAMIN PO)  Take 1 tablet by mouth.     [provider]    Allergies as of 04/11/2020 - Review Complete 04/11/2020  Allergen Reaction Noted  . Metoprolol Other (See Comments) 09/07/2015  . Pantoprazole Rash 09/25/2016  . Sulfa antibiotics Rash and Hives 09/07/2015    Family History  Problem Relation Age of Onset  . Diabetes Mother   . Hypertension Mother   . Cancer Father   . CAD Brother   . Breast cancer Sister 32    Social History   Socioeconomic History  . Marital status: Married    Spouse name: [name]  . Number of children: 3  . Years of education: Not on file  . Highest education level: GED or equivalent  Occupational History  . Not on file  Tobacco Use  . Smoking status: Former Smoker    Packs/day: 1.00    Years: 25.00    Pack years: 25.00    Types: Cigarettes    Quit date: 09/06/2000    Years since quitting: 19.6  . Smokeless tobacco: Never Used  Vaping Use  . Vaping Use: Never used  Substance and Sexual Activity  . Alcohol use: No    Alcohol/week: 0.0 standard drinks  . Drug use: No  . Sexual activity: Yes    Partners: Male  Other Topics Concern  . Not on file  Social History Narrative  . Not on file   Social Determinants of Health   Financial Resource Strain: Low Risk   .  Difficulty of Paying Living Expenses: Not hard at all  Food Insecurity: No Food Insecurity  . Worried About Programme researcher, broadcasting/film/video in the Last Year: Never true  . Ran Out of Food in the Last Year: Never true  Transportation Needs: No Transportation Needs  . Lack of Transportation (Medical): No  . Lack of Transportation (Non-Medical): No  Physical Activity: Sufficiently Active  . Days of Exercise per Week: 7 days  . Minutes of Exercise per Session: 30 min  Stress: No Stress Concern Present  . Feeling of Stress : Not at all  Social Connections: Socially Integrated  . Frequency of Communication with Friends and Family: More than three times a week  . Frequency of Social Gatherings  with Friends and Family: More than three times a week  . Attends Religious Services: More than 4 times per year  . Active Member of Clubs or Organizations: Yes  . Attends Banker Meetings: More than 4 times per year  . Marital Status: Married  Catering manager Violence: Not At Risk  . Fear of Current or Ex-Partner: No  . Emotionally Abused: No  . Physically Abused: No  . Sexually Abused: No    Review of Systems: See HPI, otherwise negative ROS  Physical Exam: BP 127/73   Pulse 76   Temp (!) 96.2 F (35.7 C) (Temporal)   Resp 16   Ht 5\' 2"  (1.575 m)   Wt 111.6 kg   SpO2 100%   BMI 44.99 kg/m  General:   Alert,  pleasant and cooperative in NAD Head:  Normocephalic and atraumatic. Neck:  Supple; no masses or thyromegaly. Lungs:  Clear throughout to auscultation, normal respiratory effort.    Heart:  +S1, +S2, Regular rate and rhythm, No edema. Abdomen:  Soft, nontender and nondistended. Normal bowel sounds, without guarding, and without rebound.   Neurologic:  Alert and  oriented x4;  grossly normal neurologically.  Impression/Plan: is here for an colonoscopy to be performed for Screening colonoscopy average risk   Risks, benefits, limitations, and alternatives regarding  colonoscopy have been reviewed with the patient.  Questions have been answered.  All parties agreeable.   Ermalinda Barrios, MD  05/09/2020, 9:12 AM

## 2020-05-09 NOTE — Transfer of Care (Signed)
Immediate Anesthesia Transfer of Care Note  Patient: Kathy Price  Procedure(s) Performed: COLONOSCOPY WITH PROPOFOL (N/A )  Patient Location: Endoscopy Unit  Anesthesia Type:General  Level of Consciousness: drowsy  Airway & Oxygen Therapy: Patient Spontanous Breathing  Post-op Assessment: Report given to RN and Post -op Vital signs reviewed and stable  Post vital signs: Reviewed and stable  Last Vitals:  Vitals Value Taken Time  BP 101/66 05/09/20 0947  Temp 36.1 C 05/09/20 0941  Pulse 74 05/09/20 0947  Resp 20 05/09/20 0947  SpO2 100 % 05/09/20 0947  Vitals shown include unvalidated device data.  Last Pain:  Vitals:   05/09/20 0941  TempSrc: Tympanic  PainSc: Asleep         Complications: No complications documented.

## 2020-05-09 NOTE — Op Note (Signed)
[facility] Gastroenterology Patient Name: Kathy Price Procedure Date: 05/09/2020 9:21 AM MRN: [id number] Account #: [id number] Date of Birth: 1956-09-03 Admit Type: Outpatient Age: 64 Room: [facility] ENDO ROOM 4 Gender: Female Note Status: Finalized Procedure:             Colonoscopy Indications:           Screening in patient at increased risk: Family history                         of 1st-degree relative with colorectal cancer Providers:             Wyline Mood MD, MD Referring MD:          Onnie Boer. Sowles, MD (Referring MD) Medicines:             Monitored Anesthesia Care Complications:         No immediate complications. Procedure:             Pre-Anesthesia Assessment:                        - Prior to the procedure, a History and Physical was                         performed, and patient medications, allergies and                         sensitivities were reviewed. The patient's tolerance                         of previous anesthesia was reviewed.                        - The risks and benefits of the procedure and the                         sedation options and risks were discussed with the                         patient. All questions were answered and informed                         consent was obtained.                        - ASA Grade Assessment: III - A patient with severe                         systemic disease.                        After obtaining informed consent, the colonoscope was                         passed under direct vision. Throughout the procedure,                         the patient's blood pressure, pulse, and oxygen                         saturations were  monitored continuously. The                         Colonoscope was introduced through the anus and                         advanced to the the cecum, identified by the                         appendiceal orifice. The colonoscopy was performed                         with  ease. The patient tolerated the procedure well. Findings:      The perianal and digital rectal examinations were normal.      A few small-mouthed diverticula were found in the sigmoid colon.      The exam was otherwise without abnormality. Impression:            - Diverticulosis in the sigmoid colon.                        - The examination was otherwise normal.                        - No specimens collected. Recommendation:        - Discharge patient to home (with escort).                        - Resume previous diet.                        - Continue present medications.                        - Repeat colonoscopy in 5 years for screening purposes. Procedure Code(s):     --- Professional ---                        [phone], Colonoscopy, flexible; diagnostic, including                         collection of specimen(s) by brushing or washing, when                         performed (separate procedure) CPT copyright 2019 American Medical Association. All rights reserved. The codes documented in this report are preliminary and upon coder review may  be revised to meet current compliance requirements. Wyline Mood, MD Wyline Mood MD, MD 05/09/2020 9:41:08 AM This report has been signed electronically. Number of Addenda: 0 Note Initiated On: 05/09/2020 9:21 AM Scope Withdrawal Time: 0 hours 9 minutes 18 seconds  Total Procedure Duration: 0 hours 11 minutes 23 seconds  Estimated Blood Loss:  Estimated blood loss: none.      [facility]

## 2020-05-10 ENCOUNTER — Encounter: Payer: Self-pay | Admitting: Gastroenterology

## 2020-05-10 NOTE — Anesthesia Postprocedure Evaluation (Signed)
Anesthesia Post Note  Patient: Kathy Price  Procedure(s) Performed: COLONOSCOPY WITH PROPOFOL (N/A )  Patient location during evaluation: Endoscopy Anesthesia Type: General Level of consciousness: awake and alert Pain management: pain level controlled Vital Signs Assessment: post-procedure vital signs reviewed and stable Respiratory status: spontaneous breathing, nonlabored ventilation, respiratory function stable and patient connected to nasal cannula oxygen Cardiovascular status: blood pressure returned to baseline and stable Postop Assessment: no apparent nausea or vomiting Anesthetic complications: no   No complications documented.   Last Vitals:  Vitals:   05/09/20 1001 05/09/20 1011  BP: 114/79 135/83  Pulse: 66 62  Resp: 15 20  Temp:    SpO2: 100% 100%    Last Pain:  Vitals:   05/09/20 1011  TempSrc:   PainSc: 0-No pain                 Lenard Simmer

## 2020-05-24 ENCOUNTER — Other Ambulatory Visit: Payer: Self-pay

## 2020-05-24 ENCOUNTER — Ambulatory Visit (INDEPENDENT_AMBULATORY_CARE_PROVIDER_SITE_OTHER): Payer: Medicare Other | Admitting: Pharmacist

## 2020-05-24 DIAGNOSIS — E785 Hyperlipidemia, unspecified: Secondary | ICD-10-CM | POA: Diagnosis not present

## 2020-05-24 DIAGNOSIS — E669 Obesity, unspecified: Secondary | ICD-10-CM | POA: Diagnosis not present

## 2020-05-24 DIAGNOSIS — E1169 Type 2 diabetes mellitus with other specified complication: Secondary | ICD-10-CM | POA: Diagnosis not present

## 2020-05-24 NOTE — Chronic Care Management (AMB) (Signed)
Chronic Care Management Pharmacy  Name: Kathy Price  MRN: [id number] DOB: [birth year]  Chief Complaint/ HPI  Kathy Price,  64 y.o. , female presents for their Initial CCM visit with the clinical pharmacist via telephone due to COVID-19 Pandemic.  PCP : Kathy Sizer, MD  Their chronic conditions include: HF, HLD, DM  Office Visits: NA  Consult Visit: 7/12 colonoscopy, Kathy Price 6/17 CAD, Kathy Price, BP 130/88 P 82 Wt 244 BMI 46.1, start Jardiance 23m daily  Medications: Outpatient Encounter Medications as of 05/24/2020  Medication Sig  . aspirin EC 81 MG tablet Take 1 tablet by mouth daily.  .Marland Kitchenatorvastatin (LIPITOR) 40 MG tablet Take 1 tablet (40 mg total) by mouth daily.  . Cholecalciferol (VITAMIN D3) 2000 UNITS capsule Take 1 capsule by mouth daily.  . empagliflozin (JARDIANCE) 25 MG TABS tablet Take 25 mg by mouth daily.  . Multiple Vitamins-Minerals (WOMENS MULTIVITAMIN PO) Take 1 tablet by mouth.   . sacubitril-valsartan (ENTRESTO) 24-26 MG Take 1 tablet by mouth daily.  . carvedilol (COREG) 3.125 MG tablet Take 1 tablet by mouth 2 (two) times a day.  . spironolactone (ALDACTONE) 25 MG tablet Take by mouth.   No facility-administered encounter medications on file as of 05/24/2020.      Financial Resource Strain: Low Risk   . Difficulty of Paying Living Expenses: Not hard at all     Current Diagnosis/Assessment:  Goals Addressed            This Visit's Progress   . Chronic Care Management       CARE PLAN ENTRY (see longitudinal plan of care for additional care plan information)  Current Barriers:  . Chronic Disease Management support, education, and care coordination needs related to Hyperlipidemia, Diabetes, and Heart Failure   Hypertension BP Readings from Last 3 Encounters:  05/09/20 135/83  04/05/20 134/86  02/01/20 134/72   . Pharmacist Clinical Goal(s): o Over the next 90 days, patient will work with PharmD and providers to maintain BP goal  <130/80 . Current regimen:  o Carvedilol 3.1219mtwice daily o Spironolactone 2545maily . Interventions: o None . Patient self care activities - Over the next 90 days, patient will: o Check BP weekly, document, and provide at future appointments o Ensure daily salt intake < 2300 mg/day  Hyperlipidemia Lab Results  Component Value Date/Time   LDLCALC 59 02/16/2020 08:30 AM   . Pharmacist Clinical Goal(s): o Over the next 90 days, patient will work with PharmD and providers to maintain LDL goal < 70 . Current regimen:  o Lipitor 67m83mily . Interventions: o None . Patient self care activities - Over the next 90 days, patient will: o Continue to take atorvastatin 67mg30mly  Diabetes Lab Results  Component Value Date/Time   HGBA1C 6.2 02/01/2020 11:30 AM   HGBA1C 6.2 07/17/2019 08:47 AM   . Pharmacist Clinical Goal(s): o Over the next 90 days, patient will work with PharmD and providers to maintain A1c goal <7% . Current regimen:  o Jardiance 25mg 44my . Interventions: o Patient assistance program application . Patient self care activities - Over the next 90 days, patient will: o Check blood sugar once daily, document, and provide at future appointments o Contact provider with any episodes of hypoglycemia  Heart Failure . Pharmacist Clinical Goal(s) o Over the next 90 days, patient will work with PharmD and providers to ensure continued supply of Entresto . Current regimen:  o Entresto daily o Spironolactone 25mg d1m o  Carvedilol 3.125 mg twice daily o Jardiance 30m daily . Interventions: o Potential patient assistance program application for Entresto also . Patient self care activities - Over the next 90 days, patient will: o Continue positive lifestyle modifications  Medication management . Pharmacist Clinical Goal(s): o Over the next 90 days, patient will work with PharmD and providers to maintain optimal medication adherence . Current pharmacy:  [facility] . Interventions o Comprehensive medication review performed. o Continue current medication management strategy . Patient self care activities - Over the next 90 days, patient will: o Focus on medication adherence by [facility] o Take medications as prescribed o Report any questions or concerns to PharmD and/or provider(s)  Initial goal documentation       Diabetes   Recent Relevant Labs: Lab Results  Component Value Date/Time   HGBA1C 6.2 02/01/2020 11:30 AM   HGBA1C 6.2 07/17/2019 08:47 AM   MICROALBUR 0.8 07/17/2019 12:00 AM   MICROALBUR 20 01/15/2018 12:34 PM   MICROALBUR 20 01/22/2017 08:59 AM     Patient has failed these meds in past: NA Patient is currently controlled on the following medications: Jardiance 273mdaily  Last diabetic Foot exam:  Lab Results  Component Value Date/Time   HMDIABEYEEXA No Retinopathy 04/03/2018 12:00 AM    Last diabetic Eye exam: No results found for: HMDIABFOOTEX   We discussed: At goal Denies hypoglycemia Jardiance for CV benefit  Plan  Continue current medications   Hyperlipidemia   LDL goal < 70  Lipid Panel     Component Value Date/Time   CHOL 124 02/16/2020 0830   TRIG 100 02/16/2020 0830   HDL 46 (L) 02/16/2020 0830   LDLCALC 59 02/16/2020 0830    Hepatic Function Latest Ref Rng & Units 02/16/2020 03/05/2019 01/20/2018  Total Protein 6.1 - 8.1 g/dL 6.9 7.1 7.1  Albumin 3.5 - 5.0 g/dL - - -  AST 10 - 35 U/L 14 14 14   ALT 6 - 29 U/L 14 13 13   Alk Phosphatase 38 - 126 U/L - - -  Total Bilirubin 0.2 - 1.2 mg/dL 0.4 0.4 0.4     The ASCVD Risk score (GoLongville et al., 2013) failed to calculate for the following reasons:   The valid total cholesterol range is 130 to 320 mg/dL   Patient has failed these meds in past: NA Patient is currently controlled on the following medications:  . Lipitor 4049maily  We discussed: At  goal Denies myalgias  Plan  Continue current medications   Heart Failure   Patient has failed these meds in past: metoprolol bradycardia Patient is currently controlled on the following medications: Entresto, spironolactone, Coreg  We discussed: Tolerating Entresto well So far insurance is charging a satisfactory copay  Plan  Continue current medications  Medication Management   [facility] medications Uses pill box? Yes Pt endorses 100% compliance Zero copays and two important meds coming from [facility]  We discussed:  Has yeast infections but fixes them quickly Needs refill on Jardiance, no patient ID, [id number] need to file new Jardiance application  Plan  Complete Jardiance patient assistance program application Continue current medication management strategy  Follow up: 3 month phone visit  TedMilus HeightharmD, BCGP, CTTGreenwood Medical [phone]

## 2020-05-26 NOTE — Patient Instructions (Addendum)
Visit Information  Goals Addressed            This Visit's Progress   . Chronic Care Management       CARE PLAN ENTRY (see longitudinal plan of care for additional care plan information)  Current Barriers:  . Chronic Disease Management support, education, and care coordination needs related to Hyperlipidemia, Diabetes, and Heart Failure   Hypertension BP Readings from Last 3 Encounters:  05/09/20 135/83  04/05/20 134/86  02/01/20 134/72   . Pharmacist Clinical Goal(s): o Over the next 90 days, patient will work with PharmD and providers to maintain BP goal <130/80 . Current regimen:  o Carvedilol 3.125mg  twice daily o Spironolactone 25mg  daily . Interventions: o None . Patient self care activities - Over the next 90 days, patient will: o Check BP weekly, document, and provide at future appointments o Ensure daily salt intake < 2300 mg/day  Hyperlipidemia Lab Results  Component Value Date/Time   LDLCALC 59 02/16/2020 08:30 AM   . Pharmacist Clinical Goal(s): o Over the next 90 days, patient will work with PharmD and providers to maintain LDL goal < 70 . Current regimen:  o Lipitor 40mg  daily . Interventions: o None . Patient self care activities - Over the next 90 days, patient will: o Continue to take atorvastatin 40mg  daily  Diabetes Lab Results  Component Value Date/Time   HGBA1C 6.2 02/01/2020 11:30 AM   HGBA1C 6.2 07/17/2019 08:47 AM   . Pharmacist Clinical Goal(s): o Over the next 90 days, patient will work with PharmD and providers to maintain A1c goal <7% . Current regimen:  o Jardiance 25mg  daily . Interventions: o Patient assistance program application . Patient self care activities - Over the next 90 days, patient will: o Check blood sugar once daily, document, and provide at future appointments o Contact provider with any episodes of hypoglycemia  Heart Failure . Pharmacist Clinical Goal(s) o Over the next 90 days, patient will work with  PharmD and providers to ensure continued supply of Entresto . Current regimen:  o Entresto daily o Spironolactone 25mg  daily o Carvedilol 3.125 mg twice daily o Jardiance 25mg  daily . Interventions: o Potential patient assistance program application for Entresto also . Patient self care activities - Over the next 90 days, patient will: o Continue positive lifestyle modifications  Medication management . Pharmacist Clinical Goal(s): o Over the next 90 days, patient will work with PharmD and providers to maintain optimal medication adherence . Current pharmacy: [facility] . Interventions o Comprehensive medication review performed. o Continue current medication management strategy . Patient self care activities - Over the next 90 days, patient will: o Focus on medication adherence by bringing [facility] documents [facility] for patient assistance application(s) o Take medications as prescribed o Report any questions or concerns to PharmD and/or provider(s)  Initial goal documentation        Kathy Price was given information about Chronic Care Management services today including:  1. CCM service includes personalized support from designated clinical staff supervised by her physician, including individualized plan of care and coordination with other care providers 2. 24/7 contact phone numbers for assistance for urgent and routine care needs. 3. Standard insurance, coinsurance, copays and deductibles apply for chronic care management only during months in which we provide at least 20 minutes of these services. Most insurances cover these services at 100%, however patients may be responsible for any copay, coinsurance and/or deductible if applicable. This service may help you avoid the  need for more expensive face-to-face services. 4. Only one practitioner may furnish and bill the service in a calendar month. 5. The patient may stop CCM services at any time (effective at the end  of the month) by phone call to the office staff.  Patient agreed to services and verbal consent obtained.   Print copy of patient instructions provided.  Telephone follow up appointment with pharmacy team member scheduled for: 3 months  Felton Clinton, PharmD, [place], CTTS Clinical Pharmacist [facility] [phone]  Atherosclerosis  Atherosclerosis is narrowing and hardening of the arteries. Arteries are blood vessels that carry blood from the heart to all parts of the body. This blood contains oxygen. Arteries can become narrow or clogged with a buildup of fat, cholesterol, calcium, and other substances (plaque). Plaque decreases the amount of blood that can flow through the artery. Atherosclerosis can affect any artery in the body, including:  Heart arteries (coronary artery disease). This may cause a heart attack.  Brain arteries. This may cause a stroke (cerebrovascular accident).  Leg, arm, and pelvis arteries (peripheral artery disease). This may cause pain and numbness.  Kidney arteries. This may cause kidney (renal) failure. Treatment may slow the disease and prevent further damage to the heart, brain, peripheral arteries, and kidneys. What are the causes? Atherosclerosis develops slowly over many years. The inner layers of your arteries become damaged and allow the gradual buildup of plaque. The exact cause of atherosclerosis is not fully understood. Symptoms of atherosclerosis do not occur until the artery becomes narrow or blocked. What increases the risk? The following factors may make you more likely to develop this condition:  High blood pressure.  High cholesterol.  Being middle-aged or older.  Having a family history of atherosclerosis.  Having high blood fats (triglycerides).  Diabetes.  Being overweight.  Smoking tobacco.  Not exercising enough (sedentary lifestyle).  Having a substance in the blood called C-reactive protein (CRP). This is  a sign of increased levels of inflammation in the body.  Sleep apnea.  Being stressed.  Drinking too much alcohol. What are the signs or symptoms? This condition may not cause any symptoms. If you have symptoms, they are caused by damage to an area of your body that is not getting enough blood.  Coronary artery disease may cause chest pain and shortness of breath.  Decreased blood supply to your brain may cause a stroke. Signs of a stroke may include sudden: ? Weakness on one side of the body. ? Confusion. ? Changes in vision. ? Inability to speak or understand speech. ? Loss of balance, coordination, or the ability to walk. ? Severe headache. ? Loss of consciousness.  Peripheral arterial disease may cause pain and numbness, often in the legs and hips.  Renal failure may cause fatigue, nausea, swelling, and itchy skin. How is this diagnosed? This condition is diagnosed based on your medical history and a physical exam. During the exam:  Your health care provider will: ? Check your pulse in different places. ? Listen for a "whooshing" sound over your arteries (bruit).  You may have tests, such as: ? Blood tests to check your levels of cholesterol, triglycerides, and CRP. ? Electrocardiogram (ECG) to check for heart damage. ? Chest X-ray to see if you have an enlarged heart, which is a sign of heart failure. ? Stress test to see how your heart reacts to exercise. ? Echocardiogram to get images of the inside of your heart. ? Ankle-brachial index to compare blood  pressure in your arms to blood pressure in your ankles. ? Ultrasound of your peripheral arteries to check blood flow. ? CT scan to check for damage to your heart or brain. ? X-rays of blood vessels after dye has been injected (angiogram) to check blood flow. How is this treated? Treatment starts with lifestyle changes, which may include:  Changing your diet.  Losing weight.  Reducing stress.  Exercising and  being physically active more regularly.  Not smoking. You may also need medicine to:  Lower triglycerides and cholesterol.  Control blood pressure.  Prevent blood clots.  Lower inflammation in your body.  Control your blood sugar. Sometimes, surgery is needed to:  Remove plaque from an artery (endarterectomy).  Open or widen a narrowed heart artery (angioplasty).  Create a new path for your blood with one of these procedures: ? Heart (coronary) artery bypass graft surgery. ? Peripheral artery bypass graft surgery. Follow these instructions at home: Eating and drinking   Eat a heart-healthy diet. Talk with your health care provider or a diet and nutrition specialist (dietitian) if you need help. A heart-healthy diet involves: ? Limiting unhealthy fats and increasing healthy fats. Some examples of healthy fats are olive oil and canola oil. ? Eating plant-based foods, such as fruits, vegetables, nuts, whole grains, and legumes (such as peas and lentils).  Limit alcohol intake to no more than 1 drink a day for nonpregnant women and 2 drinks a day for men. One drink equals 12 oz of beer, 5 oz of wine, or 1 oz of hard liquor. Lifestyle  Follow an exercise program as told by your health care provider.  Maintain a healthy weight. Lose weight if your health care provider says that you need to do that.  Rest when you are tired.  Learn to manage your stress.  Do not use any products that contain nicotine or tobacco, such as cigarettes and e-cigarettes. If you need help quitting, ask your health care provider.  Do not abuse drugs. General instructions  Take over-the-counter and prescription medicines only as told by your health care provider.  Manage other health conditions as told by your health care provider.  Keep all follow-up visits as told by your health care provider. This is important. Contact a health care provider if:  You have chest pain or discomfort. This  includes squeezing chest pain that may feel like indigestion (angina).  You have shortness of breath.  You have an irregular heartbeat.  You have unexplained fatigue.  You have unexplained pain or numbness in an arm, leg, or hip.  You have nausea, swelling of your hands or feet, and itchy skin. Get help right away if:  You have any symptoms of a heart attack, such as: ? Chest pain. ? Shortness of breath. ? Pain in your neck, jaw, arms, back, or stomach. ? Cold sweat. ? Nausea. ? Light-headedness.  You have any symptoms of a stroke. "BE FAST" is an easy way to remember the main warning signs of a stroke: ? B - Balance. Signs are dizziness, sudden trouble walking, or loss of balance. ? E - Eyes. Signs are trouble seeing or a sudden change in vision. ? F - Face. Signs are sudden weakness or numbness of the face, or the face or eyelid drooping on one side. ? A - Arms. Signs are weakness or numbness in an arm. This happens suddenly and usually on one side of the body. ? S - Speech. Signs are sudden trouble speaking,  slurred speech, or trouble understanding what people say. ? T - Time. Time to call emergency services. Write down what time symptoms started.  You have other signs of a stroke, such as: ? A sudden, severe headache with no known cause. ? Nausea or vomiting. ? Seizure. These symptoms may represent a serious problem that is an emergency. Do not wait to see if the symptoms will go away. Get medical help right away. Call your local emergency services (911 in the [place] Do not drive yourself to the hospital. Summary  Atherosclerosis is narrowing and hardening of the arteries.  Arteries can become narrow or clogged with a buildup of fat, cholesterol, calcium, and other substances (plaque).  This condition may not cause any symptoms. If you do have symptoms, they are caused by damage to an area of your body that is not getting enough blood.  Treatment may include lifestyle  changes and medicines. In some cases, surgery is needed. This information is not intended to replace advice given to you by your health care provider. Make sure you discuss any questions you have with your health care provider. Document Revised: 01/24/2018 Document Reviewed: 06/20/2017 Elsevier Patient Education  2020 [employer].

## 2020-08-01 NOTE — Progress Notes (Signed)
Name: Kathy Price   MRN: [id number]    DOB: 1956/09/09   Date:08/03/2020       Progress Note  Subjective  Chief Complaint  Chief Complaint  Patient presents with  . Follow-up  . Diabetes    HPI  DMII: shewason Xigduo since August 2017, hgbA1C was 6.6%, down 5.9% followed by6.4%,6.4%and it went up toto 7.0%,6.1%, 6.2%,6.2 % and today is 6.5 % We changed to [place] and Ozempic 12/2017, she had lost 15 lbs with combo, but lost herjob and is currently only on Jardiance through the assistance program. She has not been checking her glucose lately. She denies polyphagia, polydipsia or polyuria.Urine micro today is elevated, she is on low dose valsartan but likely not enough  CAD: s/p MI and stent placement 08/2001, seesDr. Gwen Pounds , last visit 03/2020  andshehas ischemic cardiomyopathy with left wall hypokinesis. Stress test only showed old lesion, no acute ischemia She has been taking apirin 81 mg daily,off lisinopril and is  now Entresto, spironolactone , carvediloland atorvastatin.Last EKG at cardiologist showed frequent PVC's. She denies SOB ,diaphoresis, orthopnea or edema.   INTERPRETATION 08/2019  MOD LV SYSTOLIC DYSFUNCTION WITH AN ESTIMATED EF = 35-40 % NORMAL RIGHT VENTRICULAR SYSTOLIC FUNCTION MILD TRICUSPID AND MITRAL VALVE INSUFFICIENCY TRACE AORTIC VALVE INSUFFICIENCY NO VALVULAR STENOSIS MILD LV ENLARGEMENT MILD LA ENLARGEMENT  GERD:she had EGD done by Dr. Tobi Bastos back 10/2016 and was treated for h. Pylori gastritis. She is nolonger having indigestion or epigastric pain,she states very seldom has a flare and takes prn otc Tums or Maalox, unchanged   Morbid Obesity:she gained 20 lbs from  07/2018 until 2021 and 2 lbs since last visit with me she stopped working August 2019 and has not been as active, states also gained more since COVID. She states she will try to increase physical activity, also advised to cut down on carbohydrates. She states  she likes bread and also fruit cups and will to cut down on the amount and frequency   Hyperlipidemia:taking Atorvastatin and is tolerating well.No muscle aches. Reviewed labs   Patient Active Problem List   Diagnosis Date Noted  . Helicobacter pylori gastritis 11/06/2016  . Cardiomyopathy, ischemic 01/30/2016  . CAD S/P percutaneous coronary angioplasty 09/07/2015  . Diabetes mellitus type 2 in obese (HCC) 09/07/2015  . Dyslipidemia 09/07/2015  . H/O: hysterectomy 09/07/2015  . H/O acute myocardial infarction 09/07/2015  . Vitamin D deficiency 09/07/2015  . Morbid obesity (HCC) 09/07/2015  . Bradycardia 05/13/2014  . Arteriosclerosis of coronary artery 05/13/2014  . Essential (primary) hypertension 05/13/2014  . UARS (upper airway resistance syndrome) 01/23/2013    Past Surgical History:  Procedure Laterality Date  . ABDOMINAL HYSTERECTOMY    . COLONOSCOPY    . COLONOSCOPY WITH PROPOFOL N/A 05/09/2020   Procedure: COLONOSCOPY WITH PROPOFOL;  Surgeon: Wyline Mood, MD;  Location: [facility] ENDOSCOPY;  Service: Gastroenterology;  Laterality: N/A;  . CORONARY ANGIOPLASTY  2002   stents  . ESOPHAGOGASTRODUODENOSCOPY (EGD) WITH PROPOFOL N/A 11/05/2016   Procedure: ESOPHAGOGASTRODUODENOSCOPY (EGD) WITH PROPOFOL;  Surgeon: Wyline Mood, MD;  Location: [facility] ENDOSCOPY;  Service: Endoscopy;  Laterality: N/A;    Family History  Problem Relation Age of Onset  . Diabetes Mother   . Hypertension Mother   . Cancer Father   . CAD Brother   . Breast cancer Sister 62    Social History   Tobacco Use  . Smoking status: Former Smoker    Packs/day: 1.00    Years: 25.00    Pack  years: 25.00    Types: Cigarettes    Quit date: 09/06/2000    Years since quitting: 19.9  . Smokeless tobacco: Never Used  Substance Use Topics  . Alcohol use: No    Alcohol/week: 0.0 standard drinks     Current Outpatient Medications:  .  aspirin EC 81 MG tablet, Take 1 tablet by mouth daily., Disp: , Rfl:  .   atorvastatin (LIPITOR) 40 MG tablet, Take 1 tablet (40 mg total) by mouth daily., Disp: 90 tablet, Rfl: 1 .  Cholecalciferol (VITAMIN D3) 2000 UNITS capsule, Take 1 capsule by mouth daily., Disp: , Rfl:  .  empagliflozin (JARDIANCE) 25 MG TABS tablet, Take 25 mg by mouth daily., Disp: 90 tablet, Rfl: 3 .  Multiple Vitamins-Minerals (WOMENS MULTIVITAMIN PO), Take 1 tablet by mouth. , Disp: , Rfl:  .  sacubitril-valsartan (ENTRESTO) 24-26 MG, Take 1 tablet by mouth daily., Disp: , Rfl:  .  carvedilol (COREG) 3.125 MG tablet, Take 1 tablet by mouth 2 (two) times a day., Disp: , Rfl:  .  spironolactone (ALDACTONE) 25 MG tablet, Take by mouth., Disp: , Rfl:   Allergies  Allergen Reactions  . Metoprolol Other (See Comments)    severe bradycardia  . Pantoprazole Rash  . Sulfa Antibiotics Rash and Hives    I personally reviewed active problem list, medication list, allergies, family history, social history, health maintenance with the patient/caregiver today.   ROS  Constitutional: Negative for fever or weight change.  Respiratory: Negative for cough and shortness of breath.   Cardiovascular: Negative for chest pain or palpitations.  Gastrointestinal: Negative for abdominal pain, no bowel changes.  Musculoskeletal: Negative for gait problem or joint swelling.  Skin: Negative for rash.  Neurological: Negative for dizziness or headache.  No other specific complaints in a complete review of systems (except as listed in HPI above).  Objective  Vitals:   08/03/20 1016  BP: 132/82  Pulse: 88  Resp: 14  Temp: 98.6 F (37 C)  SpO2: 100%  Weight: 249 lb 8 oz (113.2 kg)  Height: 5\' 2"  (1.575 m)    Body mass index is 45.63 kg/m.  Physical Exam  Constitutional: Patient appears well-developed and well-nourished. Obese No distress.  HEENT: head atraumatic, normocephalic, pupils equal and reactive to light, neck supple Cardiovascular: Normal rate, regular rhythm and normal heart sounds.   No murmur heard. No BLE edema. Pulmonary/Chest: Effort normal and breath sounds normal. No respiratory distress. Abdominal: Soft.  There is no tenderness. Psychiatric: Patient has a normal mood and affect. behavior is normal. Judgment and thought content normal.  Recent Results (from the past 2160 hour(s))  SARS CORONAVIRUS 2 (TAT 6-24 HRS) Nasopharyngeal Nasopharyngeal Swab     Status: None   Collection Time: 05/05/20 11:11 AM   Specimen: Nasopharyngeal Swab  Result Value Ref Range   SARS Coronavirus 2 NEGATIVE NEGATIVE    Comment: (NOTE) SARS-CoV-2 target nucleic acids are NOT DETECTED.  The SARS-CoV-2 RNA is generally detectable in upper and lower respiratory specimens during the acute phase of infection. Negative results do not preclude SARS-CoV-2 infection, do not rule out co-infections with other pathogens, and should not be used as the sole basis for treatment or other patient management decisions. Negative results must be combined with clinical observations, patient history, and epidemiological information. The expected result is Negative.  Fact Sheet for Patients: 07/06/20  Fact Sheet for Healthcare Providers: HairSlick.no  This test is not yet approved or cleared by the quierodirigir.com FDA and  has been authorized for detection and/or diagnosis of SARS-CoV-2 by FDA under an Emergency Use Authorization (EUA). This EUA will remain  in effect (meaning this test can be used) for the duration of the COVID-19 declaration under Se ction 564(b)(1) of the Act, 21 U.S.C. section 360bbb-3(b)(1), unless the authorization is terminated or revoked sooner.  Performed at [facility], [address] [place], [place] [address]   Glucose, capillary     Status: Abnormal   Collection Time: 05/09/20  8:32 AM  Result Value Ref Range   Glucose-Capillary 121 (H) 70 - 99 mg/dL    Comment: Glucose reference range applies  only to samples taken after fasting for at least 8 hours.  POCT HgB A1C     Status: Abnormal   Collection Time: 08/03/20 10:25 AM  Result Value Ref Range   Hemoglobin A1C 6.5 (A) 4.0 - 5.6 %   HbA1c POC (<> result, manual entry)     HbA1c, POC (prediabetic range)     HbA1c, POC (controlled diabetic range)    POCT UA - Microalbumin     Status: Abnormal   Collection Time: 08/03/20 10:33 AM  Result Value Ref Range   Microalbumin Ur, POC 100 mg/L   Creatinine, POC     Albumin/Creatinine Ratio, Urine, POC      Diabetic Foot Exam: Diabetic Foot Exam - Simple   Simple Foot Form Diabetic Foot exam was performed with the following findings: Yes 08/03/2020 10:49 AM  Visual Inspection No deformities, no ulcerations, no other skin breakdown bilaterally: Yes Sensation Testing Intact to touch and monofilament testing bilaterally: Yes Pulse Check Posterior Tibialis and Dorsalis pulse intact bilaterally: Yes Comments      PHQ2/9: Depression screen [facility] 2/9 08/03/2020 04/05/2020 02/01/2020 07/17/2019 03/16/2019  Decreased Interest 0 0 0 3 0  Down, Depressed, Hopeless 0 0 0 0 0  PHQ - 2 Score 0 0 0 3 0  Altered sleeping 0 0 0 0 -  Tired, decreased energy 0 0 0 0 -  Change in appetite 0 0 0 0 -  Feeling bad or failure about yourself  0 0 0 0 -  Trouble concentrating 0 0 0 0 -  Moving slowly or fidgety/restless 0 0 0 0 -  Suicidal thoughts 0 0 0 0 -  PHQ-9 Score 0 0 0 3 -  Difficult doing work/chores Not difficult at all - Not difficult at all Not difficult at all -    phq 9 is negative   Fall Risk: Fall Risk  08/03/2020 04/05/2020 02/01/2020 07/17/2019 03/04/2019  Falls in the past year? 0 0 0 0 0  Number falls in past yr: 0 0 0 0 0  Injury with Fall? 0 0 0 0 0     Functional Status Survey: Is the patient deaf or have difficulty hearing?: No Does the patient have difficulty seeing, even when wearing glasses/contacts?: No Does the patient have difficulty concentrating, remembering, or making  decisions?: No Does the patient have difficulty walking or climbing stairs?: No Does the patient have difficulty dressing or bathing?: No Does the patient have difficulty doing errands alone such as visiting a doctor's office or shopping?: No    Assessment & Plan  1. Diabetes mellitus type 2 in obese (HCC)  - POCT HgB A1C - POCT UA - Microalbumin  2. Need for influenza vaccination  - Flu Vaccine QUAD High Dose(Fluad)  3. Dyslipidemia   4. Dyslipidemia associated with type 2 diabetes mellitus (HCC)  5. CAD S/P percutaneous coronary angioplasty   6. Morbid obesity (HCC)  Discussed with the patient the risk posed by an increased BMI. Discussed importance of portion control, calorie counting and at least 150 minutes of physical activity weekly. Avoid sweet beverages and drink more water. Eat at least 6 servings of fruit and vegetables daily   7. Essential (primary) hypertension   8. Cardiomyopathy, unspecified type (HCC)   9. Gastroesophageal reflux disease without esophagitis  Doing well at this time

## 2020-08-03 ENCOUNTER — Ambulatory Visit (INDEPENDENT_AMBULATORY_CARE_PROVIDER_SITE_OTHER): Payer: Medicare Other | Admitting: Family Medicine

## 2020-08-03 ENCOUNTER — Encounter: Payer: Self-pay | Admitting: Family Medicine

## 2020-08-03 ENCOUNTER — Other Ambulatory Visit: Payer: Self-pay

## 2020-08-03 VITALS — BP 132/82 | HR 88 | Temp 98.6°F | Resp 14 | Ht 62.0 in | Wt 249.5 lb

## 2020-08-03 DIAGNOSIS — I10 Essential (primary) hypertension: Secondary | ICD-10-CM

## 2020-08-03 DIAGNOSIS — I2510 Atherosclerotic heart disease of native coronary artery without angina pectoris: Secondary | ICD-10-CM | POA: Diagnosis not present

## 2020-08-03 DIAGNOSIS — Z9861 Coronary angioplasty status: Secondary | ICD-10-CM

## 2020-08-03 DIAGNOSIS — E785 Hyperlipidemia, unspecified: Secondary | ICD-10-CM | POA: Diagnosis not present

## 2020-08-03 DIAGNOSIS — E1169 Type 2 diabetes mellitus with other specified complication: Secondary | ICD-10-CM

## 2020-08-03 DIAGNOSIS — E669 Obesity, unspecified: Secondary | ICD-10-CM

## 2020-08-03 DIAGNOSIS — I429 Cardiomyopathy, unspecified: Secondary | ICD-10-CM

## 2020-08-03 DIAGNOSIS — Z23 Encounter for immunization: Secondary | ICD-10-CM

## 2020-08-03 DIAGNOSIS — K219 Gastro-esophageal reflux disease without esophagitis: Secondary | ICD-10-CM

## 2020-08-03 LAB — POCT UA - MICROALBUMIN: Microalbumin Ur, POC: 100 mg/L

## 2020-08-03 LAB — POCT GLYCOSYLATED HEMOGLOBIN (HGB A1C): Hemoglobin A1C: 6.5 % — AB (ref 4.0–5.6)

## 2020-08-26 ENCOUNTER — Telehealth: Payer: Self-pay | Admitting: *Deleted

## 2020-08-26 NOTE — Chronic Care Management (AMB) (Signed)
  Care Management   Note  08/26/2020 Name: Kathy Price MRN: [id number] DOB: [birth year]  Kathy Price is a 64 y.o. year old female who is a primary care patient of Alba Cory, MD and is actively engaged with the care management team. I reached out to Kathy Price by phone today to assist with canceling a follow up visit with the Pharmacist  Follow up plan: Unsuccessful telephone outreach attempt made. A HIPAA compliant phone message was left for the patient providing contact information and requesting a return call. If patient returns call to provider office, please advise to call Embedded Care Management Care Guide Gwenevere Ghazi at [phone]  [facility] Guide, Embedded Care Coordination [facility] Management

## 2020-08-30 ENCOUNTER — Ambulatory Visit: Payer: Self-pay

## 2020-09-27 NOTE — Chronic Care Management (AMB) (Signed)
  Care Management   Note  09/27/2020 Name: [name] MRN: [id number] DOB: 21-Feb-1956  Kathy Price is a 64 y.o. year old female who is a primary care patient of Alba Cory, MD and is actively engaged with the care management team. I reached out to Kathy Price by phone today to assist with re-scheduling a follow up visit with the Pharmacist  Follow up plan: Face to Face appointment with care management team member scheduled for: 10/12/2020  [facility] Guide, Embedded Care Coordination [facility] Management  Direct Dial [phone]

## 2020-10-10 ENCOUNTER — Telehealth: Payer: Self-pay | Admitting: *Deleted

## 2020-10-10 NOTE — Chronic Care Management (AMB) (Signed)
  Care Management   Note  10/10/2020 Name: Kathy Price MRN: [id number] DOB: 1956/02/26  Kathy Price is a 64 y.o. year old female who is a primary care patient of Alba Cory, MD and is actively engaged with the care management team. I reached out to Kathy Price by phone today to assist with re-scheduling a follow up visit with the Pharmacist  Follow up plan: Unsuccessful telephone outreach attempt made. A HIPAA compliant phone message was left for the patient providing contact information and requesting a return call.  The care management team will reach out to the patient again over the next 3 days.  If patient returns call to provider office, please advise to call Embedded Care Management Care Guide Gwenevere Ghazi at [phone]  [facility] Guide, Embedded Care Coordination [facility] Management

## 2020-10-12 ENCOUNTER — Telehealth: Payer: Self-pay

## 2020-10-12 ENCOUNTER — Ambulatory Visit: Payer: Medicare Other

## 2020-10-12 NOTE — Chronic Care Management (AMB) (Signed)
  Care Management   Note  10/12/2020 Name: Kathy Price MRN: [id number] DOB: 09-14-1956  Kathy Price is a 64 y.o. year old female who is a primary care patient of Alba Cory, MD and is actively engaged with the care management team. I reached out to Kathy Price by phone today to assist with re-scheduling a follow up visit with the Pharmacist  Follow up plan: Telephone appointment with care management team member scheduled for:11/03/2020  [facility] Guide, Embedded Care Coordination [facility] Management

## 2020-10-12 NOTE — Telephone Encounter (Signed)
Thank you :)

## 2020-10-12 NOTE — Telephone Encounter (Signed)
Alex called patient rescheduled for 11/03/2020 for telephone call with you  [facility] Guide, Embedded Care Coordination [facility]  Care Management  Direct Dial: [phone]

## 2020-10-12 NOTE — Telephone Encounter (Signed)
Can you give the patient a call about rescheduling?   Thanks, Garey Ham Clinical Pharmacist [facility] [phone]

## 2020-10-12 NOTE — Telephone Encounter (Signed)
Copied from CRM [id number]. Topic: Appointment Scheduling - Scheduling Inquiry for Clinic >> Oct 12, 2020 11:54 AM Marylen Ponto wrote: Reason for CRM: Pt requests to reschedule pharmacy appt. Cb# [phone]

## 2020-11-03 ENCOUNTER — Ambulatory Visit: Payer: Medicare Other

## 2020-11-03 ENCOUNTER — Telehealth: Payer: Self-pay

## 2020-11-03 DIAGNOSIS — E1169 Type 2 diabetes mellitus with other specified complication: Secondary | ICD-10-CM

## 2020-11-03 DIAGNOSIS — I255 Ischemic cardiomyopathy: Secondary | ICD-10-CM

## 2020-11-03 DIAGNOSIS — I10 Essential (primary) hypertension: Secondary | ICD-10-CM

## 2020-11-03 NOTE — Patient Instructions (Signed)
Visit Information It was great speaking with you today!  Please let me know if you have any questions about our visit. Goals Addressed            This Visit's Progress   . Chronic Care Management       CARE PLAN ENTRY (see longitudinal plan of care for additional care plan information)  Current Barriers:  . Chronic Disease Management support, education, and care coordination needs related to Hypertension, Hyperlipidemia, Diabetes, Heart Failure, Coronary Artery Disease, and GERD   Hypertension BP Readings from Last 3 Encounters:  05/09/20 135/83  04/05/20 134/86  02/01/20 134/72   . Pharmacist Clinical Goal(s): o Over the next 90 days, patient will work with PharmD and providers to maintain BP goal <130/80 . Current regimen:  o Carvedilol 3.125mg  twice daily o Entresto 24-26 mg daily  o Spironolactone 25mg  daily . Interventions: o None . Patient self care activities - Over the next 90 days, patient will: o Check BP weekly, document, and provide at future appointments o Ensure daily salt intake < 2300 mg/day  Hyperlipidemia Lab Results  Component Value Date/Time   LDLCALC 59 02/16/2020 08:30 AM   . Pharmacist Clinical Goal(s): o Over the next 90 days, patient will work with PharmD and providers to maintain LDL goal < 70 . Current regimen:  o Atorvastatin 40mg  daily . Interventions: o None . Patient self care activities - Over the next 90 days, patient will:  Diabetes Lab Results  Component Value Date/Time   HGBA1C 6.2 02/01/2020 11:30 AM   HGBA1C 6.2 07/17/2019 08:47 AM   . Pharmacist Clinical Goal(s): o Over the next 90 days, patient will work with PharmD and providers to maintain A1c goal <7% . Current regimen:  o Jardiance 25mg  daily . Interventions: o Patient assistance program application . Patient self care activities - Over the next 90 days, patient will: o Check blood sugar once daily, document, and provide at future appointments o Contact provider  with any episodes of hypoglycemia  Medication management . Pharmacist Clinical Goal(s): o Over the next 90 days, patient will work with PharmD and providers to maintain optimal medication adherence . [facility] . Interventions o Comprehensive medication review performed. o Continue current medication management strategy . Patient self care activities - Over the next 90 days, patient will: o Focus on medication adherence by bringing financial documents to [facility] for patient assistance application(s) o Take medications as prescribed o Report any questions or concerns to PharmD and/or provider(s)       The patient verbalized understanding of instructions, educational materials, and care plan provided today and declined offer to receive copy of patient instructions, educational materials, and care plan.   Telephone follow up appointment with pharmacy team member scheduled for: 05/04/21 at 9:00 AM  Clinical Pharmacist [facility] [phone]

## 2020-11-03 NOTE — Chronic Care Management (AMB) (Signed)
Chronic Care Management Pharmacy  Name: Kathy Price  MRN: [id number] DOB: 24-Oct-1956  Chief Complaint/ HPI  Kathy Price,  65 y.o. , female presents for their Follow-Up CCM visit with the clinical pharmacist via telephone.  PCP : Kathy Sizer, MD  Their chronic conditions include: Hypertension, Hyperlipidemia, Diabetes, Heart Failure, Coronary Artery Disease, and GERD  Office Visits:  08/03/20: Patient presented to Dr. Ancil Price for follow-up. A1c 6.5%.   Consult Visit: 04/14/20: CAD, Kathy Price, BP 130/88 P 82 Wt 244 BMI 46.1, start Jardiance 25m daily  Medications: Outpatient Encounter Medications as of 11/03/2020  Medication Sig  . aspirin EC 81 MG tablet Take 1 tablet by mouth daily.  .Marland Kitchenatorvastatin (LIPITOR) 40 MG tablet Take 1 tablet (40 mg total) by mouth daily.  . carvedilol (COREG) 3.125 MG tablet Take 1 tablet by mouth 2 (two) times a day.  . Cholecalciferol (VITAMIN D3) 2000 UNITS capsule Take 1 capsule by mouth daily.  . empagliflozin (JARDIANCE) 25 MG TABS tablet Take 25 mg by mouth daily.  . Multiple Vitamins-Minerals (WOMENS MULTIVITAMIN PO) Take 1 tablet by mouth.   . sacubitril-valsartan (ENTRESTO) 24-26 MG Take 1 tablet by mouth daily.  .Marland Kitchenspironolactone (ALDACTONE) 25 MG tablet Take by mouth.   No facility-administered encounter medications on file as of 11/03/2020.   Current Diagnosis/Assessment:  SDOH Interventions   Flowsheet Row Most Recent Value  SDOH Interventions   Financial Strain Interventions Other (Comment)  [Will start PAP]  Transportation Interventions Intervention Not Indicated      Goals Addressed            This Visit's Progress   . Chronic Care Management       CARE PLAN ENTRY (see longitudinal plan of care for additional care plan information)  Current Barriers:  . Chronic Disease Management support, education, and care coordination needs related to Hypertension, Hyperlipidemia, Diabetes, Heart Failure, Coronary Artery  Disease, and GERD   Hypertension BP Readings from Last 3 Encounters:  05/09/20 135/83  04/05/20 134/86  02/01/20 134/72   . Pharmacist Clinical Goal(s): o Over the next 90 days, patient will work with PharmD and providers to maintain BP goal <130/80 . Current regimen:  o Carvedilol 3.1267mtwice daily o Entresto 24-26 mg daily  o Spironolactone 25102maily . Interventions: o None . Patient self care activities - Over the next 90 days, patient will: o Check BP weekly, document, and provide at future appointments o Ensure daily salt intake < 2300 mg/day  Hyperlipidemia Lab Results  Component Value Date/Time   LDLCALC 59 02/16/2020 08:30 AM   . Pharmacist Clinical Goal(s): o Over the next 90 days, patient will work with PharmD and providers to maintain LDL goal < 70 . Current regimen:  o Atorvastatin 27m22mily . Interventions: o None . Patient self care activities - Over the next 90 days, patient will:  Diabetes Lab Results  Component Value Date/Time   HGBA1C 6.2 02/01/2020 11:30 AM   HGBA1C 6.2 07/17/2019 08:47 AM   . Pharmacist Clinical Goal(s): o Over the next 90 days, patient will work with PharmD and providers to maintain A1c goal <7% . Current regimen:  o Jardiance 25mg11mly . Interventions: o Patient assistance program application . Patient self care activities - Over the next 90 days, patient will: o Check blood sugar once daily, document, and provide at future appointments o Contact provider with any episodes of hypoglycemia  Medication management . Pharmacist Clinical Goal(s): o Over the next 90 days,  patient will work with PharmD and providers to maintain optimal medication adherence . [facility] . Interventions o Comprehensive medication review performed. o Continue current medication management strategy . Patient self care activities - Over the next 90 days, patient will: o Focus on medication adherence by bringing financial documents to  [facility] for patient assistance application(s) o Take medications as prescribed o Report any questions or concerns to PharmD and/or provider(s)      Hypertension   BP goal is:  <130/80  Office blood pressures are  BP Readings from Last 3 Encounters:  08/03/20 132/82  05/09/20 135/83  04/05/20 134/86   Patient checks BP at home infrequently Patient home BP readings are ranging: NA  Patient has failed these meds in the past: NA Patient is currently controlled on the following medications:  . Carvedilol 3.125 mg twice daily  . Entresto 24-26 mg daily  . Spironolactone 25 mg daily   We discussed diet and exercise extensively. Patient PAP for Entresto not renewed for 2022. She has follow-up with Cardiology next week and will be picking up paperwork for [facility] then.   Plan  Continue current medications   Heart Failure   Type: Systolic  Last ejection fraction: 35-40%   Patient has failed these meds in past: NA Patient is currently controlled on the following medications:  . Carvedilol 3.125 mg twice daily  . Entresto 24-26 mg daily  . Spironolactone 25 mg daily   We discussed weighing daily; if you gain more than 3 pounds in one day or 5 pounds in one week call your doctor  Plan  Continue current medications    Hyperlipidemia   CAD S/P percutaneous coronary angioplasty LDL goal < 70  Lipid Panel     Component Value Date/Time   CHOL 124 02/16/2020 0830   TRIG 100 02/16/2020 0830   HDL 46 (L) 02/16/2020 0830   LDLCALC 59 02/16/2020 0830    Hepatic Function Latest Ref Rng & Units 02/16/2020 03/05/2019 01/20/2018  Total Protein 6.1 - 8.1 g/dL 6.9 7.1 7.1  Albumin 3.5 - 5.0 g/dL - - -  AST 10 - 35 U/L 14 14 14   ALT 6 - 29 U/L 14 13 13   Alk Phosphatase 38 - 126 U/L - - -  Total Bilirubin 0.2 - 1.2 mg/dL 0.4 0.4 0.4     The ASCVD Risk score (Nuevo., et al., 2013) failed to calculate for the following reasons:   The valid total  cholesterol range is 130 to 320 mg/dL   Patient has failed these meds in past: NA Patient is currently controlled on the following medications:  . Aspirin 81 mg daily  . Atorvastatin 66m daily  We discussed: At goal Denies myalgias  Plan  Continue current medications   Diabetes   A1c goal <7%  Recent Relevant Labs: Lab Results  Component Value Date/Time   HGBA1C 6.5 (A) 08/03/2020 10:25 AM   HGBA1C 6.2 02/01/2020 11:30 AM   HGBA1C 6.2 07/17/2019 08:47 AM   MICROALBUR 100 08/03/2020 10:33 AM   MICROALBUR 0.8 07/17/2019 12:00 AM   MICROALBUR 20 01/15/2018 12:34 PM    Last diabetic Eye exam:  Lab Results  Component Value Date/Time   HMDIABEYEEXA No Retinopathy 04/03/2018 12:00 AM    Last diabetic Foot exam: No results found for: HMDIABFOOTEX   Checking BG: 1-2 times weekly  Recent FBG Readings: NA Recent pre-meal BG readings: NA Recent 2hr PP BG readings:  112 Recent HS BG readings:  NA  Patient has failed these meds in past: NA Patient is currently controlled on the following medications: . Jardiance 25 mg daily   We discussed: diet and exercise extensively. Patient states PAP for Jardiance not renewed for 2022. She is low on her medication and is worried about the cost of it.   Denies symptoms of hypotension.   Plan  Continue current medications  Will start PAP for Jardiance for 2022 and will see about getting patient samples.    Misc / OTC   . Vitamin D3 2000 units daily  . Multivitamin daily   We discussed:  DEXA June 2021 normal.   Plan  Continue current medications  Medication Management   [facility] Uses pill box? Yes Pt endorses 100% compliance   Plan  Continue current medication management strategy  Follow up: 3 month phone visit  [facility] [phone]

## 2020-11-03 NOTE — Chronic Care Management (AMB) (Signed)
    Chronic Care Management Pharmacy Assistant   Name: NEOMA UHRICH  MRN: [id number] DOB: Jan 24, 1956  Reason for Encounter: Patient Assistance Coordination  PCP : Alba Cory, MD  11/03/2020- Patient assistance form filled out for Jardiance 25 mg with [employer] Patient [employer] Program. Called patient, left message informing of patient assistance paperwork that is being placed in the mail for her to complete and to bring back to her PCP office for signatures.   Follow-Up:  Patient Assistance Coordination

## 2020-11-07 DIAGNOSIS — I10 Essential (primary) hypertension: Secondary | ICD-10-CM | POA: Diagnosis not present

## 2020-11-07 DIAGNOSIS — I255 Ischemic cardiomyopathy: Secondary | ICD-10-CM | POA: Diagnosis not present

## 2020-11-07 DIAGNOSIS — I2510 Atherosclerotic heart disease of native coronary artery without angina pectoris: Secondary | ICD-10-CM | POA: Diagnosis not present

## 2020-11-07 DIAGNOSIS — E782 Mixed hyperlipidemia: Secondary | ICD-10-CM | POA: Diagnosis not present

## 2020-11-15 ENCOUNTER — Ambulatory Visit: Payer: Self-pay | Admitting: *Deleted

## 2020-11-15 ENCOUNTER — Other Ambulatory Visit: Payer: Self-pay

## 2020-11-15 ENCOUNTER — Ambulatory Visit (INDEPENDENT_AMBULATORY_CARE_PROVIDER_SITE_OTHER): Payer: Medicare Other | Admitting: Family Medicine

## 2020-11-15 ENCOUNTER — Encounter: Payer: Self-pay | Admitting: Family Medicine

## 2020-11-15 VITALS — BP 132/80 | HR 89 | Temp 98.8°F | Resp 18 | Ht 62.0 in | Wt 251.8 lb

## 2020-11-15 DIAGNOSIS — L600 Ingrowing nail: Secondary | ICD-10-CM

## 2020-11-15 DIAGNOSIS — Z23 Encounter for immunization: Secondary | ICD-10-CM

## 2020-11-15 MED ORDER — AMOXICILLIN-POT CLAVULANATE 875-125 MG PO TABS: 1.0000 | ORAL_TABLET | Freq: Two times a day (BID) | ORAL | 0 refills | Status: DC

## 2020-11-15 NOTE — Telephone Encounter (Signed)
Patient is calling to report she has ingrown toenail- toe is red, swollen and has slight drainage- home treatment not clearing it- advised UC per protocol- no appointment available within disposition.  Reason for Disposition . [1] Skin around the nail has become red AND [2] larger than 2 inches (5 cm)  Answer Assessment - Initial Assessment Questions 1. LOCATION: "Which toe?"      R big toe 2. APPEARANCE: "What does it look like?"      Red and swelling, some drainage 3. ONSET: "When did it start?"      4 days 4. PAIN: "Is there any pain?" If Yes, ask: "How bad is the pain?"   (Scale 1-10; or mild, moderate, severe)     Yes- moderate 5. REDNESS: "Is there any redness of the skin?" If Yes, ask: "How much of the toe is red?"     Yes- top to joint 6. OTHER SYMPTOMS: "Do you have any other symptoms?" (e.g., fever, shaking, chills, red streak up foot)     Swelling to foot 7. PREGNANCY: "Is there any chance you are pregnant?" "When was your last menstrual period?"     n/a  Protocols used: TOENAIL - INGROWN-A-AH

## 2020-11-15 NOTE — Progress Notes (Signed)
Name: Kathy Price   MRN: [id number]    DOB: 05/06/1956   Date:11/15/2020       Progress Note  Subjective  Chief Complaint  Infected toe nail  HPI   Ingrown toenail: patient states she trimmed her nails a few days ago and noticed some pain and swelling of right first toe with some drainage noticed on medial aspect . She came in today for evaluation. She states pain controlled with otc medication and Epson salt soaks. She has DM but glucose has been at goal, this morning was 116. No fever or chills. Tender to touch.   Patient Active Problem List   Diagnosis Date Noted  . Helicobacter pylori gastritis 11/06/2016  . Cardiomyopathy, ischemic 01/30/2016  . CAD S/P percutaneous coronary angioplasty 09/07/2015  . Diabetes mellitus type 2 in obese (HCC) 09/07/2015  . Dyslipidemia 09/07/2015  . H/O: hysterectomy 09/07/2015  . H/O acute myocardial infarction 09/07/2015  . Vitamin D deficiency 09/07/2015  . Morbid obesity (HCC) 09/07/2015  . Bradycardia 05/13/2014  . Arteriosclerosis of coronary artery 05/13/2014  . Essential (primary) hypertension 05/13/2014  . UARS (upper airway resistance syndrome) 01/23/2013    Past Surgical History:  Procedure Laterality Date  . ABDOMINAL HYSTERECTOMY    . COLONOSCOPY    . COLONOSCOPY WITH PROPOFOL N/A 05/09/2020   Procedure: COLONOSCOPY WITH PROPOFOL;  Surgeon: Wyline Mood, MD;  Location: [facility] ENDOSCOPY;  Service: Gastroenterology;  Laterality: N/A;  . CORONARY ANGIOPLASTY  2002   stents  . ESOPHAGOGASTRODUODENOSCOPY (EGD) WITH PROPOFOL N/A 11/05/2016   Procedure: ESOPHAGOGASTRODUODENOSCOPY (EGD) WITH PROPOFOL;  Surgeon: Wyline Mood, MD;  Location: [facility] ENDOSCOPY;  Service: Endoscopy;  Laterality: N/A;    Family History  Problem Relation Age of Onset  . Diabetes Mother   . Hypertension Mother   . Cancer Father   . CAD Brother   . Breast cancer Sister 12    Social History   Tobacco Use  . Smoking status: Former Smoker    Packs/day: 1.00     Years: 25.00    Pack years: 25.00    Types: Cigarettes    Quit date: 09/06/2000    Years since quitting: 20.2  . Smokeless tobacco: Never Used  Substance Use Topics  . Alcohol use: No    Alcohol/week: 0.0 standard drinks     Current Outpatient Medications:  .  aspirin EC 81 MG tablet, Take 1 tablet by mouth daily., Disp: , Rfl:  .  atorvastatin (LIPITOR) 40 MG tablet, Take 1 tablet (40 mg total) by mouth daily., Disp: 90 tablet, Rfl: 1 .  Cholecalciferol (VITAMIN D3) 2000 UNITS capsule, Take 1 capsule by mouth daily., Disp: , Rfl:  .  empagliflozin (JARDIANCE) 25 MG TABS tablet, Take 25 mg by mouth daily., Disp: 90 tablet, Rfl: 3 .  Multiple Vitamins-Minerals (WOMENS MULTIVITAMIN PO), Take 1 tablet by mouth. , Disp: , Rfl:  .  sacubitril-valsartan (ENTRESTO) 24-26 MG, Take 1 tablet by mouth daily., Disp: , Rfl:  .  carvedilol (COREG) 3.125 MG tablet, Take 1 tablet by mouth 2 (two) times a day., Disp: , Rfl:  .  spironolactone (ALDACTONE) 25 MG tablet, Take by mouth., Disp: , Rfl:   Allergies  Allergen Reactions  . Metoprolol Other (See Comments)    severe bradycardia  . Pantoprazole Rash  . Sulfa Antibiotics Rash and Hives    I personally reviewed active problem list, medication list, allergies, family history, social history, health maintenance with the patient/caregiver today.   ROS  Ten systems  reviewed and is negative except as mentioned in HPI   Objective  Vitals:   11/15/20 1340  BP: 132/80  Pulse: 89  Resp: 18  Temp: 98.8 F (37.1 C)  TempSrc: Oral  SpO2: 99%  Weight: 251 lb 12.8 oz (114.2 kg)  Height: 5\' 2"  (1.575 m)    Body mass index is 46.05 kg/m.  Physical Exam  Constitutional: Patient appears well-developed and well-nourished. Obese  No distress.  HEENT: head atraumatic, normocephalic, pupils equal and reactive to light, neck supple Cardiovascular: Normal rate, regular rhythm and normal heart sounds.  No murmur heard. No BLE  edema. Pulmonary/Chest: Effort normal and breath sounds normal. No respiratory distress. Toe: ingrown toe nail, very tender and swollen to touch, first right toe, drainage noticed from medial aspect  Abdominal: Soft.  There is no tenderness. Psychiatric: Patient has a normal mood and affect. behavior is normal. Judgment and thought content normal.  PHQ2/9: Depression screen [facility] 2/9 11/15/2020 08/03/2020 04/05/2020 02/01/2020 07/17/2019  Decreased Interest 0 0 0 0 3  Down, Depressed, Hopeless 0 0 0 0 0  PHQ - 2 Score 0 0 0 0 3  Altered sleeping - 0 0 0 0  Tired, decreased energy - 0 0 0 0  Change in appetite - 0 0 0 0  Feeling bad or failure about yourself  - 0 0 0 0  Trouble concentrating - 0 0 0 0  Moving slowly or fidgety/restless - 0 0 0 0  Suicidal thoughts - 0 0 0 0  PHQ-9 Score - 0 0 0 3  Difficult doing work/chores - Not difficult at all - Not difficult at all Not difficult at all    phq 9 is negative  Fall Risk: Fall Risk  11/15/2020 08/03/2020 04/05/2020 02/01/2020 07/17/2019  Falls in the past year? 0 0 0 0 0  Number falls in past yr: 0 0 0 0 0  Injury with Fall? 0 0 0 0 0     Functional Status Survey: Is the patient deaf or have difficulty hearing?: No Does the patient have difficulty seeing, even when wearing glasses/contacts?: No Does the patient have difficulty concentrating, remembering, or making decisions?: No Does the patient have difficulty walking or climbing stairs?: No Does the patient have difficulty dressing or bathing?: No Does the patient have difficulty doing errands alone such as visiting a doctor's office or shopping?: No   Assessment & Plan  1. Ingrown toenail with infection  - amoxicillin-clavulanate (AUGMENTIN) 875-125 MG tablet; Take 1 tablet by mouth 2 (two) times daily.  Dispense: 14 tablet; Refill: 0 - Ambulatory referral to Podiatry  2. Need for Tdap vaccination  - Tdap vaccine greater than or equal to 7yo IM

## 2020-11-18 ENCOUNTER — Encounter: Payer: Self-pay | Admitting: Podiatry

## 2020-11-18 ENCOUNTER — Other Ambulatory Visit: Payer: Self-pay

## 2020-11-18 ENCOUNTER — Ambulatory Visit: Payer: Medicare Other | Admitting: Podiatry

## 2020-11-18 DIAGNOSIS — L600 Ingrowing nail: Secondary | ICD-10-CM | POA: Diagnosis not present

## 2020-11-18 MED ORDER — GENTAMICIN SULFATE 0.1 % EX CREA
1.0000 | TOPICAL_CREAM | Freq: Two times a day (BID) | CUTANEOUS | 1 refills | Status: DC
Start: 2020-11-18 — End: 2021-02-02

## 2020-11-18 NOTE — Patient Instructions (Signed)

## 2020-11-18 NOTE — Progress Notes (Signed)
   Subjective: Patient presents today for evaluation of pain to the medial aspect of the right great toe that has been going on for approximately 10 days now. Patient is concerned for possible ingrown nail. Patient presents today for further treatment and evaluation.  Past Medical History:  Diagnosis Date  . Back pain   . Breathing-related sleep disorder   . CAD (coronary artery disease)   . Diabetes mellitus without complication (HCC)   . History of MI (myocardial infarction)   . Hyperlipidemia   . Myocardial infarction (HCC)   . Obese   . Postablative ovarian failure   . Vitamin D deficiency     Objective:  General: Well developed, nourished, in no acute distress, alert and oriented x3   Dermatology: Skin is warm, dry and supple bilateral.  Medial aspect right great toe appears to be erythematous with evidence of an ingrowing nail. Pain on palpation noted to the border of the nail fold. The remaining nails appear unremarkable at this time. There are no open sores, lesions.  Vascular: Dorsalis Pedis artery and Posterior Tibial artery pedal pulses palpable. No lower extremity edema noted.   Neruologic: Grossly intact via light touch bilateral.  Musculoskeletal: Muscular strength within normal limits in all groups bilateral. Normal range of motion noted to all pedal and ankle joints.   Assesement: #1 Paronychia with ingrowing nail medial aspect right great toe #2 Pain in toe #3 Incurvated nail  Plan of Care:  1. Patient evaluated.  2. Discussed treatment alternatives and plan of care. Explained nail avulsion procedure and post procedure course to patient. 3. Patient opted for permanent partial nail avulsion of the right great toe medial aspect.  4. Prior to procedure, local anesthesia infiltration utilized using 3 ml of a 50:50 mixture of 2% plain lidocaine and 0.5% plain marcaine in a normal hallux block fashion and a betadine prep performed.  5. Partial permanent nail avulsion  with chemical matrixectomy performed using 3x30sec applications of phenol followed by alcohol flush.  6. Light dressing applied. 7.  Prescription for gentamicin cream applied daily  8.  Return to clinic 2 weeks.  Felecia Shelling, DPM [facility]  Dr. Felecia Shelling, DPM    [address] [place], [place] [address]                Office [phone]  Fax [phone]

## 2020-11-28 ENCOUNTER — Other Ambulatory Visit: Payer: Self-pay | Admitting: Family Medicine

## 2020-11-28 DIAGNOSIS — E1169 Type 2 diabetes mellitus with other specified complication: Secondary | ICD-10-CM

## 2020-12-01 ENCOUNTER — Ambulatory Visit (INDEPENDENT_AMBULATORY_CARE_PROVIDER_SITE_OTHER): Payer: Medicare Other

## 2020-12-01 DIAGNOSIS — E669 Obesity, unspecified: Secondary | ICD-10-CM

## 2020-12-01 DIAGNOSIS — E1169 Type 2 diabetes mellitus with other specified complication: Secondary | ICD-10-CM | POA: Diagnosis not present

## 2020-12-01 NOTE — Chronic Care Management (AMB) (Signed)
[employer] Patient Assistance form for Jardiance completed by patient and faxed for review on 12/01/20  Garey Ham Clinical Pharmacist [facility] [phone]

## 2020-12-02 ENCOUNTER — Ambulatory Visit: Payer: Medicare Other | Admitting: Podiatry

## 2020-12-02 ENCOUNTER — Encounter: Payer: Self-pay | Admitting: Podiatry

## 2020-12-02 ENCOUNTER — Other Ambulatory Visit: Payer: Self-pay

## 2020-12-02 DIAGNOSIS — L600 Ingrowing nail: Secondary | ICD-10-CM | POA: Diagnosis not present

## 2020-12-02 NOTE — Progress Notes (Signed)
   Subjective: 65 y.o. female presents today status post permanent nail avulsion procedure of the medial border right great toe that was performed on 11/18/2020.  Patient states that she is feeling much better.  The pain has improved significantly.  She has been soaking her foot and applying the antibiotic cream as instructed.  No new complaints at this time  Past Medical History:  Diagnosis Date  . Back pain   . Breathing-related sleep disorder   . CAD (coronary artery disease)   . Diabetes mellitus without complication (HCC)   . History of MI (myocardial infarction)   . Hyperlipidemia   . Myocardial infarction (HCC)   . Obese   . Postablative ovarian failure   . Vitamin D deficiency     Objective: Skin is warm, dry and supple. Nail and respective nail fold appears to be healing appropriately. Open wound to the associated nail fold with a granular wound base and moderate amount of fibrotic tissue. Minimal drainage noted. Mild erythema around the periungual region likely due to phenol chemical matricectomy.  Assessment: #1 postop permanent partial nail avulsion medial border right great toe #2 open wound periungual nail fold of respective digit.   Plan of care: #1 patient was evaluated  #2 debridement of open wound was performed to the periungual border of the respective toe using a currette. Antibiotic ointment and Band-Aid was applied. #3 patient is to return to clinic on a PRN basis.   Felecia Shelling, DPM [facility]  Dr. Felecia Shelling, DPM    [address] [place], [place] [address]                Office [phone]  Fax [phone]

## 2021-01-23 ENCOUNTER — Telehealth: Payer: Self-pay

## 2021-01-23 NOTE — Progress Notes (Signed)
Chronic Care Management Pharmacy Assistant   Name: AUBRIAUNA RINER  MRN: [id number] DOB: 22-Jul-1956  Reason for Encounter:Hypertension and Heart Failure  Disease State Call.   Recent office visits:  None ID   Recent consult visits:  12/02/2020 [facility]iat[facility] visits:  None in previous 6 months  Medications: Outpatient Encounter Medications as of 01/23/2021  Medication Sig  . amoxicillin-clavulanate (AUGMENTIN) 875-125 MG tablet Take 1 tablet by mouth 2 (two) times daily.  Marland Kitchen aspirin EC 81 MG tablet Take 1 tablet by mouth daily.  Marland Kitchen atorvastatin (LIPITOR) 40 MG tablet TAKE 1 TABLET BY MOUTH EVERY DAY  . carvedilol (COREG) 3.125 MG tablet Take 1 tablet by mouth 2 (two) times a day.  . Cholecalciferol (VITAMIN D3) 2000 UNITS capsule Take 1 capsule by mouth daily.  . empagliflozin (JARDIANCE) 25 MG TABS tablet Take 25 mg by mouth daily.  Marland Kitchen gentamicin cream (GARAMYCIN) 0.1 % Apply 1 application topically 2 (two) times daily.  . Multiple Vitamins-Minerals (WOMENS MULTIVITAMIN PO) Take 1 tablet by mouth.   . sacubitril-valsartan (ENTRESTO) 24-26 MG Take 1 tablet by mouth daily.  Marland Kitchen spironolactone (ALDACTONE) 25 MG tablet Take by mouth.   No facility-administered encounter medications on file as of 01/23/2021.    Star Rating Drugs:atorvastatin 40 mg ,sacubitril-Valsartan 24-26 mg   Reviewed chart prior to disease state call. Spoke with patient regarding BP  Recent Office Vitals: BP Readings from Last 3 Encounters:  11/15/20 132/80  08/03/20 132/82  05/09/20 135/83   Pulse Readings from Last 3 Encounters:  11/15/20 89  08/03/20 88  05/09/20 62    Wt Readings from Last 3 Encounters:  11/15/20 251 lb 12.8 oz (114.2 kg)  08/03/20 249 lb 8 oz (113.2 kg)  05/09/20 246 lb (111.6 kg)     Kidney Function Lab Results  Component Value Date/Time   CREATININE 0.93 02/16/2020 08:30 AM   CREATININE 0.92 03/05/2019 07:59 AM   GFRNONAA 64 02/16/2020 08:30 AM   GFRAA  75 02/16/2020 08:30 AM    BMP Latest Ref Rng & Units 02/16/2020 03/05/2019 01/20/2018  Glucose 65 - 99 mg/dL 010(X) 323(F) 573(U)  BUN 7 - 25 mg/dL 14 15 12   Creatinine 0.50 - 0.99 mg/dL 2.02 5.42)  BUN/Creat Ratio 6 - 22 (calc) NOT APPLICABLE NOT APPLICABLE 12  Sodium 135 - 146 mmol/L 143 139 141  Potassium 3.5 - 5.3 mmol/L 4.6 4.4 4.5  Chloride 98 - 110 mmol/L 108 106 107  CO2 20 - 32 mmol/L 27 27 28   Calcium 8.6 - 10.4 mg/dL 7.06(C 9.9    . Current antihypertensive regimen:  ? Carvedilol 3.125mg  twice daily ? Entresto 24-26 mg daily  ? Spironolactone 25mg  daily . How often are you checking your Blood Pressure? infrequently . Current home BP readings:  o Patent states her blood pressure ranges around 122/75 with the highest of 135/75. . What recent interventions/DTPs have been made by any provider to improve Blood Pressure control since last CPP Visit: None ID  . Any recent hospitalizations or ED visits since last visit with CPP? No . What diet changes have been made to improve Blood Pressure Control?  o Patient reports she stop fried food and no salt intake. . What exercise is being done to improve your Blood Pressure Control?  o Patient states she does not exercise.  Adherence Review: Is the patient currently on ACE/ARB medication? Yes Does the patient have >5 day gap between last estimated fill  dates? No    Everlean Cherry Clinical Lobbyist [phone]

## 2021-02-01 NOTE — Progress Notes (Signed)
Name: Kathy Price   MRN: [id number]    DOB: [birth year]   Date:02/02/2021       Progress Note  Subjective  Chief Complaint  Follow up  HPI   DMII: A1C has been at goal, today it is 6.1 % . She has not been checking her glucose lately. She denies polyphagia, polydipsia or polyuria.Urine micro today was elevated she is on ARB, she has changed her diet. She is currently only taking Jardiance and denies hypoglycemic episode. No polyphagia, polydipsia or polyuria   CAD: s/p MI and stent placement 08/2001, seesDr. Gwen Pounds , last visit 10/2020 andshehas ischemic cardiomyopathy with left wall hypokinesis. Stress test only showed old lesion, no acute ischemia She has been taking apirin 81 mg daily,Entresto, spironolactone , carvediloland atorvastatin.Last EKG at cardiologist showed frequent PVC's. She denies SOB ,diaphoresis, orthopnea or edema. Last LDL not at goal.   INTERPRETATION 08/2019  MOD LV SYSTOLIC DYSFUNCTION WITH AN ESTIMATED EF = 35-40 % NORMAL RIGHT VENTRICULAR SYSTOLIC FUNCTION MILD TRICUSPID AND MITRAL VALVE INSUFFICIENCY TRACE AORTIC VALVE INSUFFICIENCY NO VALVULAR STENOSIS MILD LV ENLARGEMENT MILD LA ENLARGEMENT  GERD:she had EGD done by Dr. Tobi Bastos back 10/2016 and was treated for h. Pylori gastritis. She is nolonger having indigestion or epigastric pain,she has heart burn very seldom usually triggered by diet  Morbid Obesity:she gained 20 lbs from  07/2018 until 2021, stopped working August 2019 and has not been as active, states also gained more since COVID. She lost 5 lbs since last visit, she states trying to cut down on sweets and fried food, she states she has been riding her bike once or twice a week, for about 20 minutes. Advised to increase to 30 minutes 5 days a week  Hyperlipidemia:she had labs done by cardiologist in 10/2020 and LDL was up to 108, last year it was 59. She states she was not taking Atorvastatin daily at that time, but has resumed  daily use since. Explained importance of compliance   Patient Active Problem List   Diagnosis Date Noted  . Helicobacter pylori gastritis 11/06/2016  . Cardiomyopathy, ischemic 01/30/2016  . CAD S/P percutaneous coronary angioplasty 09/07/2015  . Diabetes mellitus type 2 in obese (HCC) 09/07/2015  . Dyslipidemia 09/07/2015  . H/O: hysterectomy 09/07/2015  . H/O acute myocardial infarction 09/07/2015  . Vitamin D deficiency 09/07/2015  . Morbid obesity (HCC) 09/07/2015  . Bradycardia 05/13/2014  . Arteriosclerosis of coronary artery 05/13/2014  . Essential (primary) hypertension 05/13/2014  . UARS (upper airway resistance syndrome) 01/23/2013    Past Surgical History:  Procedure Laterality Date  . ABDOMINAL HYSTERECTOMY    . COLONOSCOPY    . COLONOSCOPY WITH PROPOFOL N/A 05/09/2020   Procedure: COLONOSCOPY WITH PROPOFOL;  Surgeon: Wyline Mood, MD;  Location: [facility] ENDOSCOPY;  Service: Gastroenterology;  Laterality: N/A;  . CORONARY ANGIOPLASTY  2002   stents  . ESOPHAGOGASTRODUODENOSCOPY (EGD) WITH PROPOFOL N/A 11/05/2016   Procedure: ESOPHAGOGASTRODUODENOSCOPY (EGD) WITH PROPOFOL;  Surgeon: Wyline Mood, MD;  Location: [facility] ENDOSCOPY;  Service: Endoscopy;  Laterality: N/A;    Family History  Problem Relation Age of Onset  . Diabetes Mother   . Hypertension Mother   . Cancer Father   . CAD Brother   . Breast cancer Sister 78    Social History   Tobacco Use  . Smoking status: Former Smoker    Packs/day: 1.00    Years: 25.00    Pack years: 25.00    Types: Cigarettes    Quit date: 09/06/2000  Years since quitting: 20.4  . Smokeless tobacco: Never Used  Substance Use Topics  . Alcohol use: No    Alcohol/week: 0.0 standard drinks     Current Outpatient Medications:  .  aspirin EC 81 MG tablet, Take 1 tablet by mouth daily., Disp: , Rfl:  .  atorvastatin (LIPITOR) 40 MG tablet, TAKE 1 TABLET BY MOUTH EVERY DAY, Disp: 90 tablet, Rfl: 0 .  Cholecalciferol (VITAMIN D3)  2000 UNITS capsule, Take 1 capsule by mouth daily., Disp: , Rfl:  .  empagliflozin (JARDIANCE) 25 MG TABS tablet, Take 25 mg by mouth daily., Disp: 90 tablet, Rfl: 3 .  Multiple Vitamins-Minerals (WOMENS MULTIVITAMIN PO), Take 1 tablet by mouth. , Disp: , Rfl:  .  sacubitril-valsartan (ENTRESTO) 24-26 MG, Take 1 tablet by mouth daily., Disp: , Rfl:  .  carvedilol (COREG) 3.125 MG tablet, Take 1 tablet by mouth 2 (two) times a day., Disp: , Rfl:  .  spironolactone (ALDACTONE) 25 MG tablet, Take by mouth., Disp: , Rfl:   Allergies  Allergen Reactions  . Metoprolol Other (See Comments)    severe bradycardia  . Pantoprazole Rash  . Sulfa Antibiotics Rash and Hives    I personally reviewed active problem list, medication list, allergies, family history, social history, health maintenance with the patient/caregiver today.   ROS  Constitutional: Negative for fever or weight change.  Respiratory: Negative for cough and shortness of breath.   Cardiovascular: Negative for chest pain or palpitations.  Gastrointestinal: Negative for abdominal pain, no bowel changes.  Musculoskeletal: Negative for gait problem or joint swelling.  Skin: Negative for rash.  Neurological: Negative for dizziness or headache.  No other specific complaints in a complete review of systems (except as listed in HPI above).   Objective  Vitals:   02/02/21 0940  BP: 136/82  Pulse: 84  Resp: 16  Temp: 98.4 F (36.9 C)  TempSrc: Oral  SpO2: 98%  Weight: 247 lb (112 kg)  Height: 5\' 2"  (1.575 m)    Body mass index is 45.18 kg/m.  Physical Exam  Constitutional: Patient appears well-developed and well-nourished. Obese  No distress.  HEENT: head atraumatic, normocephalic, pupils equal and reactive to light,  neck supple Cardiovascular: Normal rate, regular rhythm and normal heart sounds.  No murmur heard. No BLE edema. Pulmonary/Chest: Effort normal and breath sounds normal. No respiratory distress. Abdominal:  Soft.  There is no tenderness. Psychiatric: Patient has a normal mood and affect. behavior is normal. Judgment and thought content normal.  Recent Results (from the past 2160 hour(s))  POCT HgB A1C     Status: Abnormal   Collection Time: 02/02/21  9:55 AM  Result Value Ref Range   Hemoglobin A1C 6.1 (A) 4.0 - 5.6 %   HbA1c POC (<> result, manual entry)     HbA1c, POC (prediabetic range)     HbA1c, POC (controlled diabetic range)      Diabetic Foot Exam: Diabetic Foot Exam - Simple   Simple Foot Form Diabetic Foot exam was performed with the following findings: Yes 02/02/2021 10:23 AM  Visual Inspection No deformities, no ulcerations, no other skin breakdown bilaterally: Yes Sensation Testing Intact to touch and monofilament testing bilaterally: Yes Pulse Check Posterior Tibialis and Dorsalis pulse intact bilaterally: Yes Comments     PHQ2/9: Depression screen [facility] 2/9 02/02/2021 11/15/2020 08/03/2020 04/05/2020 02/01/2020  Decreased Interest 0 0 0 0 0  Down, Depressed, Hopeless 0 0 0 0 0  PHQ - 2 Score 0 0 0  0 0  Altered sleeping - - 0 0 0  Tired, decreased energy - - 0 0 0  Change in appetite - - 0 0 0  Feeling bad or failure about yourself  - - 0 0 0  Trouble concentrating - - 0 0 0  Moving slowly or fidgety/restless - - 0 0 0  Suicidal thoughts - - 0 0 0  PHQ-9 Score - - 0 0 0  Difficult doing work/chores - - Not difficult at all - Not difficult at all  Some recent data might be hidden    phq 9 is negative  Fall Risk: Fall Risk  02/02/2021 11/15/2020 08/03/2020 04/05/2020 02/01/2020  Falls in the past year? 0 0 0 0 0  Number falls in past yr: 0 0 0 0 0  Injury with Fall? 0 0 0 0 0    Functional Status Survey: Is the patient deaf or have difficulty hearing?: No Does the patient have difficulty seeing, even when wearing glasses/contacts?: No Does the patient have difficulty concentrating, remembering, or making decisions?: No Does the patient have difficulty walking or climbing  stairs?: No Does the patient have difficulty dressing or bathing?: No Does the patient have difficulty doing errands alone such as visiting a doctor's office or shopping?: No   Assessment & Plan  1. Diabetes mellitus type 2 in obese (HCC)  - POCT HgB A1C  2. Dyslipidemia associated with type 2 diabetes mellitus (HCC)   3. Morbid obesity (HCC)  Discussed with the patient the risk posed by an increased BMI. Discussed importance of portion control, calorie counting and at least 150 minutes of physical activity weekly. Avoid sweet beverages and drink more water. Eat at least 6 servings of fruit and vegetables daily   4. Essential (primary) hypertension   5. CAD S/P percutaneous coronary angioplasty   6. Gastroesophageal reflux disease without esophagitis   7. Cardiomyopathy, unspecified type (HCC)   8. Vitamin D deficiency   9. Hypertension associated with type 2 diabetes mellitus (HCC)

## 2021-02-02 ENCOUNTER — Encounter: Payer: Self-pay | Admitting: Family Medicine

## 2021-02-02 ENCOUNTER — Other Ambulatory Visit: Payer: Self-pay

## 2021-02-02 ENCOUNTER — Ambulatory Visit (INDEPENDENT_AMBULATORY_CARE_PROVIDER_SITE_OTHER): Payer: Medicare Other | Admitting: Family Medicine

## 2021-02-02 VITALS — BP 136/82 | HR 84 | Temp 98.4°F | Resp 16 | Ht 62.0 in | Wt 247.0 lb

## 2021-02-02 DIAGNOSIS — I429 Cardiomyopathy, unspecified: Secondary | ICD-10-CM

## 2021-02-02 DIAGNOSIS — I152 Hypertension secondary to endocrine disorders: Secondary | ICD-10-CM

## 2021-02-02 DIAGNOSIS — K219 Gastro-esophageal reflux disease without esophagitis: Secondary | ICD-10-CM

## 2021-02-02 DIAGNOSIS — E785 Hyperlipidemia, unspecified: Secondary | ICD-10-CM

## 2021-02-02 DIAGNOSIS — E1169 Type 2 diabetes mellitus with other specified complication: Secondary | ICD-10-CM

## 2021-02-02 DIAGNOSIS — E669 Obesity, unspecified: Secondary | ICD-10-CM | POA: Diagnosis not present

## 2021-02-02 DIAGNOSIS — E1159 Type 2 diabetes mellitus with other circulatory complications: Secondary | ICD-10-CM | POA: Diagnosis not present

## 2021-02-02 DIAGNOSIS — Z9861 Coronary angioplasty status: Secondary | ICD-10-CM

## 2021-02-02 DIAGNOSIS — I2510 Atherosclerotic heart disease of native coronary artery without angina pectoris: Secondary | ICD-10-CM | POA: Diagnosis not present

## 2021-02-02 DIAGNOSIS — E559 Vitamin D deficiency, unspecified: Secondary | ICD-10-CM

## 2021-02-02 DIAGNOSIS — I10 Essential (primary) hypertension: Secondary | ICD-10-CM | POA: Diagnosis not present

## 2021-02-02 LAB — POCT GLYCOSYLATED HEMOGLOBIN (HGB A1C): Hemoglobin A1C: 6.1 % — AB (ref 4.0–5.6)

## 2021-03-02 ENCOUNTER — Other Ambulatory Visit: Payer: Self-pay | Admitting: Family Medicine

## 2021-03-02 DIAGNOSIS — E785 Hyperlipidemia, unspecified: Secondary | ICD-10-CM

## 2021-03-02 DIAGNOSIS — E1169 Type 2 diabetes mellitus with other specified complication: Secondary | ICD-10-CM

## 2021-03-02 NOTE — Telephone Encounter (Signed)
Requested Prescriptions  Pending Prescriptions Disp Refills  . atorvastatin (LIPITOR) 40 MG tablet [Pharmacy Med Name: ATORVASTATIN 40 MG TABLET] 90 tablet 1    Sig: TAKE 1 TABLET BY MOUTH EVERY DAY     Cardiovascular:  Antilipid - Statins Failed - 03/02/2021  2:02 AM      Failed - Total Cholesterol in normal range and within 360 days    Cholesterol  Date Value Ref Range Status  02/16/2020 124 <200 mg/dL Final         Failed - LDL in normal range and within 360 days    LDL Cholesterol (Calc)  Date Value Ref Range Status  02/16/2020 59 mg/dL (calc) Final    Comment:    Reference range: <100 . Desirable range <100 mg/dL for primary prevention;   <70 mg/dL for patients with CHD or diabetic patients  with > or = 2 CHD risk factors. Marland Kitchen LDL-C is now calculated using the Martin-Hopkins  calculation, which is a validated novel method providing  better accuracy than the Friedewald equation in the  estimation of LDL-C.  Horald Pollen et al. Lenox Ahr. 8101;751(02): 2061-2068  (http://education.[facility].com/faq/FAQ164)          Failed - HDL in normal range and within 360 days    HDL  Date Value Ref Range Status  02/16/2020 46 (L) > OR = 50 mg/dL Final         Failed - Triglycerides in normal range and within 360 days    Triglycerides  Date Value Ref Range Status  02/16/2020 100 <150 mg/dL Final         Passed - Patient is not pregnant      Passed - Valid encounter within last 12 months    Recent Outpatient Visits          4 weeks ago Diabetes mellitus type 2 in obese [facility])   [facility] Alba Cory, MD   3 months ago Ingrown toenail with infection   [facility], Danna Hefty, MD   7 months ago Diabetes mellitus type 2 in obese [facility])   [facility] Alba Cory, MD   11 months ago [employer] preventive visit   [facility], Danna Hefty, MD   1 year ago Diabetes mellitus type 2  in obese [facility])   [facility] Alba Cory, MD      Future Appointments            In 5 months Carlynn Purl, Danna Hefty, MD [facility]   In 5 months Alba Cory, MD [facility], [facility]

## 2021-03-22 ENCOUNTER — Telehealth: Payer: Self-pay

## 2021-03-22 NOTE — Progress Notes (Signed)
Chronic Care Management Pharmacy Assistant   Name: Kathy Price  MRN: [id number] DOB: 05-23-1956  Reason for Encounter:Diabetes Disease State  Call.   Recent office visits:  02/02/2021  Dr. Carlynn Purl MD (PCP)  Recent consult visits:  No recent Consult  Hospital visits:  None in previous 6 months  Medications: Outpatient Encounter Medications as of 03/22/2021  Medication Sig  . aspirin EC 81 MG tablet Take 1 tablet by mouth daily.  Marland Kitchen atorvastatin (LIPITOR) 40 MG tablet TAKE 1 TABLET BY MOUTH EVERY DAY  . carvedilol (COREG) 3.125 MG tablet Take 1 tablet by mouth 2 (two) times a day.  . Cholecalciferol (VITAMIN D3) 2000 UNITS capsule Take 1 capsule by mouth daily.  . empagliflozin (JARDIANCE) 25 MG TABS tablet Take 25 mg by mouth daily.  . Multiple Vitamins-Minerals (WOMENS MULTIVITAMIN PO) Take 1 tablet by mouth.   . sacubitril-valsartan (ENTRESTO) 24-26 MG Take 1 tablet by mouth daily.  Marland Kitchen spironolactone (ALDACTONE) 25 MG tablet Take by mouth.   No facility-administered encounter medications on file as of 03/22/2021.    Star Rating Drugs: Atorvastatin 40 mg last filled on 11/28/2020 for [facility] day supply [facility]. Jardiance 25 mg - Patient assistance program Sacubitril-Valsartan 24-26 mg last filled on 11/08/2020 for [facility] day supply at [facility].  Recent Relevant Labs: Lab Results  Component Value Date/Time   HGBA1C 6.1 (A) 02/02/2021 09:55 AM   HGBA1C 6.5 (A) 08/03/2020 10:25 AM   HGBA1C 6.2 02/01/2020 11:30 AM   HGBA1C 6.2 07/17/2019 08:47 AM   MICROALBUR 100 08/03/2020 10:33 AM   MICROALBUR 0.8 07/17/2019 12:00 AM   MICROALBUR 20 01/15/2018 12:34 PM    Kidney Function Lab Results  Component Value Date/Time   CREATININE 0.93 02/16/2020 08:30 AM   CREATININE 0.92 03/05/2019 07:59 AM   GFRNONAA 64 02/16/2020 08:30 AM   GFRAA 75 02/16/2020 08:30 AM    . Current antihyperglycemic regimen:  ? Jardiance 25mg  daily . What recent interventions/DTPs have been  made to improve glycemic control:  o None ID . Have there been any recent hospitalizations or ED visits since last visit with CPP? No . Patient denies hypoglycemic symptoms, including Pale, Sweaty, Shaky, Hungry, Nervous/irritable and Vision changes . Patient denies hyperglycemic symptoms, including blurry vision, excessive thirst, fatigue, polyuria and weakness . How often are you checking your blood sugar? Every Other day. . What are your blood sugars ranging?  o Before meals:112 . During the week, how often does your blood glucose drop below 70? Never . Are you checking your feet daily/regularly?   Patient Denies pain, numbness or tingling sensation in her feet.  Adherence Review: Is the patient currently on a STATIN medication? Yes Is the patient currently on ACE/ARB medication? Yes Does the patient have >5 day gap between last estimated fill dates? Yes  Patient states she only has 3 tablet left of her Jardiance.Patient states she has called her PCP to ask for a refill and still has not be notified of anything.Patient states she receives Patient assistance from a company for [place] but they are requesting a refill. I reached out to [facility] to confirm if patient is approved, and if they could refill her Jardiance due to her only having 3 tablets on hand. Per [facility], Patient was approve for Jardiance on 11/2020 and will end on 09/2021 patient will need to reenroll prior to 09/2021. Per [facility], Patient needs a prescriptions before they can fill Jardiance, either fax the prescription over to  [phone] or escript it.Notifed Clinical Pharmacist.  Per [facility] after they receive the prescription it can take up to one week before the patient receives Jardiance.   Everlean Cherry Clinical Pharmacist Assistant [phone]

## 2021-03-23 ENCOUNTER — Other Ambulatory Visit: Payer: Self-pay | Admitting: Family Medicine

## 2021-03-23 ENCOUNTER — Telehealth: Payer: Self-pay | Admitting: Family Medicine

## 2021-03-23 ENCOUNTER — Other Ambulatory Visit: Payer: Self-pay

## 2021-03-23 DIAGNOSIS — E1169 Type 2 diabetes mellitus with other specified complication: Secondary | ICD-10-CM

## 2021-03-23 DIAGNOSIS — E669 Obesity, unspecified: Secondary | ICD-10-CM

## 2021-03-23 MED ORDER — EMPAGLIFLOZIN 25 MG PO TABS: 25.0000 mg | ORAL_TABLET | Freq: Every day | ORAL | 3 refills | Status: DC

## 2021-03-23 NOTE — Telephone Encounter (Signed)
Pt is requesting a refill of her jardiance, wants to speak to the pharmacist. She uses a program that covers the cost for a 90 day supply and it gets sent to her via mail.   Best contact: [phone]

## 2021-04-06 ENCOUNTER — Other Ambulatory Visit: Payer: Self-pay

## 2021-04-06 ENCOUNTER — Ambulatory Visit (INDEPENDENT_AMBULATORY_CARE_PROVIDER_SITE_OTHER): Payer: Medicare Other

## 2021-04-06 VITALS — BP 120/80 | HR 84 | Temp 98.1°F | Resp 16 | Ht 62.0 in | Wt 244.1 lb

## 2021-04-06 DIAGNOSIS — Z0000 Encounter for general adult medical examination without abnormal findings: Secondary | ICD-10-CM

## 2021-04-06 DIAGNOSIS — Z1231 Encounter for screening mammogram for malignant neoplasm of breast: Secondary | ICD-10-CM

## 2021-04-06 NOTE — Patient Instructions (Signed)
Kathy Price , Thank you for taking time to come for your Medicare Wellness Visit. I appreciate your ongoing commitment to your health goals. Please review the following plan we discussed and let me know if I can assist you in the future.   Screening recommendations/referrals: Colonoscopy: done 05/09/20. Repeat in 2026 Mammogram: done 04/12/20. Please call [phone] to schedule your mammogram.  Bone Density: done 04/12/20 Recommended yearly ophthalmology/optometry visit for glaucoma screening and checkup Recommended yearly dental visit for hygiene and checkup  Vaccinations: Influenza vaccine: done 08/03/20 Pneumococcal vaccine: done 04/05/20 Tdap vaccine: done 11/15/20 Shingles vaccine: Shingrix discussed. Please contact your pharmacy for coverage information.  Covid-19: done 12/26/19 & 01/23/20  Advanced directives: Advance directive discussed with you today. I have provided a copy for you to complete at home and have notarized. Once this is complete please bring a copy in to our office so we can scan it into your chart.   Conditions/risks identified: Recommend healthy eating and physical activity for desired weight loss  Next appointment: Follow up in one year for your annual wellness visit    Preventive Care 65 Years and Older, Female Preventive care refers to lifestyle choices and visits with your health care provider that can promote health and wellness. What does preventive care include? A yearly physical exam. This is also called an annual well check. Dental exams once or twice a year. Routine eye exams. Ask your health care provider how often you should have your eyes checked. Personal lifestyle choices, including: Daily care of your teeth and gums. Regular physical activity. Eating a healthy diet. Avoiding tobacco and drug use. Limiting alcohol use. Practicing safe sex. Taking low-dose aspirin every day. Taking vitamin and mineral supplements as recommended by your health care  provider. What happens during an annual well check? The services and screenings done by your health care provider during your annual well check will depend on your age, overall health, lifestyle risk factors, and family history of disease. Counseling  Your health care provider may ask you questions about your: Alcohol use. Tobacco use. Drug use. Emotional well-being. Home and relationship well-being. Sexual activity. Eating habits. History of falls. Memory and ability to understand (cognition). Work and work Astronomer. Reproductive health. Screening  You may have the following tests or measurements: Height, weight, and BMI. Blood pressure. Lipid and cholesterol levels. These may be checked every 5 years, or more frequently if you are over 51 years old. Skin check. Lung cancer screening. You may have this screening every year starting at age 19 if you have a 30-pack-year history of smoking and currently smoke or have quit within the past 15 years. Fecal occult blood test (FOBT) of the stool. You may have this test every year starting at age 7. Flexible sigmoidoscopy or colonoscopy. You may have a sigmoidoscopy every 5 years or a colonoscopy every 10 years starting at age 48. Hepatitis C blood test. Hepatitis B blood test. Sexually transmitted disease (STD) testing. Diabetes screening. This is done by checking your blood sugar (glucose) after you have not eaten for a while (fasting). You may have this done every 1-3 years. Bone density scan. This is done to screen for osteoporosis. You may have this done starting at age 72. Mammogram. This may be done every 1-2 years. Talk to your health care provider about how often you should have regular mammograms. Talk with your health care provider about your test results, treatment options, and if necessary, the need for more tests. Vaccines  Your  health care provider may recommend certain vaccines, such as: Influenza vaccine. This is  recommended every year. Tetanus, diphtheria, and acellular pertussis (Tdap, Td) vaccine. You may need a Td booster every 10 years. Zoster vaccine. You may need this after age 56. Pneumococcal 13-valent conjugate (PCV13) vaccine. One dose is recommended after age 93. Pneumococcal polysaccharide (PPSV23) vaccine. One dose is recommended after age 58. Talk to your health care provider about which screenings and vaccines you need and how often you need them. This information is not intended to replace advice given to you by your health care provider. Make sure you discuss any questions you have with your health care provider. Document Released: 11/11/2015 Document Revised: 07/04/2016 Document Reviewed: 08/16/2015 Elsevier Interactive Patient Education  2017 [place] Prevention in the Home Falls can cause injuries. They can happen to people of all ages. There are many things you can do to make your home safe and to help prevent falls. What can I do on the outside of my home? Regularly fix the edges of walkways and driveways and fix any cracks. Remove anything that might make you trip as you walk through a door, such as a raised step or threshold. Trim any bushes or trees on the path to your home. Use bright outdoor lighting. Clear any walking paths of anything that might make someone trip, such as rocks or tools. Regularly check to see if handrails are loose or broken. Make sure that both sides of any steps have handrails. Any raised decks and porches should have guardrails on the edges. Have any leaves, snow, or ice cleared regularly. Use sand or salt on walking paths during winter. Clean up any spills in your garage right away. This includes oil or grease spills. What can I do in the bathroom? Use night lights. Install grab bars by the toilet and in the tub and shower. Do not use towel bars as grab bars. Use non-skid mats or decals in the tub or shower. If you need to sit down in  the shower, use a plastic, non-slip stool. Keep the floor dry. Clean up any water that spills on the floor as soon as it happens. Remove soap buildup in the tub or shower regularly. Attach bath mats securely with double-sided non-slip rug tape. Do not have throw rugs and other things on the floor that can make you trip. What can I do in the bedroom? Use night lights. Make sure that you have a light by your bed that is easy to reach. Do not use any sheets or blankets that are too big for your bed. They should not hang down onto the floor. Have a firm chair that has side arms. You can use this for support while you get dressed. Do not have throw rugs and other things on the floor that can make you trip. What can I do in the kitchen? Clean up any spills right away. Avoid walking on wet floors. Keep items that you use a lot in easy-to-reach places. If you need to reach something above you, use a strong step stool that has a grab bar. Keep electrical cords out of the way. Do not use floor polish or wax that makes floors slippery. If you must use wax, use non-skid floor wax. Do not have throw rugs and other things on the floor that can make you trip. What can I do with my stairs? Do not leave any items on the stairs. Make sure that there are handrails  on both sides of the stairs and use them. Fix handrails that are broken or loose. Make sure that handrails are as long as the stairways. Check any carpeting to make sure that it is firmly attached to the stairs. Fix any carpet that is loose or worn. Avoid having throw rugs at the top or bottom of the stairs. If you do have throw rugs, attach them to the floor with carpet tape. Make sure that you have a light switch at the top of the stairs and the bottom of the stairs. If you do not have them, ask someone to add them for you. What else can I do to help prevent falls? Wear shoes that: Do not have high heels. Have rubber bottoms. Are comfortable  and fit you well. Are closed at the toe. Do not wear sandals. If you use a stepladder: Make sure that it is fully opened. Do not climb a closed stepladder. Make sure that both sides of the stepladder are locked into place. Ask someone to hold it for you, if possible. Clearly mark and make sure that you can see: Any grab bars or handrails. First and last steps. Where the edge of each step is. Use tools that help you move around (mobility aids) if they are needed. These include: Canes. Walkers. Scooters. Crutches. Turn on the lights when you go into a dark area. Replace any light bulbs as soon as they burn out. Set up your furniture so you have a clear path. Avoid moving your furniture around. If any of your floors are uneven, fix them. If there are any pets around you, be aware of where they are. Review your medicines with your doctor. Some medicines can make you feel dizzy. This can increase your chance of falling. Ask your doctor what other things that you can do to help prevent falls. This information is not intended to replace advice given to you by your health care provider. Make sure you discuss any questions you have with your health care provider. Document Released: 08/11/2009 Document Revised: 03/22/2016 Document Reviewed: 11/19/2014 Elsevier Interactive Patient Education  2017 [employer].

## 2021-04-06 NOTE — Progress Notes (Signed)
Subjective:   Kathy Price Price is a 65 y.o. female who presents for an Initial Medicare Annual Wellness Visit.  Review of Systems     Cardiac Risk Factors include: advanced age (>42men, >71 women);diabetes mellitus;dyslipidemia;hypertension;obesity (BMI >30kg/m2);smoking/ tobacco exposure     Objective:    Today's Vitals   04/06/21 0828  BP: 120/80  Pulse: 84  Resp: 16  Temp: 98.1 F (36.7 C)  TempSrc: Oral  SpO2: 98%  Weight: 244 lb 1.6 oz (110.7 kg)  Height: 5\' 2"  (1.575 m)   Body mass index is 44.65 kg/m.  Advanced Directives 04/06/2021 05/09/2020 06/28/2017 01/22/2017 11/05/2016 08/28/2016 06/27/2016  Does Patient Have a Medical Advance Directive? No No No No No No No  Would patient like information on creating a medical advance directive? Yes (MAU/Ambulatory/Procedural Areas - Information given) No - Patient declined - - - - No - patient declined information    Current Medications (verified) Outpatient Encounter Medications as of 04/06/2021  Medication Sig   aspirin EC 81 MG tablet Take 1 tablet by mouth daily.   atorvastatin (LIPITOR) 40 MG tablet TAKE 1 TABLET BY MOUTH EVERY DAY   Cholecalciferol (VITAMIN D3) 2000 UNITS capsule Take 1 capsule by mouth daily.   empagliflozin (JARDIANCE) 25 MG TABS tablet Take 1 tablet (25 mg total) by mouth daily.   Multiple Vitamins-Minerals (WOMENS MULTIVITAMIN PO) Take 1 tablet by mouth.    sacubitril-valsartan (ENTRESTO) 24-26 MG Take 1 tablet by mouth daily.   carvedilol (COREG) 3.125 MG tablet Take 1 tablet by mouth 2 (two) times a day.   spironolactone (ALDACTONE) 25 MG tablet Take by mouth.   No facility-administered encounter medications on file as of 04/06/2021.    Allergies (verified) Metoprolol, Pantoprazole, and Sulfa antibiotics   History: Past Medical History:  Diagnosis Date   Back pain    Breathing-related sleep disorder    CAD (coronary artery disease)    Diabetes mellitus without complication (HCC)    History of  MI (myocardial infarction)    Hyperlipidemia    Myocardial infarction (HCC)    Obese    Postablative ovarian failure    Vitamin D deficiency    Past Surgical History:  Procedure Laterality Date   ABDOMINAL HYSTERECTOMY     COLONOSCOPY     COLONOSCOPY WITH PROPOFOL N/A 05/09/2020   Procedure: COLONOSCOPY WITH PROPOFOL;  Surgeon: 07/10/2020, MD;  Location: [facility] ENDOSCOPY;  Service: Gastroenterology;  Laterality: N/A;   CORONARY ANGIOPLASTY  2002   stents   ESOPHAGOGASTRODUODENOSCOPY (EGD) WITH PROPOFOL N/A 11/05/2016   Procedure: ESOPHAGOGASTRODUODENOSCOPY (EGD) WITH PROPOFOL;  Surgeon: 01/03/2017, MD;  Location: [facility] ENDOSCOPY;  Service: Endoscopy;  Laterality: N/A;   Family History  Problem Relation Age of Onset   Diabetes Mother    Hypertension Mother    Cancer Father    CAD Brother    Breast cancer Sister 75   Social History   Socioeconomic History   Marital status: Married    Spouse name: 44   Number of children: 3   Years of education: Not on file   Highest education level: GED or equivalent  Occupational History   Occupation: retired  Tobacco Use   Smoking status: Former    Packs/day: 1.00    Years: 25.00    Pack years: 25.00    Types: Cigarettes    Quit date: 09/06/2000    Years since quitting: 20.5    Passive exposure: Current (husband smokes inside)   Smokeless tobacco: Never  Tobacco comments:    Smoking cessation materials not required  Vaping Use   Vaping Use: Never used  Substance and Sexual Activity   Alcohol use: No    Alcohol/week: 0.0 standard drinks   Drug use: No   Sexual activity: Yes    Partners: Male  Other Topics Concern   Not on file  Social History Narrative   Not on file   Social Determinants of Health   Financial Resource Strain: Low Risk    Difficulty of Paying Living Expenses: Not very hard  Food Insecurity: No Food Insecurity   Worried About Running Out of Food in the Last Year: Never true   Ran Out of Food in the Last  Year: Never true  Transportation Needs: No Transportation Needs   Lack of Transportation (Medical): No   Lack of Transportation (Non-Medical): No  Physical Activity: Insufficiently Active   Days of Exercise per Week: 2 days   Minutes of Exercise per Session: 30 min  Stress: No Stress Concern Present   Feeling of Stress : Not at all  Social Connections: Socially Integrated   Frequency of Communication with Friends and Family: More than three times a week   Frequency of Social Gatherings with Friends and Family: More than three times a week   Attends Religious Services: More than 4 times per year   Active Member of [employer] or Organizations: Yes   Attends Engineer, structural: More than 4 times per year   Marital Status: Married    Tobacco Counseling Counseling given: No Tobacco comments: Smoking cessation materials not required   Clinical Intake:  Pre-visit preparation completed: Yes        BMI - recorded: 44.65 Nutritional Status: BMI > 30  Obese Nutritional Risks: None Diabetes: Yes CBG done?: No Did pt. bring in CBG monitor from home?: No  How often do you need to have someone help you when you read instructions, pamphlets, or other written materials from your doctor or pharmacy?: 1 - Never  Nutrition Risk Assessment:  Has the patient had any N/V/D within the last 2 months?  No  Does the patient have any non-healing wounds?  No  Has the patient had any unintentional weight loss or weight gain?  No   Diabetes:  Is the patient diabetic?  Yes  If diabetic, was a CBG obtained today?  No  Did the patient bring in their glucometer from home?  No  How often do you monitor your CBG's? Occasionally per patient.   Financial Strains and Diabetes Management:  Are you having any financial strains with the device, your supplies or your medication? No .  Does the patient want to be seen by Chronic Care Management for management of their diabetes?  No  Would the  patient like to be referred to a Nutritionist or for Diabetic Management?  No   Diabetic Exams:  Diabetic Eye Exam: Completed 2019. Overdue for diabetic eye exam. Pt plans to contact insurance for in network provider.   Diabetic Foot Exam: Completed 02/02/21.    Interpreter Needed?: No  Information entered by :: Reather Littler LPN   Activities of Daily Living In your present state of health, do you have any difficulty performing the following activities: 04/06/2021 02/02/2021  Hearing? N N  Comment declines hearing aids -  Vision? N N  Difficulty concentrating or making decisions? N N  Walking or climbing stairs? N N  Dressing or bathing? N N  Doing errands, shopping? N N  Preparing Food and eating ? N -  Using the Toilet? N -  In the past six months, have you accidently leaked urine? N -  Do you have problems with loss of bowel control? N -  Managing your Medications? N -  Managing your Finances? N -  Housekeeping or managing your Housekeeping? N -  Some recent data might be hidden    Patient Care Team: Alba CorySowles, Krichna, MD as PCP - General (Family Medicine) Gaspar ColaFleury, Alexandre A, [facility]PH (Pharmacist) Lamar BlinksKowalski, Bruce J, MD as Consulting Physician (Cardiology)  Indicate any recent Medical Services you may have received from other than [facility] providers in the past year (date may be approximate).     Assessment:   This is a routine wellness examination for Selinda.  Hearing/Vision screen Hearing Screening - Comments:: Pt denies hearing difficulty  Vision Screening - Comments:: Past due for eye exam; pt plans to contact ins for network provider  Dietary issues and exercise activities discussed: Current Exercise Habits: Home exercise routine, Type of exercise: Other - see comments (exercise bike), Time (Minutes): 30, Frequency (Times/Week): 2, Weekly Exercise (Minutes/Week): 60, Intensity: Mild, Exercise limited by: None identified   Goals Addressed             This Visit's  Progress    Weight (lb) < 225 lb (102.1 kg)   244 lb 1.6 oz (110.7 kg)    Pt states she would like to lose weight over the next year with healthy eating and physical activity         Depression Screen PHQ 2/9 Scores 04/06/2021 02/02/2021 11/15/2020 08/03/2020 04/05/2020 02/01/2020 07/17/2019  PHQ - 2 Score 0 0 0 0 0 0 3  PHQ- 9 Score - - - 0 0 0 3    Fall Risk Fall Risk  04/06/2021 02/02/2021 11/15/2020 08/03/2020 04/05/2020  Falls in the past year? 0 0 0 0 0  Number falls in past yr: 0 0 0 0 0  Injury with Fall? 0 0 0 0 0  Risk for fall due to : No Fall Risks - - - -  Follow up Falls prevention discussed - - - -    FALL RISK PREVENTION PERTAINING TO THE HOME:  Any stairs in or around the home? Yes  If so, are there any without handrails? No  Home free of loose throw rugs in walkways, pet beds, electrical cords, etc? Yes  Adequate lighting in your home to reduce risk of falls? Yes   ASSISTIVE DEVICES UTILIZED TO PREVENT FALLS:  Life alert? No  Use of a cane, walker or w/c? No  Grab bars in the bathroom? No  Shower chair or bench in shower? No  Elevated toilet seat or a handicapped toilet? No   TIMED UP AND GO:  Was the test performed? Yes .  Length of time to ambulate 10 feet: 5 sec.   Gait steady and fast without use of assistive device  Cognitive Function:        Immunizations Immunization History  Administered Date(s) Administered   Fluad Quad(high Dose 65+) 08/03/2020   Influenza Inj Mdck Quad Pf 07/17/2019   Influenza, Seasonal, Injecte, Preservative Fre 07/29/2012, 06/29/2014   Influenza,inj,Quad PF,6+ Mos 09/29/2013, 06/28/2017, 08/28/2018   Influenza-Unspecified 06/29/2014, 08/25/2015, 08/16/2016   Moderna Sars-Covid-2 Vaccination 12/26/2019, 01/23/2020   Pneumococcal Conjugate-13 04/07/2010, 04/05/2020   Pneumococcal Polysaccharide-23 04/07/2010, 12/30/2014, 09/07/2015   Td 04/07/2010   Tdap 04/07/2010, 11/15/2020   Zoster, Live 09/25/2016    TDAP status: Up to  date  Flu Vaccine status: Up to date  Pneumococcal vaccine status: Up to date  Covid-19 vaccine status: Completed vaccines  Qualifies for Shingles Vaccine? Yes   Zostavax completed Yes   Shingrix Completed?: No.    Education has been provided regarding the importance of this vaccine. Patient has been advised to call insurance company to determine out of pocket expense if they have not yet received this vaccine. Advised may also receive vaccine at local pharmacy or Health Dept. Verbalized acceptance and understanding.  Screening Tests Health Maintenance  Topic Date Due   Zoster Vaccines- Shingrix (1 of 2) Never done   OPHTHALMOLOGY EXAM  04/04/2019   COVID-19 Vaccine (3 - Booster for Moderna series) 06/24/2020   PNA vac Low Risk Adult (2 of 2 - PPSV23) 04/05/2021   INFLUENZA VACCINE  05/29/2021   HEMOGLOBIN A1C  08/04/2021   FOOT EXAM  02/02/2022   MAMMOGRAM  04/12/2022   COLONOSCOPY (Pts 45-67yrs Insurance coverage will need to be confirmed)  05/09/2025   TETANUS/TDAP  11/15/2030   DEXA SCAN  Completed   Hepatitis C Screening  Completed   Pneumococcal Vaccine 67-39 Years old  Aged Out   HPV VACCINES  Aged Out    Health Maintenance  Health Maintenance Due  Topic Date Due   Zoster Vaccines- Shingrix (1 of 2) Never done   OPHTHALMOLOGY EXAM  04/04/2019   COVID-19 Vaccine (3 - Booster for Moderna series) 06/24/2020   PNA vac Low Risk Adult (2 of 2 - PPSV23) 04/05/2021    Colorectal cancer screening: Type of screening: Colonoscopy. Completed 05/09/20. Repeat every 5 years  Mammogram status: Completed 04/12/20. Repeat every year  Bone Density status: Completed 04/12/20. Results reflect: Bone density results: NORMAL. Repeat every 2 years.  Lung Cancer Screening: (Low Dose CT Chest recommended if Age 20-80 years, 30 pack-year currently smoking OR have quit w/in 15years.) does not qualify.  Additional Screening:  Hepatitis C Screening: does qualify; Completed 07/28/13  Vision  Screening: Recommended annual ophthalmology exams for early detection of glaucoma and other disorders of the eye. Is the patient up to date with their annual eye exam?  No  Who is the provider or what is the name of the office in which the patient attends annual eye exams? Not established If pt is not established with a provider, would they like to be referred to a provider to establish care? No .   Dental Screening: Recommended annual dental exams for proper oral hygiene  Community Resource Referral / Chronic Care Management: CRR required this visit?  No   CCM required this visit?  No      Plan:     I have personally reviewed and noted the following in the patient's chart:   Medical and social history Use of alcohol, tobacco or illicit drugs  Current medications and supplements including opioid prescriptions. Patient is not currently taking opioid prescriptions. Functional ability and status Nutritional status Physical activity Advanced directives List of other physicians Hospitalizations, surgeries, and ER visits in previous 12 months Vitals Screenings to include cognitive, depression, and falls Referrals and appointments  In addition, I have reviewed and discussed with patient certain preventive protocols, quality metrics, and best practice recommendations. A written personalized care plan for preventive services as well as general preventive health recommendations were provided to patient.     Reather Littler, LPN   12/06/3149   Nurse Notes: none

## 2021-04-14 ENCOUNTER — Ambulatory Visit
Admission: RE | Admit: 2021-04-14 | Discharge: 2021-04-14 | Disposition: A | Discharge status: Home / Self Care | Payer: Medicare Other | Source: Ambulatory Visit | Attending: Family Medicine | Admitting: Family Medicine

## 2021-04-14 ENCOUNTER — Other Ambulatory Visit: Payer: Self-pay

## 2021-04-14 DIAGNOSIS — Z1231 Encounter for screening mammogram for malignant neoplasm of breast: Secondary | ICD-10-CM | POA: Insufficient documentation

## 2021-05-03 ENCOUNTER — Telehealth: Payer: Self-pay

## 2021-05-03 NOTE — Progress Notes (Deleted)
Chronic Care Management Pharmacy Note  05/03/2021 Name:  Kathy Price MRN:  [id number] DOB:  [birth year]  Summary: ***  Recommendations/Changes made from today's visit: ***  Plan: ***   Subjective: Kathy Price is an 65 y.o. year old female who is a primary patient of Steele Sizer, MD.  The CCM team was consulted for assistance with disease management and care coordination needs.    Engaged with patient by telephone for follow up visit in response to provider referral for pharmacy case management and/or care coordination services.   Consent to Services:  The patient was given information about Chronic Care Management services, agreed to services, and gave verbal consent prior to initiation of services.  Please see initial visit note for detailed documentation.   Patient Care Team: Steele Sizer, MD as PCP - General (Family Medicine) Germaine Pomfret, [facility] (Pharmacist) Corey Skains, MD as Consulting Physician (Cardiology)  Recent office visits: 04/06/21: Patient presented to Clemetine Marker, LPN for AWV.  01/04/92: Patient presented to Dr. Ancil Boozer for follow-up. A1c 6.1%.  11/15/20: Patient presented to Dr. Ancil Boozer for ingrown tonail. Patient started on Augmentin, referred to podiatry.   Recent consult visits: 11/07/20: Patient presented to Hassell Done, NP (Cardiology) for follow-up. Considering cardiac defibrillator.   Hospital visits: {Hospital DC Yes/No:25215}   Objective:  Lab Results  Component Value Date   CREATININE 0.93 02/16/2020   BUN 14 02/16/2020   GFRNONAA 64 02/16/2020   GFRAA 75 02/16/2020   NA 143 02/16/2020   K 4.6 02/16/2020   CALCIUM 10.2 02/16/2020   CO2 27 02/16/2020   GLUCOSE 122 (H) 02/16/2020    Lab Results  Component Value Date/Time   HGBA1C 6.1 (A) 02/02/2021 09:55 AM   HGBA1C 6.5 (A) 08/03/2020 10:25 AM   HGBA1C 6.2 02/01/2020 11:30 AM   HGBA1C 6.2 07/17/2019 08:47 AM   MICROALBUR 100 08/03/2020 10:33 AM   MICROALBUR 0.8  07/17/2019 12:00 AM   MICROALBUR 20 01/15/2018 12:34 PM    Last diabetic Eye exam:  Lab Results  Component Value Date/Time   HMDIABEYEEXA No Retinopathy 04/03/2018 12:00 AM    Last diabetic Foot exam: No results found for: HMDIABFOOTEX   Lab Results  Component Value Date   CHOL 124 02/16/2020   HDL 46 (L) 02/16/2020   LDLCALC 59 02/16/2020   TRIG 100 02/16/2020   CHOLHDL 2.7 02/16/2020    Hepatic Function Latest Ref Rng & Units 02/16/2020 03/05/2019 01/20/2018  Total Protein 6.1 - 8.1 g/dL 6.9 7.1 7.1  Albumin 3.5 - 5.0 g/dL - - -  AST 10 - 35 U/L 14 14 14   ALT 6 - 29 U/L 14 13 13   Alk Phosphatase 38 - 126 U/L - - -  Total Bilirubin 0.2 - 1.2 mg/dL 0.4 0.4 0.4    No results found for: TSH, FREET4  CBC Latest Ref Rng & Units 02/16/2020 01/20/2018 08/28/2016  WBC 3.8 - 10.8 Thousand/uL 6.9 6.5 9.7  Hemoglobin 11.7 - 15.5 g/dL 13.2 12.7 12.8  Hematocrit 35.0 - 45.0 % 40.2 37.0 37.9  Platelets 140 - 400 Thousand/uL 266 251 244    Lab Results  Component Value Date/Time   VD25OH 38 01/20/2018 08:14 AM   VD25OH 25 (L) 09/25/2016 09:15 AM    Clinical ASCVD: Yes  The ASCVD Risk score (Goff DC Jr., et al., 2013) failed to calculate for the following reasons:   The valid total cholesterol range is 130 to 320 mg/dL    Depression screen PHQ  2/9 04/06/2021 02/02/2021 11/15/2020  Decreased Interest 0 0 0  Down, Depressed, Hopeless 0 0 0  PHQ - 2 Score 0 0 0  Altered sleeping - - -  Tired, decreased energy - - -  Change in appetite - - -  Feeling bad or failure about yourself  - - -  Trouble concentrating - - -  Moving slowly or fidgety/restless - - -  Suicidal thoughts - - -  PHQ-9 Score - - -  Difficult doing work/chores - - -  Some recent data might be hidden    Social History   Tobacco Use  Smoking Status Former   Packs/day: 1.00   Years: 25.00   Pack years: 25.00   Types: Cigarettes   Quit date: 09/06/2000   Years since quitting: 20.6   Passive exposure: Current  (husband smokes inside)  Smokeless Tobacco Never  Tobacco Comments   Smoking cessation materials not required   BP Readings from Last 3 Encounters:  04/06/21 120/80  02/02/21 136/82  11/15/20 132/80   Pulse Readings from Last 3 Encounters:  04/06/21 84  02/02/21 84  11/15/20 89   Wt Readings from Last 3 Encounters:  04/06/21 244 lb 1.6 oz (110.7 kg)  02/02/21 247 lb (112 kg)  11/15/20 251 lb 12.8 oz (114.2 kg)   BMI Readings from Last 3 Encounters:  04/06/21 44.65 kg/m  02/02/21 45.18 kg/m  11/15/20 46.05 kg/m    Assessment/Interventions: Review of patient past medical history, allergies, medications, health status, including review of consultants reports, laboratory and other test data, was performed as part of comprehensive evaluation and provision of chronic care management services.   SDOH:  (Social Determinants of Health) assessments and interventions performed: Yes  SDOH Screenings   Alcohol Screen: Low Risk    Last Alcohol Screening Score (AUDIT): 0  Depression (PHQ2-9): Low Risk    PHQ-2 Score: 0  Financial Resource Strain: Low Risk    Difficulty of Paying Living Expenses: Not very hard  Food Insecurity: No Food Insecurity   Worried About Charity fundraiser in the Last Year: Never true   Ran Out of Food in the Last Year: Never true  Housing: Low Risk    Last Housing Risk Score: 0  Physical Activity: Insufficiently Active   Days of Exercise per Week: 2 days   Minutes of Exercise per Session: 30 min  Social Connections: Engineer, building services of Communication with Friends and Family: More than three times a week   Frequency of Social Gatherings with Friends and Family: More than three times a week   Attends Religious Services: More than 4 times per year   Active Member of [employer] or Organizations: Yes   Attends Music therapist: More than 4 times per year   Marital Status: Married  Stress: No Stress Concern Present   Feeling of  Stress : Not at all  Tobacco Use: Medium Risk   Smoking Tobacco Use: Former   Smokeless Tobacco Use: Never  Transportation Needs: No Data processing manager (Medical): No   Lack of Transportation (Non-Medical): No    CCM Care Plan  Allergies  Allergen Reactions   Metoprolol Other (See Comments)    severe bradycardia   Pantoprazole Rash   Sulfa Antibiotics Rash and Hives    Medications Reviewed Today     Reviewed by Clemetine Marker, LPN (Licensed Practical Nurse) on 04/06/21 at [phone]  Med List Status: <None>   Medication Order Taking?  Sig Documenting Provider Last Dose Status Informant  aspirin EC 81 MG tablet [id number] Yes Take 1 tablet by mouth daily. [provider] Taking Active Self           Med Note Windle Guard, JENNIFER L   Tue May 15, 2016 10:41 AM)    atorvastatin (LIPITOR) 40 MG tablet [id number] Yes TAKE 1 TABLET BY MOUTH EVERY DAY Sowles, Drue Stager, MD Taking Active   carvedilol (COREG) 3.125 MG tablet [id number]  Take 1 tablet by mouth 2 (two) times a day. Ralene Bathe, MD  Expired 12/23/19 2359   Cholecalciferol (VITAMIN D3) 2000 UNITS capsule [id number] Yes Take 1 capsule by mouth daily. [provider] Taking Active Self           Med Note Windle Guard, JENNIFER L   Tue May 15, 2016 10:41 AM)    empagliflozin (JARDIANCE) 25 MG TABS tablet [id number] Yes Take 1 tablet (25 mg total) by mouth daily. Steele Sizer, MD Taking Active   Multiple Vitamins-Minerals (WOMENS MULTIVITAMIN PO) [id number] Yes Take 1 tablet by mouth.  [provider] Taking Active Self  sacubitril-valsartan (ENTRESTO) 24-26 MG [id number] Yes Take 1 tablet by mouth daily. Ralene Bathe, MD Taking Active   spironolactone (ALDACTONE) 25 MG tablet [id number]  Take by mouth. [provider]  Expired 05/09/20 2359             Patient Active Problem List   Diagnosis Date Noted   Helicobacter pylori gastritis 11/06/2016   Cardiomyopathy, ischemic  01/30/2016   CAD S/P percutaneous coronary angioplasty 09/07/2015   Diabetes mellitus type 2 in obese ([place]) 09/07/2015   Dyslipidemia 09/07/2015   H/O: hysterectomy 09/07/2015   H/O acute myocardial infarction 09/07/2015   Vitamin D deficiency 09/07/2015   Morbid obesity ([place]) 09/07/2015   Bradycardia 05/13/2014   Arteriosclerosis of coronary artery 05/13/2014   Essential (primary) hypertension 05/13/2014   UARS (upper airway resistance syndrome) 01/23/2013    Immunization History  Administered Date(s) Administered   Fluad Quad(high Dose 65+) 08/03/2020   Influenza Inj Mdck Quad Pf 07/17/2019   Influenza, Seasonal, Injecte, Preservative Fre 07/29/2012, 06/29/2014   Influenza,inj,Quad PF,6+ Mos 09/29/2013, 06/28/2017, 08/28/2018   Influenza-Unspecified 06/29/2014, 08/25/2015, 08/16/2016   Moderna Sars-Covid-2 Vaccination 12/26/2019, 01/23/2020   Pneumococcal Conjugate-13 04/07/2010, 04/05/2020   Pneumococcal Polysaccharide-23 04/07/2010, 12/30/2014, 09/07/2015   Td 04/07/2010   Tdap 04/07/2010, 11/15/2020   Zoster, Live 09/25/2016    Conditions to be addressed/monitored:  Hypertension, Hyperlipidemia, Diabetes, Heart Failure, Coronary Artery Disease, and GERD  There are no care plans that you recently modified to display for this patient.    Medication Assistance: ***obtained through *** medication assistance program.  Enrollment ends ***  Compliance/Adherence/Medication fill history: Care Gaps: Shingrix Ophthalmology Exam  Covid Booster  [id number]   Star-Rating Drugs: ***  Patient's preferred pharmacy is:  [id number] [place] [place] [address] [id number]: [phone]: [place] - [id number]: [phone]: [phone] Uses pill box? {Yes or If no, why not?:20788} Pt endorses ***% compliance  We  discussed: {Pharmacy options:24294} Patient decided to: {US Pharmacy Plan:23885}  Care Plan and Follow Up Patient Decision:  {FOLLOWUP:24991}  Plan: {CM FOLLOW UP PXJDB:52080} ***  Current Barriers:  {pharmacybarriers:24917}  Pharmacist Clinical Goal(s):  Patient will {PHARMACYGOALCHOICES:24921} through collaboration with PharmD and provider.   Interventions: 1:1 collaboration with [employer]  Drue Stager, MD regarding development and update of comprehensive plan of care as evidenced by provider attestation and co-signature Inter-disciplinary care team collaboration (see longitudinal plan of care) Comprehensive medication review performed; medication list updated in electronic medical record  Heart Failure (Goal: manage symptoms and prevent exacerbations) -Controlled -Last ejection fraction: 35-40% (Date: ***) -HF type: Systolic -NYHA Class: {CHL HP Upstream Pharm NYHA Class:[phone]} -AHA HF Stage: {CHL HP Upstream Pharm AHA HF Stage:[phone]} -Current treatment: Entresto 24-26 mg daily  Carvedilol 3.125 mg twice daily  Spironolactone 25 mg daily  -Medications previously tried: ***  -Current home BP/HR readings: *** -Current dietary habits: *** -Current exercise habits: *** -Educated on {CCM HF Counseling:25125} -{CCMPHARMDINTERVENTION:25122}   Hyperlipidemia: (LDL goal < ***) -{US controlled/uncontrolled:25276} -History of MI s/p stent 2002 -Current treatment: Atorvastatin 40 mg daily  -Current treatment: Aspirin 81 mg daily  -Medications previously tried: ***  -Current dietary patterns: *** -Current exercise habits: *** -Educated on {CCM HLD Counseling:25126} -{CCMPHARMDINTERVENTION:25122}  Diabetes (A1c goal {A1c goals:23924}) -{US controlled/uncontrolled:25276} -Current medications: Jardiance 25 mg daily  -Medications previously tried: [place], Metformin, Ozempic   -Current home glucose readings fasting glucose: *** post prandial glucose:  *** -{ACTIONS;DENIES/REPORTS:[id number]} hypoglycemic/hyperglycemic symptoms -Current meal patterns:  breakfast: ***  lunch: ***  dinner: *** snacks: *** drinks: *** -Current exercise: *** -Educated on {CCM DM COUNSELING:25123} -Counseled to check feet daily and get yearly eye exams -{CCMPHARMDINTERVENTION:25122}  *** (Goal: ***) -{US controlled/uncontrolled:25276} -Current treatment  *** -Medications previously tried: ***  -{CCMPHARMDINTERVENTION:25122}  Patient Goals/Self-Care Activities Patient will:  - {pharmacypatientgoals:24919}  Follow Up Plan: {CM FOLLOW UP MDYJ:09295}

## 2021-05-03 NOTE — Progress Notes (Signed)
Left Voice message to confirmed patient telephone appointment on 05/04/2021 for CCM at 9:00 am with Angelena Sole the Clinical pharmacist. Left message to have all medications, supplements, blood pressure and  blood sugar logs available during appointment and to return call if need to reschedule.  Star Rating Drug: Atorvastatin 40 mg last filled [facility] Jardiance 25 mg - Patient assistance program Sacubitril-Valsartan 24-26 mg last filled on 11/08/2020 [facility].  Any gaps in medications fill history? Sacubitril-Valsartan 24-26 mg last filled on 11/08/2020 [facility]  Everlean Cherry Clinical Pharmacist Assistant [phone]

## 2021-05-04 ENCOUNTER — Telehealth: Payer: Self-pay

## 2021-05-05 DIAGNOSIS — E119 Type 2 diabetes mellitus without complications: Secondary | ICD-10-CM | POA: Diagnosis not present

## 2021-05-05 DIAGNOSIS — Z0100 Encounter for examination of eyes and vision without abnormal findings: Secondary | ICD-10-CM | POA: Diagnosis not present

## 2021-05-05 LAB — HM DIABETES EYE EXAM

## 2021-05-12 ENCOUNTER — Telehealth: Payer: Self-pay

## 2021-05-12 NOTE — Telephone Encounter (Signed)
Copied from CRM [id number]. Topic: General - Other >> May 12, 2021 11:22 AM Elliot Gault wrote: Patient returning Trin[facility] Post call and would like a follow up call

## 2021-05-22 DIAGNOSIS — Z20822 Contact with and (suspected) exposure to covid-19: Secondary | ICD-10-CM | POA: Diagnosis not present

## 2021-05-22 DIAGNOSIS — Z03818 Encounter for observation for suspected exposure to other biological agents ruled out: Secondary | ICD-10-CM | POA: Diagnosis not present

## 2021-06-19 ENCOUNTER — Encounter: Payer: Self-pay | Admitting: Family Medicine

## 2021-07-06 DIAGNOSIS — I429 Cardiomyopathy, unspecified: Secondary | ICD-10-CM | POA: Diagnosis not present

## 2021-07-06 DIAGNOSIS — I2510 Atherosclerotic heart disease of native coronary artery without angina pectoris: Secondary | ICD-10-CM | POA: Diagnosis not present

## 2021-07-06 DIAGNOSIS — I10 Essential (primary) hypertension: Secondary | ICD-10-CM | POA: Diagnosis not present

## 2021-07-06 DIAGNOSIS — I255 Ischemic cardiomyopathy: Secondary | ICD-10-CM | POA: Diagnosis not present

## 2021-07-06 DIAGNOSIS — E782 Mixed hyperlipidemia: Secondary | ICD-10-CM | POA: Diagnosis not present

## 2021-08-04 ENCOUNTER — Ambulatory Visit: Payer: Medicare Other | Admitting: Family Medicine

## 2021-08-14 ENCOUNTER — Other Ambulatory Visit: Payer: Self-pay

## 2021-08-14 ENCOUNTER — Encounter: Payer: Self-pay | Admitting: Family Medicine

## 2021-08-14 ENCOUNTER — Encounter: Payer: Medicare Other | Admitting: Family Medicine

## 2021-08-14 ENCOUNTER — Ambulatory Visit (INDEPENDENT_AMBULATORY_CARE_PROVIDER_SITE_OTHER): Payer: Medicare Other | Admitting: Family Medicine

## 2021-08-14 VITALS — BP 128/66 | HR 91 | Temp 98.2°F | Resp 18 | Ht 62.0 in | Wt 245.4 lb

## 2021-08-14 DIAGNOSIS — E669 Obesity, unspecified: Secondary | ICD-10-CM | POA: Diagnosis not present

## 2021-08-14 DIAGNOSIS — E1159 Type 2 diabetes mellitus with other circulatory complications: Secondary | ICD-10-CM | POA: Diagnosis not present

## 2021-08-14 DIAGNOSIS — I10 Essential (primary) hypertension: Secondary | ICD-10-CM

## 2021-08-14 DIAGNOSIS — I152 Hypertension secondary to endocrine disorders: Secondary | ICD-10-CM

## 2021-08-14 DIAGNOSIS — E1169 Type 2 diabetes mellitus with other specified complication: Secondary | ICD-10-CM | POA: Diagnosis not present

## 2021-08-14 DIAGNOSIS — E1129 Type 2 diabetes mellitus with other diabetic kidney complication: Secondary | ICD-10-CM | POA: Diagnosis not present

## 2021-08-14 DIAGNOSIS — Z9861 Coronary angioplasty status: Secondary | ICD-10-CM

## 2021-08-14 DIAGNOSIS — R809 Proteinuria, unspecified: Secondary | ICD-10-CM | POA: Diagnosis not present

## 2021-08-14 DIAGNOSIS — K219 Gastro-esophageal reflux disease without esophagitis: Secondary | ICD-10-CM

## 2021-08-14 DIAGNOSIS — Z23 Encounter for immunization: Secondary | ICD-10-CM

## 2021-08-14 DIAGNOSIS — E559 Vitamin D deficiency, unspecified: Secondary | ICD-10-CM

## 2021-08-14 DIAGNOSIS — I429 Cardiomyopathy, unspecified: Secondary | ICD-10-CM

## 2021-08-14 DIAGNOSIS — E785 Hyperlipidemia, unspecified: Secondary | ICD-10-CM

## 2021-08-14 DIAGNOSIS — I2510 Atherosclerotic heart disease of native coronary artery without angina pectoris: Secondary | ICD-10-CM | POA: Diagnosis not present

## 2021-08-14 LAB — POCT GLYCOSYLATED HEMOGLOBIN (HGB A1C): Hemoglobin A1C: 6.3 % — AB (ref 4.0–5.6)

## 2021-08-14 NOTE — Progress Notes (Signed)
Name: Kathy Price   MRN: [id number]    DOB: 1956/08/01   Date:08/14/2021       Progress Note  Subjective  Chief Complaint  Follow Up  HPI  DMII: A1C has been at goal, today it is 6.3 % . She has not been checking her glucose lately. She denies polyphagia, polydipsia or polyuria. Urine micro was elevated she is on ARB. She is currently only taking Jardiance and denies hypoglycemic episode. No polyphagia, polydipsia or polyuria    CAD: s/p MI and stent placement 08/2001,  sees  Dr. Gwen Pounds , last visit 06/2021  and she  has ischemic cardiomyopathy with left wall hypokinesis. Stress test only showed old lesion, no acute ischemia She has been taking apirin 81 mg daily, Entresto, spironolactone , carvedilol and atorvastatin. Last EKG at cardiologist showed frequent PVC's. She denies SOB ,diaphoresis, orthopnea or edema.  Last LDL not at goal., also has high triglycerides    INTERPRETATION 08/2019  MOD LV SYSTOLIC DYSFUNCTION WITH AN ESTIMATED EF = 35-40 % NORMAL RIGHT VENTRICULAR SYSTOLIC FUNCTION MILD TRICUSPID AND MITRAL VALVE INSUFFICIENCY TRACE AORTIC VALVE INSUFFICIENCY NO VALVULAR STENOSIS MILD LV ENLARGEMENT MILD LA ENLARGEMENT   GERD:she had EGD done by Dr. Tobi Bastos back 10/2016 and was treated for h. Pylori gastritis. She is no  longer having indigestion or epigastric pain, she has heart burn very seldom usually triggered by diet, taking Tums prn only    Morbid Obesity: she gained 20 lbs from  07/2018 until 2021, stopped working August 2019 and has not been as active, states also gained more since COVID. She had  lost 5 lbs before last visit and lost 2 more pounds since April. she states trying to cut down on sweets and fried food, she states she has been riding her bike once or twice a week, for about 20 minutes. Reminded her again to ride 20 minutes 7 days a weeks    Hyperlipidemia: she had labs done by cardiologist in 10/2020 and LDL was up to 108, last year it was 59. She states  she has been compliant with statin therapy now and we will recheck labs    Patient Active Problem List   Diagnosis Date Noted   Helicobacter pylori gastritis 11/06/2016   Cardiomyopathy, ischemic 01/30/2016   CAD S/P percutaneous coronary angioplasty 09/07/2015   Diabetes mellitus type 2 in obese ([facility]) 09/07/2015   Dyslipidemia 09/07/2015   H/O: hysterectomy 09/07/2015   H/O acute myocardial infarction 09/07/2015   Vitamin D deficiency 09/07/2015   Morbid obesity ([facility]) 09/07/2015   Bradycardia 05/13/2014   Arteriosclerosis of coronary artery 05/13/2014   Essential (primary) hypertension 05/13/2014   UARS (upper airway resistance syndrome) 01/23/2013    Past Surgical History:  Procedure Laterality Date   ABDOMINAL HYSTERECTOMY     COLONOSCOPY     COLONOSCOPY WITH PROPOFOL N/A 05/09/2020   Procedure: COLONOSCOPY WITH PROPOFOL;  Surgeon: Wyline Mood, MD;  Location[facility];  Service: Gastroenterology;  Laterality: N/A;   CORONARY ANGIOPLASTY  2002   stents   ESOPHAGOGASTRODUODENOSCOPY (EGD) WITH PROPOFOL N/A 11/05/2016   Procedure: ESOPHAGOGASTRODUODENOSCOPY (EGD) WITH PROPOFOL;  Surgeon: Wyline Mood, MD;  Location: [facility] ENDOSCOPY;  Service: Endoscopy;  Laterality: N/A;    Family History  Problem Relation Age of Onset   Diabetes Mother    Hypertension Mother    Cancer Father    CAD Brother    Breast cancer Sister 46    Social History   Tobacco Use   Smoking status:  Former    Packs/day: 1.00    Years: 25.00    Pack years: 25.00    Types: Cigarettes    Quit date: 09/06/2000    Years since quitting: 20.9    Passive exposure: Current (husband smokes inside)   Smokeless tobacco: Never   Tobacco comments:    Smoking cessation materials not required  Substance Use Topics   Alcohol use: No    Alcohol/week: 0.0 standard drinks     Current Outpatient Medications:    aspirin EC 81 MG tablet, Take 1 tablet by mouth daily., Disp: , Rfl:    atorvastatin (LIPITOR) 40 MG  tablet, TAKE 1 TABLET BY MOUTH EVERY DAY, Disp: 90 tablet, Rfl: 1   Cholecalciferol (VITAMIN D3) 2000 UNITS capsule, Take 1 capsule by mouth daily., Disp: , Rfl:    empagliflozin (JARDIANCE) 25 MG TABS tablet, Take 1 tablet (25 mg total) by mouth daily., Disp: 90 tablet, Rfl: 3   Multiple Vitamins-Minerals (WOMENS MULTIVITAMIN PO), Take 1 tablet by mouth. , Disp: , Rfl:    sacubitril-valsartan (ENTRESTO) 24-26 MG, Take 1 tablet by mouth daily., Disp: , Rfl:    carvedilol (COREG) 3.125 MG tablet, Take 1 tablet by mouth 2 (two) times a day., Disp: , Rfl:    spironolactone (ALDACTONE) 25 MG tablet, Take by mouth., Disp: , Rfl:   Allergies  Allergen Reactions   Metoprolol Other (See Comments)    severe bradycardia   Pantoprazole Rash   Sulfa Antibiotics Rash and Hives    I personally reviewed active problem list, medication list, allergies, family history, social history, health maintenance with the patient/caregiver today.   ROS  Constitutional: Negative for fever or weight change.  Respiratory: Negative for cough and shortness of breath.   Cardiovascular: Negative for chest pain or palpitations.  Gastrointestinal: Negative for abdominal pain, no bowel changes.  Musculoskeletal: Negative for gait problem or joint swelling.  Skin: Negative for rash.  Neurological: Negative for dizziness or headache.  No other specific complaints in a complete review of systems (except as listed in HPI above).   Objective  Vitals:   08/14/21 0852  BP: 128/66  Pulse: 91  Resp: 18  Temp: 98.2 F (36.8 C)  TempSrc: Oral  SpO2: 99%  Weight: 245 lb 6.4 oz (111.3 kg)  Height: 5\' 2"  (1.575 m)    Body mass index is 44.88 kg/m.  Physical Exam  Constitutional: Patient appears well-developed and well-nourished. Obese  No distress.  HEENT: head atraumatic, normocephalic, pupils equal and reactive to light, neck supple Cardiovascular: Normal rate, regular rhythm and normal heart sounds.  2/6  systolic  murmur heard. No BLE edema. Pulmonary/Chest: Effort normal and breath sounds normal. No respiratory distress. Abdominal: Soft.  There is no tenderness. Psychiatric: Patient has a normal mood and affect. behavior is normal. Judgment and thought content normal.   Recent Results (from the past 2160 hour(s))  POCT HgB A1C     Status: Abnormal   Collection Time: 08/14/21  9:06 AM  Result Value Ref Range   Hemoglobin A1C 6.3 (A) 4.0 - 5.6 %   HbA1c POC (<> result, manual entry)     HbA1c, POC (prediabetic range)     HbA1c, POC (controlled diabetic range)        PHQ2/9: Depression screen [facility] 2/9 08/14/2021 04/06/2021 02/02/2021 11/15/2020 08/03/2020  Decreased Interest 0 0 0 0 0  Down, Depressed, Hopeless 0 0 0 0 0  PHQ - 2 Score 0 0 0 0 0  Altered  sleeping 0 - - - 0  Tired, decreased energy 0 - - - 0  Change in appetite 0 - - - 0  Feeling bad or failure about yourself  0 - - - 0  Trouble concentrating 0 - - - 0  Moving slowly or fidgety/restless 0 - - - 0  Suicidal thoughts 0 - - - 0  PHQ-9 Score 0 - - - 0  Difficult doing work/chores - - - - Not difficult at all  Some recent data might be hidden    phq 9 is negative   Fall Risk: Fall Risk  08/14/2021 04/06/2021 02/02/2021 11/15/2020 08/03/2020  Falls in the past year? 0 0 0 0 0  Number falls in past yr: 0 0 0 0 0  Injury with Fall? 0 0 0 0 0  Risk for fall due to : No Fall Risks No Fall Risks - - -  Follow up Falls prevention discussed Falls prevention discussed - - -      Functional Status Survey: Is the patient deaf or have difficulty hearing?: No Does the patient have difficulty seeing, even when wearing glasses/contacts?: No Does the patient have difficulty concentrating, remembering, or making decisions?: No Does the patient have difficulty walking or climbing stairs?: No Does the patient have difficulty dressing or bathing?: No Does the patient have difficulty doing errands alone such as visiting a doctor's office  or shopping?: No    Assessment & Plan  1. Diabetes mellitus type 2 in obese (HCC)  - POCT HgB A1C  2. Need for influenza vaccination  - Flu Vaccine QUAD High Dose(Fluad)  3. CAD S/P percutaneous coronary angioplasty   4. Dyslipidemia associated with type 2 diabetes mellitus (HCC)  - Lipid panel - COMPLETE METABOLIC PANEL WITH GFR  5. Morbid obesity (HCC)  Discussed with the patient the risk posed by an increased BMI. Discussed importance of portion control, calorie counting and at least 150 minutes of physical activity weekly. Avoid sweet beverages and drink more water. Eat at least 6 servings of fruit and vegetables daily    6. Essential (primary) hypertension  - CBC with Differential/Platelet  7. Cardiomyopathy, unspecified type (HCC)   8. Gastroesophageal reflux disease without esophagitis   9. Hypertension associated with type 2 diabetes mellitus (HCC)   10. Vitamin D deficiency   11. Dyslipidemia   12. Controlled type 2 diabetes mellitus with microalbuminuria, without long-term current use of insulin (HCC)  - Urine Microalbumin w/creat. ratio

## 2021-08-15 LAB — CBC WITH DIFFERENTIAL/PLATELET
Absolute Monocytes: 326 cells/uL (ref 200–950)
Basophils Absolute: 32 cells/uL (ref 0–200)
Basophils Relative: 0.5 %
Eosinophils Absolute: 192 cells/uL (ref 15–500)
Eosinophils Relative: 3 %
HCT: 40 % (ref 35.0–45.0)
Hemoglobin: 13.3 g/dL (ref 11.7–15.5)
Lymphs Abs: 2362 cells/uL (ref 850–3900)
MCH: 28.2 pg (ref 27.0–33.0)
MCHC: 33.3 g/dL (ref 32.0–36.0)
MCV: 84.7 fL (ref 80.0–100.0)
MPV: 10.6 fL (ref 7.5–12.5)
Monocytes Relative: 5.1 %
Neutro Abs: 3488 cells/uL (ref 1500–7800)
Neutrophils Relative %: 54.5 %
Platelets: 258 10*3/uL (ref 140–400)
RBC: 4.72 10*6/uL (ref 3.80–5.10)
RDW: 14.4 % (ref 11.0–15.0)
Total Lymphocyte: 36.9 %
WBC: 6.4 10*3/uL (ref 3.8–10.8)

## 2021-08-15 LAB — COMPLETE METABOLIC PANEL WITH GFR
AG Ratio: 1.6 (calc) (ref 1.0–2.5)
ALT: 9 U/L (ref 6–29)
AST: 12 U/L (ref 10–35)
Albumin: 4.5 g/dL (ref 3.6–5.1)
Alkaline phosphatase (APISO): 80 U/L (ref 37–153)
BUN: 17 mg/dL (ref 7–25)
CO2: 23 mmol/L (ref 20–32)
Calcium: 10.5 mg/dL — ABNORMAL HIGH (ref 8.6–10.4)
Chloride: 106 mmol/L (ref 98–110)
Creat: 1.03 mg/dL (ref 0.50–1.05)
Globulin: 2.8 g/dL (calc) (ref 1.9–3.7)
Glucose, Bld: 109 mg/dL — ABNORMAL HIGH (ref 65–99)
Potassium: 4.2 mmol/L (ref 3.5–5.3)
Sodium: 140 mmol/L (ref 135–146)
Total Bilirubin: 0.4 mg/dL (ref 0.2–1.2)
Total Protein: 7.3 g/dL (ref 6.1–8.1)
eGFR: 60 mL/min/{1.73_m2} (ref >=60)

## 2021-08-15 LAB — LIPID PANEL
Cholesterol: 152 mg/dL (ref <200)
HDL: 53 mg/dL (ref >=50)
LDL Cholesterol (Calc): 76 mg/dL (calc)
Non-HDL Cholesterol (Calc): 99 mg/dL (calc) (ref <130)
Total CHOL/HDL Ratio: 2.9 (calc) (ref <5.0)
Triglycerides: 145 mg/dL (ref <150)

## 2021-08-15 LAB — MICROALBUMIN / CREATININE URINE RATIO
Creatinine, Urine: 55 mg/dL (ref 20–275)
Microalb Creat Ratio: 31 mcg/mg creat — ABNORMAL HIGH (ref <30)
Microalb, Ur: 1.7 mg/dL

## 2021-08-30 ENCOUNTER — Telehealth: Payer: Self-pay

## 2021-08-30 NOTE — Progress Notes (Signed)
Chronic Care Management Pharmacy Assistant   Name: Kathy Price  MRN: [id number] DOB: 1956/08/12  Reason for Encounter: Diabetes Disease State Call/ PAP for Jardiance   Recent office visits:  08/14/2021 Alba Cory, MD (PCP Office Visit) for Follow-up No medication changes noted, lab order placed, no follow-up noted  04/06/2021 Reather Littler, LPN (PCP Office Clinical Support) Medicare Wellness Visit, MM 3D Screen Breast placed, no medication changes noted  02/02/2021 Alba Cory, MD (PCP Office Visit) for Follow-up - Discontinued: Amoxicillin 875-125 mg, Gentamicin Sulfate 0.1% due to patient completed course, A1C lab ordered, patient instructed to follow-up in 6 months  11/15/2020 Alba Cory, MD (PCP Office Visit) for Nail Problem- Started:  Amoxicillin-Pot 875-125 mg 1 tablet twice daily, Referral to Podiatry placed, no follow-up noted  Recent consult visits:  07/06/2021 Olena Heckle, MD (Cardiology) for Follow-up- No medication changes noted, no orders placed, patient instructed to return in 6 months  12/02/2020 Gala Lewandowsky, DPM (Podiatry) for Ingrown Toenail- No medication changes noted, no orders placed, patient to follow-up prn.   11/18/2020 Gala Lewandowsky, DPM (Podiatry) for Ingrown Toenail- Started: Gentamicin Sulfate 0.1%, no orders placed, patient instructed to follow-up in 2 weeks  11/07/2020 Vance Gather, NP (Cardiology) for Coronary Artery Disease - No medication changes noted, no orders placed, patient to follow-up in 6 months  Hospital visits:  None in previous 6 months  Medications: Outpatient Encounter Medications as of 08/30/2021  Medication Sig   aspirin EC 81 MG tablet Take 1 tablet by mouth daily.   atorvastatin (LIPITOR) 40 MG tablet TAKE 1 TABLET BY MOUTH EVERY DAY   carvedilol (COREG) 3.125 MG tablet Take 1 tablet by mouth 2 (two) times a day.   Cholecalciferol (VITAMIN D3) 2000 UNITS capsule Take 1 capsule by mouth daily.   empagliflozin  (JARDIANCE) 25 MG TABS tablet Take 1 tablet (25 mg total) by mouth daily.   Multiple Vitamins-Minerals (WOMENS MULTIVITAMIN PO) Take 1 tablet by mouth.    sacubitril-valsartan (ENTRESTO) 24-26 MG Take 1 tablet by mouth daily.   spironolactone (ALDACTONE) 25 MG tablet Take by mouth.   No facility-administered encounter medications on file as of 08/30/2021.   Care Gaps: Zoster Vaccines COVID-19 Vaccine Booster 3 PNA Vaccine  Star Rating Drugs: Jardiance 25 mg received from patient assistance via [employer] until 10/28/2021 Atorvastatin 40 mg last filled on 06/20/2021 for a 90-Day supply with [facility] Entresto 24-26 mg last filled on 11/08/2020 for a 90-Day supply with [facility] Recent Relevant Labs: Lab Results  Component Value Date/Time   HGBA1C 6.3 (A) 08/14/2021 09:06 AM   HGBA1C 6.1 (A) 02/02/2021 09:55 AM   HGBA1C 6.2 02/01/2020 11:30 AM   HGBA1C 6.2 07/17/2019 08:47 AM   MICROALBUR 1.7 08/14/2021 09:41 AM   MICROALBUR 100 08/03/2020 10:33 AM   MICROALBUR 0.8 07/17/2019 12:00 AM   MICROALBUR 20 01/15/2018 12:34 PM    Kidney Function Lab Results  Component Value Date/Time   CREATININE 1.03 08/14/2021 09:41 AM   CREATININE 0.93 02/16/2020 08:30 AM   GFRNONAA 64 02/16/2020 08:30 AM   GFRAA 75 02/16/2020 08:30 AM    Current antihyperglycemic regimen:  Jardiance 25 mg 1 tablet daily  What recent interventions/DTPs have been made to improve glycemic control:  None ID  Have there been any recent hospitalizations or ED visits since last visit with CPP? No  Patient denies hypoglycemic symptoms, including Pale, Sweaty, Shaky, Hungry, Nervous/irritable, and Vision changes  Patient denies hyperglycemic symptoms, including blurry vision,  excessive thirst, fatigue, polyuria, and weakness  How often are you checking your blood sugar? 3-4 times daily What are your blood sugars ranging?  Fasting: nothing below 70 and most of the time nothing over 120  During the week, how  often does your blood glucose drop below 70? Never  Are you checking your feet daily/regularly? No current issues with patients feet  Adherence Review: Is the patient currently on a STATIN medication? Yes Is the patient currently on ACE/ARB medication? Yes Does the patient have >5 day gap between last estimated fill dates? Yes  I spoke with the patient, and she reports that she is doing well. The patient denies any ill symptoms, no hypoglycemic symptoms, and no hyperglycemic symptoms at this time. The patient stated that she did received renewal paperwork in the mail from [employer], and she is in the process of completing the application, and gathering her documents to bring to the office for CPP to fax to [employer] @ [phone]. I informed the patient that once CPP faxes over the application he will inform me so I can follow-up. Patient is aware that the application has to be turned in prior to 10/28/2021. Patient has no concerns or issues at this time. Patient was provided my direct number for future communication.  Future telephone appointment with CPP was scheduled for 12/13/2021 @ 1500.   Adelene Idler, CPA/CMA Clinical Pharmacist Assistant Phone: [phone]

## 2021-09-14 NOTE — Progress Notes (Signed)
Name: Kathy Price   MRN: [id number]    DOB: 24-Feb-1956   Date:09/15/2021       Progress Note  Subjective  Chief Complaint  Annual Exam  HPI  Patient presents for annual CPE.  Diet: using air fryer, she likes vegetables, she needs to cut down on carbs  Exercise:  needs to increase her physical activity   Flowsheet Row Clinical Support from 04/06/2021 in [facility]  AUDIT-C Score 0      Depression: Phq 9 is  negative Depression screen [facility] 2/9 09/15/2021 08/14/2021 04/06/2021 02/02/2021 11/15/2020  Decreased Interest 0 0 0 0 0  Down, Depressed, Hopeless 0 0 0 0 0  PHQ - 2 Score 0 0 0 0 0  Altered sleeping 0 0 - - -  Tired, decreased energy 0 0 - - -  Change in appetite 0 0 - - -  Feeling bad or failure about yourself  0 0 - - -  Trouble concentrating 0 0 - - -  Moving slowly or fidgety/restless 0 0 - - -  Suicidal thoughts 0 0 - - -  PHQ-9 Score 0 0 - - -  Difficult doing work/chores Not difficult at all - - - -  Some recent data might be hidden   Hypertension: BP Readings from Last 3 Encounters:  09/15/21 124/72  08/14/21 128/66  04/06/21 120/80   Obesity: Wt Readings from Last 3 Encounters:  09/15/21 244 lb 1.6 oz (110.7 kg)  08/14/21 245 lb 6.4 oz (111.3 kg)  04/06/21 244 lb 1.6 oz (110.7 kg)   BMI Readings from Last 3 Encounters:  09/15/21 44.65 kg/m  08/14/21 44.88 kg/m  04/06/21 44.65 kg/m     Vaccines:   Shingrix: we will send it to pharmacy  Pneumonia: educated and discussed with patient. Flu: educated and discussed with patient.  Hep C Screening: 07/28/13 STD testing and prevention (HIV/chl/gon/syphilis): N/A Intimate partner violence: N/A Sexual History : still sexually active with husband  Menstrual History/LMP/Abnormal Bleeding: s/p hysterectomy  Incontinence Symptoms: no problems   Breast cancer:  - Last Mammogram: 04/14/21 - BRCA gene screening: N/A  Osteoporosis: Discussed high calcium and vitamin D supplementation,  weight bearing exercises  Cervical cancer screening: s/p hysterectomy   Skin cancer: Discussed monitoring for atypical lesions  Colorectal cancer: 05/09/20   Lung cancer: Low Dose CT Chest recommended if Age 18-80 years, 14 pack-year currently smoking OR have quit w/in 15years. Patient does not qualify.   ECG: 04/05/20  Advanced Care Planning: A voluntary discussion about advance care planning including the explanation and discussion of advance directives.  Discussed health care proxy and Living will, and the patient was able to identify a health care proxy as oldest daughter    Lipids: Lab Results  Component Value Date   CHOL 152 08/14/2021   CHOL 124 02/16/2020   CHOL 135 03/05/2019   Lab Results  Component Value Date   HDL 53 08/14/2021   HDL 46 (L) 02/16/2020   HDL 51 03/05/2019   Lab Results  Component Value Date   LDLCALC 76 08/14/2021   LDLCALC 59 02/16/2020   LDLCALC 61 03/05/2019   Lab Results  Component Value Date   TRIG 145 08/14/2021   TRIG 100 02/16/2020   TRIG 151 (H) 03/05/2019   Lab Results  Component Value Date   CHOLHDL 2.9 08/14/2021   CHOLHDL 2.7 02/16/2020   CHOLHDL 2.6 03/05/2019   No results found for: LDLDIRECT  Glucose: Glucose  Date  Value Ref Range Status  02/05/2015 140 (H) mg/dL Final    Comment:    65-99 NOTE: New Reference Range  01/04/15    Glucose, Bld  Date Value Ref Range Status  08/14/2021 109 (H) 65 - 99 mg/dL Final    Comment:    .            Fasting reference interval . For someone without known diabetes, a glucose value between 100 and 125 mg/dL is consistent with prediabetes and should be confirmed with a follow-up test. .   02/16/2020 122 (H) 65 - 99 mg/dL Final    Comment:    .            Fasting reference interval . For someone without known diabetes, a glucose value between 100 and 125 mg/dL is consistent with prediabetes and should be confirmed with a follow-up test. .   03/05/2019 124 (H) 65 - 99  mg/dL Final    Comment:    .            Fasting reference interval . For someone without known diabetes, a glucose value between 100 and 125 mg/dL is consistent with prediabetes and should be confirmed with a follow-up test. .    Glucose-Capillary  Date Value Ref Range Status  05/09/2020 121 (H) 70 - 99 mg/dL Final    Comment:    Glucose reference range applies only to samples taken after fasting for at least 8 hours.  11/05/2016 116 (H) 65 - 99 mg/dL Final    Patient Active Problem List   Diagnosis Date Noted   Helicobacter pylori gastritis 11/06/2016   Cardiomyopathy, ischemic 01/30/2016   CAD S/P percutaneous coronary angioplasty 09/07/2015   Diabetes mellitus type 2 in obese ([place]) 09/07/2015   Dyslipidemia 09/07/2015   H/O: hysterectomy 09/07/2015   H/O acute myocardial infarction 09/07/2015   Vitamin D deficiency 09/07/2015   Morbid obesity ([place]) 09/07/2015   Bradycardia 05/13/2014   Arteriosclerosis of coronary artery 05/13/2014   Essential (primary) hypertension 05/13/2014   UARS (upper airway resistance syndrome) 01/23/2013    Past Surgical History:  Procedure Laterality Date   ABDOMINAL HYSTERECTOMY     COLONOSCOPY     COLONOSCOPY WITH PROPOFOL N/A 05/09/2020   Procedure: COLONOSCOPY WITH PROPOFOL;  Surgeon: Jonathon Bellows, MD;  Location: [facility] ENDOSCOPY;  Service: Gastroenterology;  Laterality: N/A;   CORONARY ANGIOPLASTY  2002   stents   ESOPHAGOGASTRODUODENOSCOPY (EGD) WITH PROPOFOL N/A 11/05/2016   Procedure: ESOPHAGOGASTRODUODENOSCOPY (EGD) WITH PROPOFOL;  Surgeon: Jonathon Bellows, MD;  Location: [facility] ENDOSCOPY;  Service: Endoscopy;  Laterality: N/A;    Family History  Problem Relation Age of Onset   Diabetes Mother    Hypertension Mother    Cancer Father    CAD Brother    Breast cancer Sister 45    Social History   Socioeconomic History   Marital status: Married    Spouse name: Cornelia Copa   Number of children: 3   Years of education: Not on file    Highest education level: GED or equivalent  Occupational History   Occupation: retired  Tobacco Use   Smoking status: Former    Packs/day: 1.00    Years: 25.00    Pack years: 25.00    Types: Cigarettes    Quit date: 09/06/2000    Years since quitting: 21.0    Passive exposure: Current (husband smokes inside)   Smokeless tobacco: Never   Tobacco comments:    Smoking cessation materials not required  Vaping Use   Vaping Use: Never used  Substance and Sexual Activity   Alcohol use: No    Alcohol/week: 0.0 standard drinks   Drug use: No   Sexual activity: Yes    Partners: Male  Other Topics Concern   Not on file  Social History Narrative   Not on file   Social Determinants of Health   Financial Resource Strain: Low Risk    Difficulty of Paying Living Expenses: Not hard at all  Food Insecurity: No Food Insecurity   Worried About Charity fundraiser in the Last Year: Never true   [place] in the Last Year: Never true  Transportation Needs: No Transportation Needs   Lack of Transportation (Medical): No   Lack of Transportation (Non-Medical): No  Physical Activity: Insufficiently Active   Days of Exercise per Week: 2 days   Minutes of Exercise per Session: 30 min  Stress: No Stress Concern Present   Feeling of Stress : Not at all  Social Connections: Socially Integrated   Frequency of Communication with Friends and Family: More than three times a week   Frequency of Social Gatherings with Friends and Family: More than three times a week   Attends Religious Services: 1 to 4 times per year   Active Member of [employer] or Organizations: Yes   Attends Music therapist: More than 4 times per year   Marital Status: Married  Human resources officer Violence: Not At Risk   Fear of Current or Ex-Partner: No   Emotionally Abused: No   Physically Abused: No   Sexually Abused: No     Current Outpatient Medications:    aspirin EC 81 MG tablet, Take 1 tablet by mouth  daily., Disp: , Rfl:    atorvastatin (LIPITOR) 40 MG tablet, TAKE 1 TABLET BY MOUTH EVERY DAY, Disp: 90 tablet, Rfl: 1   Cholecalciferol (VITAMIN D3) 2000 UNITS capsule, Take 1 capsule by mouth daily., Disp: , Rfl:    empagliflozin (JARDIANCE) 25 MG TABS tablet, Take 1 tablet (25 mg total) by mouth daily., Disp: 90 tablet, Rfl: 3   Multiple Vitamins-Minerals (WOMENS MULTIVITAMIN PO), Take 1 tablet by mouth. , Disp: , Rfl:    sacubitril-valsartan (ENTRESTO) 24-26 MG, Take 1 tablet by mouth daily., Disp: , Rfl:    Zoster Vaccine Adjuvanted (SHINGRIX) injection, Inject 0.5 mLs into the muscle once for 1 dose., Disp: 0.5 mL, Rfl: 1   carvedilol (COREG) 3.125 MG tablet, Take 1 tablet by mouth 2 (two) times a day., Disp: , Rfl:    spironolactone (ALDACTONE) 25 MG tablet, Take by mouth., Disp: , Rfl:   Allergies  Allergen Reactions   Metoprolol Other (See Comments)    severe bradycardia   Pantoprazole Rash   Sulfa Antibiotics Rash and Hives     ROS  Constitutional: Negative for fever or weight change.  Respiratory: Negative for cough and shortness of breath.   Cardiovascular: Negative for chest pain or palpitations.  Gastrointestinal: Negative for abdominal pain, no bowel changes.  Musculoskeletal: Negative for gait problem or joint swelling.  Skin: Negative for rash.  Neurological: Negative for dizziness or headache.  No other specific complaints in a complete review of systems (except as listed in HPI above).   Objective  Vitals:   09/15/21 0858  BP: 124/72  Pulse: 86  Resp: 16  Temp: 98.4 F (36.9 C)  TempSrc: Oral  SpO2: 98%  Weight: 244 lb 1.6 oz (110.7 kg)  Height: 5' 2" (1.575  m)    Body mass index is 44.65 kg/m.  Physical Exam  Constitutional: Patient appears well-developed and well-nourished. Obese  No distress.  HENT: Head: Normocephalic and atraumatic. Ears: B TMs ok, no erythema or effusion; Nose: Not done  Mouth/Throat: not done Eyes: Conjunctivae and EOM are  normal. Pupils are equal, round, and reactive to light. No scleral icterus.  Neck: Normal range of motion. Neck supple. No JVD present. No thyromegaly present.  Cardiovascular: Normal rate, regular rhythm and normal heart sounds.  No murmur heard. No BLE edema. Pulmonary/Chest: Effort normal and breath sounds normal. No respiratory distress. Abdominal: Soft. Bowel sounds are normal, no distension. There is no tenderness. no masses Breast: no lumps or masses, no nipple discharge or rashes FEMALE GENITALIA:  Not done  RECTAL: not done  Musculoskeletal: Normal range of motion, no joint effusions. No gross deformities Neurological: he is alert and oriented to person, place, and time. No cranial nerve deficit. Coordination, balance, strength, speech and gait are normal.  Skin: Skin is warm and dry. No rash noted. No erythema.  Psychiatric: Patient has a normal mood and affect. behavior is normal. Judgment and thought content normal.   Recent Results (from the past 2160 hour(s))  POCT HgB A1C     Status: Abnormal   Collection Time: 08/14/21  9:06 AM  Result Value Ref Range   Hemoglobin A1C 6.3 (A) 4.0 - 5.6 %   HbA1c POC (<> result, manual entry)     HbA1c, POC (prediabetic range)     HbA1c, POC (controlled diabetic range)    Lipid panel     Status: None   Collection Time: 08/14/21  9:41 AM  Result Value Ref Range   Cholesterol 152 <200 mg/dL   HDL 53 > OR = 50 mg/dL   Triglycerides 145 <150 mg/dL   LDL Cholesterol (Calc) 76 mg/dL (calc)    Comment: Reference range: <100 . Desirable range <100 mg/dL for primary prevention;   <70 mg/dL for patients with CHD or diabetic patients  with > or = 2 CHD risk factors. Marland Kitchen LDL-C is now calculated using the Martin-Hopkins  calculation, which is a validated novel method providing  better accuracy than the Friedewald equation in the  estimation of LDL-C.  Cresenciano Genre et al. Annamaria Helling. 3646;803(21): 2061-2068   (http://education.[facility].com/faq/FAQ164)    Total CHOL/HDL Ratio 2.9 <5.0 (calc)   Non-HDL Cholesterol (Calc) 99 <130 mg/dL (calc)    Comment: For patients with diabetes plus 1 major ASCVD risk  factor, treating to a non-HDL-C goal of <100 mg/dL  (LDL-C of <70 mg/dL) is considered a therapeutic  option.   COMPLETE METABOLIC PANEL WITH GFR     Status: Abnormal   Collection Time: 08/14/21  9:41 AM  Result Value Ref Range   Glucose, Bld 109 (H) 65 - 99 mg/dL    Comment: .            Fasting reference interval . For someone without known diabetes, a glucose value between 100 and 125 mg/dL is consistent with prediabetes and should be confirmed with a follow-up test. .    BUN 17 7 - 25 mg/dL   Creat 1.03 0.50 - 1.05 mg/dL   eGFR 60 > OR = 60 mL/min/1.75m    Comment: The eGFR is based on the CKD-EPI 2021 equation. To calculate  the new eGFR from a previous Creatinine or Cystatin C result, go to https://www.kidney.org/professionals/ kdoqi/gfr%5Fcalculator    BUN/Creatinine Ratio NOT APPLICABLE 6 - 22 (calc)  Sodium 140 135 - 146 mmol/L   Potassium 4.2 3.5 - 5.3 mmol/L   Chloride 106 98 - 110 mmol/L   CO2 23 20 - 32 mmol/L   Calcium 10.5 (H) 8.6 - 10.4 mg/dL   Total Protein 7.3 6.1 - 8.1 g/dL   Albumin 4.5 3.6 - 5.1 g/dL   Globulin 2.8 1.9 - 3.7 g/dL (calc)   AG Ratio 1.6 1.0 - 2.5 (calc)   Total Bilirubin 0.4 0.2 - 1.2 mg/dL   Alkaline phosphatase (APISO) 80 37 - 153 U/L   AST 12 10 - 35 U/L   ALT 9 6 - 29 U/L  CBC with Differential/Platelet     Status: None   Collection Time: 08/14/21  9:41 AM  Result Value Ref Range   WBC 6.4 3.8 - 10.8 Thousand/uL   RBC 4.72 3.80 - 5.10 Million/uL   Hemoglobin 13.3 11.7 - 15.5 g/dL   HCT 40.0 35.0 - 45.0 %   MCV 84.7 80.0 - 100.0 fL   MCH 28.2 27.0 - 33.0 pg   MCHC 33.3 32.0 - 36.0 g/dL   RDW 14.4 11.0 - 15.0 %   Platelets 258 140 - 400 Thousand/uL   MPV 10.6 7.5 - 12.5 fL   Neutro Abs 3,488 1,500 - 7,800 cells/uL    Lymphs Abs 2,362 850 - 3,900 cells/uL   Absolute Monocytes 326 200 - 950 cells/uL   Eosinophils Absolute 192 15 - 500 cells/uL   Basophils Absolute 32 0 - 200 cells/uL   Neutrophils Relative % 54.5 %   Total Lymphocyte 36.9 %   Monocytes Relative 5.1 %   Eosinophils Relative 3.0 %   Basophils Relative 0.5 %  Urine Microalbumin w/creat. ratio     Status: Abnormal   Collection Time: 08/14/21  9:41 AM  Result Value Ref Range   Creatinine, Urine 55 20 - 275 mg/dL   Microalb, Ur 1.7 mg/dL    Comment: Reference Range Not established    Microalb Creat Ratio 31 (H) <30 mcg/mg creat    Comment: . The ADA defines abnormalities in albumin excretion as follows: Marland Kitchen Albuminuria Category        Result (mcg/mg creatinine) . Normal to Mildly increased   <30 Moderately increased         30-299  Severely increased           > OR = 300 . The ADA recommends that at least two of three specimens collected within a 3-6 month period be abnormal before considering a patient to be within a diagnostic category.      Fall Risk: Fall Risk  09/15/2021 08/14/2021 04/06/2021 02/02/2021 11/15/2020  Falls in the past year? 0 0 0 0 0  Number falls in past yr: 0 0 0 0 0  Injury with Fall? 0 0 0 0 0  Risk for fall due to : No Fall Risks No Fall Risks No Fall Risks - -  Follow up Falls prevention discussed Falls prevention discussed Falls prevention discussed - -     Functional Status Survey: Is the patient deaf or have difficulty hearing?: No Does the patient have difficulty seeing, even when wearing glasses/contacts?: No Does the patient have difficulty concentrating, remembering, or making decisions?: No Does the patient have difficulty walking or climbing stairs?: No Does the patient have difficulty dressing or bathing?: No Does the patient have difficulty doing errands alone such as visiting a doctor's office or shopping?: No   Assessment & Plan  1. Well adult  exam   2. Need for shingles  vaccine  - Zoster Vaccine Adjuvanted [facility]) injection; Inject 0.5 mLs into the muscle once for 1 dose.  Dispense: 0.5 mL; Refill: 1  3. Need for pneumococcal vaccine  - Pneumococcal conjugate vaccine 20-valent (Prevnar 20)    -USPSTF grade A and B recommendations reviewed with patient; age-appropriate recommendations, preventive care, screening tests, etc discussed and encouraged; healthy living encouraged; see AVS for patient education given to patient -Discussed importance of 150 minutes of physical activity weekly, eat two servings of fish weekly, eat one serving of tree nuts ( cashews, pistachios, pecans, almonds.Marland Kitchen) every other day, eat 6 servings of fruit/vegetables daily and drink plenty of water and avoid sweet beverages.

## 2021-09-15 ENCOUNTER — Other Ambulatory Visit: Payer: Self-pay

## 2021-09-15 ENCOUNTER — Other Ambulatory Visit: Payer: Self-pay | Admitting: Family Medicine

## 2021-09-15 ENCOUNTER — Encounter: Payer: Self-pay | Admitting: Family Medicine

## 2021-09-15 ENCOUNTER — Ambulatory Visit (INDEPENDENT_AMBULATORY_CARE_PROVIDER_SITE_OTHER): Payer: Medicare Other | Admitting: Family Medicine

## 2021-09-15 VITALS — BP 124/72 | HR 86 | Temp 98.4°F | Resp 16 | Ht 62.0 in | Wt 244.1 lb

## 2021-09-15 DIAGNOSIS — E1169 Type 2 diabetes mellitus with other specified complication: Secondary | ICD-10-CM

## 2021-09-15 DIAGNOSIS — Z23 Encounter for immunization: Secondary | ICD-10-CM | POA: Diagnosis not present

## 2021-09-15 DIAGNOSIS — E785 Hyperlipidemia, unspecified: Secondary | ICD-10-CM

## 2021-09-15 DIAGNOSIS — Z0000 Encounter for general adult medical examination without abnormal findings: Secondary | ICD-10-CM

## 2021-09-15 MED ORDER — SHINGRIX 50 MCG/0.5ML IM SUSR: 0.5000 mL | Freq: Once | INTRAMUSCULAR | 1 refills | Status: AC

## 2021-09-15 NOTE — Patient Instructions (Signed)
Kathy Price 64 Years and Older, Female Preventive care refers to lifestyle choices and visits with your health care provider that can promote health and wellness. Preventive care visits are also called wellness exams. What can I expect for my preventive care visit? Counseling Your health care provider may ask you questions about your: Medical history, including: Past medical problems. Family medical history. Pregnancy and menstrual history. History of falls. Current health, including: Memory and ability to understand (cognition). Emotional well-being. Home life and relationship well-being. Sexual activity and sexual health. Lifestyle, including: Alcohol, nicotine or tobacco, and drug use. Access to firearms. Diet, exercise, and sleep habits. Work and work Statistician. Sunscreen use. Safety issues such as seatbelt and bike helmet use. Physical exam Your health care provider will check your: Height and weight. These may be used to calculate your BMI (body mass index). BMI is a measurement that tells if you are at a healthy weight. Waist circumference. This measures the distance around your waistline. This measurement also tells if you are at a healthy weight and may help predict your risk of certain diseases, such as type 2 diabetes and high blood pressure. Heart rate and blood pressure. Body temperature. Skin for abnormal spots. What immunizations do I need? Vaccines are usually given at various ages, according to a schedule. Your health care provider will recommend vaccines for you based on your age, medical history, and lifestyle or other factors, such as travel or where you work. What tests do I need? Screening Your health care provider may recommend screening tests for certain conditions. This may include: Lipid and cholesterol levels. Hepatitis C test. Hepatitis B test. HIV (human immunodeficiency virus) test. STI (sexually transmitted infection) testing, if  you are at risk. Lung cancer screening. Colorectal cancer screening. Diabetes screening. This is done by checking your blood sugar (glucose) after you have not eaten for a while (fasting). Mammogram. Talk with your health care provider about how often you should have regular mammograms. BRCA-related cancer screening. This may be done if you have a family history of breast, ovarian, tubal, or peritoneal cancers. Bone density scan. This is done to screen for osteoporosis. Talk with your health care provider about your test results, treatment options, and if necessary, the need for more tests. Follow these instructions at home: Eating and drinking  Eat a diet that includes fresh fruits and vegetables, whole grains, lean protein, and low-fat dairy products. Limit your intake of foods with high amounts of sugar, saturated fats, and salt. Take vitamin and mineral supplements as recommended by your health care provider. Do not drink alcohol if your health care provider tells you not to drink. If you drink alcohol: Limit how much you have to 0-1 drink a day. Know how much alcohol is in your drink. In the [place] one drink equals one 12 oz bottle of beer (355 mL), one 5 oz glass of wine (148 mL), or one 1 oz glass of hard liquor (44 mL). Lifestyle Brush your teeth every morning and night with fluoride toothpaste. Floss one time each day. Exercise for at least 30 minutes 5 or more days each week. Do not use any products that contain nicotine or tobacco. These products include cigarettes, chewing tobacco, and vaping devices, such as e-cigarettes. If you need help quitting, ask your health care provider. Do not use drugs. If you are sexually active, practice safe sex. Use a condom or other form of protection in order to prevent STIs. Take aspirin only as  told by your health care provider. Make sure that you understand how much to take and what form to take. Work with your health care provider to find out  whether it is safe and beneficial for you to take aspirin daily. Ask your health care provider if you need to take a cholesterol-lowering medicine (statin). Find healthy ways to manage stress, such as: Meditation, yoga, or listening to music. Journaling. Talking to a trusted person. Spending time with friends and family. Minimize exposure to UV radiation to reduce your risk of skin cancer. Safety Always wear your seat belt while driving or riding in a vehicle. Do not drive: If you have been drinking alcohol. Do not ride with someone who has been drinking. When you are tired or distracted. While texting. If you have been using any mind-altering substances or drugs. Wear a helmet and other protective equipment during sports activities. If you have firearms in your house, make sure you follow all gun safety procedures. What's next? Visit your health care provider once a year for an annual wellness visit. Ask your health care provider how often you should have your eyes and teeth checked. Stay up to date on all vaccines. This information is not intended to replace advice given to you by your health care provider. Make sure you discuss any questions you have with your health care provider. Document Revised: 04/12/2021 Document Reviewed: 04/12/2021 Elsevier Patient Education  [place].

## 2021-09-16 NOTE — Telephone Encounter (Signed)
Requested Prescriptions  Pending Prescriptions Disp Refills  . atorvastatin (LIPITOR) 40 MG tablet [Pharmacy Med Name: ATORVASTATIN 40 MG TABLET] 90 tablet 1    Sig: TAKE 1 TABLET BY MOUTH EVERY DAY     Cardiovascular:  Antilipid - Statins Passed - 09/15/2021  5:51 PM      Passed - Total Cholesterol in normal range and within 360 days    Cholesterol  Date Value Ref Range Status  08/14/2021 152 <200 mg/dL Final         Passed - LDL in normal range and within 360 days    LDL Cholesterol (Calc)  Date Value Ref Range Status  08/14/2021 76 mg/dL (calc) Final    Comment:    Reference range: <100 . Desirable range <100 mg/dL for primary prevention;   <70 mg/dL for patients with CHD or diabetic patients  with > or = 2 CHD risk factors. Marland Kitchen LDL-C is now calculated using the Martin-Hopkins  calculation, which is a validated novel method providing  better accuracy than the Friedewald equation in the  estimation of LDL-C.  Horald Pollen et al. Lenox Ahr. 4403;474(25): 2061-2068  (http://education.Quest[employer]ostics.com/faq/FAQ164)          Passed - HDL in normal range and within 360 days    HDL  Date Value Ref Range Status  08/14/2021 53 > OR = 50 mg/dL Final         Passed - Triglycerides in normal range and within 360 days    Triglycerides  Date Value Ref Range Status  08/14/2021 145 <150 mg/dL Final         Passed - Patient is not pregnant      Passed - Valid encounter within last 12 months    Recent Outpatient Visits          Yesterday Well adult exam   [facility] Alba Cory, MD   1 month ago Diabetes mellitus type 2 in obese [facility])   [facility] Alba Cory, MD   7 months ago Diabetes mellitus type 2 in obese [facility])   [facility] Alba Cory, MD   10 months ago Ingrown toenail with infection   [facility] [place], Danna Hefty, MD   1 year ago Diabetes mellitus type 2 in obese [facility])   [facility]  [facility] Alba Cory, MD      Future Appointments            In 5 months Carlynn Purl, Danna Hefty, MD [facility]   In 6 months  [facility], [facility]

## 2021-10-11 ENCOUNTER — Telehealth: Payer: Self-pay

## 2021-10-11 NOTE — Progress Notes (Addendum)
° ° °  Chronic Care Management Pharmacy Assistant   Name: Kathy Price  MRN: [id number] DOB: April 30, 1956  Patient Assistant 2023 Renewal Application for Jardiance  I received a task from Angelena Sole, CPP regarding application for the renewal for the patient assistant application for patient's Jardiance. Patient currently receive her Jardiance through [facility]. Patient turned in her part of her 2023 application to the CPP, and he needs for me to complete the providers portion of the application and e-mail it to him so he can fax all paperwork over to [facility] @ [phone] for processing.  I completed the providers portion of the application, and e-mailed it to CPP for providers signature. All excel sheets updated. Once documents have been faxed over to [facility] Cares CPP will let me know and I will follow up with [employer] for status of 2023 application.   Medications: Outpatient Encounter Medications as of 10/11/2021  Medication Sig   aspirin EC 81 MG tablet Take 1 tablet by mouth daily.   atorvastatin (LIPITOR) 40 MG tablet TAKE 1 TABLET BY MOUTH EVERY DAY   carvedilol (COREG) 3.125 MG tablet Take 1 tablet by mouth 2 (two) times a day.   Cholecalciferol (VITAMIN D3) 2000 UNITS capsule Take 1 capsule by mouth daily.   empagliflozin (JARDIANCE) 25 MG TABS tablet Take 1 tablet (25 mg total) by mouth daily.   Multiple Vitamins-Minerals (WOMENS MULTIVITAMIN PO) Take 1 tablet by mouth.    sacubitril-valsartan (ENTRESTO) 24-26 MG Take 1 tablet by mouth daily.   spironolactone (ALDACTONE) 25 MG tablet Take by mouth.   No facility-administered encounter medications on file as of 10/11/2021.    Adelene Idler, CPA/CMA Clinical Pharmacist Assistant Phone: [phone]   [employer] Patient Assistance form for Jardiance completed by patient and faxed for review on 10/11/21

## 2021-11-01 ENCOUNTER — Telehealth: Payer: Self-pay

## 2021-11-01 NOTE — Progress Notes (Signed)
Chronic Care Management Pharmacy Assistant   Name: Kathy Price  MRN: [id number] DOB: 1956-02-16  Patient Assistance 2023 Renewal Application  I received a message from Kathy Price, CPP stating that he received a message from Kathy Price regarding the patient's 2023 renewal application for her Jardiance. According to Kathy Price the patient's applications is missing her address, phone number and proof of income. Per CPP he re-faxed patient's proof of income to Kathy Price today, but has requested that I call them to see if they will take a her address, and phone number verbally over the phone. Per CPP the application that the patient turned in did not have her address nor her phone number.  I reached out to Kathy Price, and spoke with a representative who advised that they are missing the entire page one of the patient's application, and it would need to be faxed to them in order for the to process the application. I have sent a message to the CPP requesting that he fax that page to them, and I will follow-up next week to make sure they received the fax.  I contacted the patient to update her, and let her know that page one of her application was missing. I informed her that I completed it and all I need for her to do is stop by Kathy Price and sign the missing page. I advised her that it will be waiting for her at the front desk. Patient stated that she will stop by in the morning and sign. CPP has been notified.  11/15/2021  Spoke with the patient, and she reports on 01/05 she came up to the clinic to sign the application paperwork for refaxing . According to the notes the CPP refaxed application on 11/08/2021. Patient reports that she received another letter regarding missing information. I informed her I would reach out to [facility] to make sure the received the fax, and to make sure there was nothing else missing as when I spoke with the representative on 01/04 she only stated the application was  missing address, phone number and proof of income which the patient stated she completed all on the 4th.  I reached out to Kathy Price, and spoke with a representative who advised that they received the patient's information. It did take him sometime to find the paperwork as someone misspelled the patient's name and it got attached to a new profile. After the representative researched, and found the information he was able to update the patient's profile correctly, and move all paperwork to that profile. He also took a few minutes to look over all issued paperwork to make sure there was nothing additional missing. After a few minutes the representative advised that so far it appears they have everything they need, but he will not know for sure until they get all her paperwork under the right profile for her. The representative advised that he was taken care of this, and [facility] should receive a fax with an update today no later than tomorrow. He stated if I do not receive the fax by in the morning to call them back.   11/28/2021  Contacted Kathy Price to get the status update of the patient's application. I spoke with the representative who confirmed that the patient has been enrolled into the program as 11/16/2019 and her Kathy Price has shipped and was delivered to her last Thursday.  I contacted patient to confirm that she did receive her shipment of Jardiance, and she confirmed  she did. No other issues or concerns today.  Medications: Outpatient Encounter Medications as of 11/01/2021  Medication Sig   aspirin EC 81 MG tablet Take 1 tablet by mouth daily.   atorvastatin (LIPITOR) 40 MG tablet TAKE 1 TABLET BY MOUTH EVERY DAY   carvedilol (COREG) 3.125 MG tablet Take 1 tablet by mouth 2 (two) times a day.   Cholecalciferol (VITAMIN D3) 2000 UNITS capsule Take 1 capsule by mouth daily.   empagliflozin (JARDIANCE) 25 MG TABS tablet Take 1 tablet (25 mg total) by mouth daily.   Multiple Vitamins-Minerals (WOMENS  MULTIVITAMIN PO) Take 1 tablet by mouth.    sacubitril-valsartan (ENTRESTO) 24-26 MG Take 1 tablet by mouth daily.   spironolactone (ALDACTONE) 25 MG tablet Take by mouth.   No facility-administered encounter medications on file as of 11/01/2021.   Adelene Idler, CPA/CMA Clinical Pharmacist Assistant Phone: [phone]

## 2021-11-08 NOTE — Progress Notes (Signed)
Name: Kathy Price   MRN: [id number]    DOB: 03-02-1956   Date:11/09/2021       Progress Note  Subjective  Chief Complaint  Shoulder Pain  HPI  Right Shoulder pain: symptoms started gradually a few weeks ago. Initially it was sore with periods of sharp pain with shoulder abduction, got progressively worse a couple of weeks ago that it was difficult to even open her arm slightly. She has been applied topical otc medication and Tylenol  and has improved. She is now able to abduct up to 80-90 degrees with mild pain. No redness but seems swollen and tight. No neck pain. Denies injury that she can recall. No previous episodes of shoulder pain.   DMII: it has been controlled , explained steroids will cause glucose to spike. She has CHF and discussed risk of NSAID's and to monitor for swelling   Patient Active Problem List   Diagnosis Date Noted   Helicobacter pylori gastritis 11/06/2016   Cardiomyopathy, ischemic 01/30/2016   CAD S/P percutaneous coronary angioplasty 09/07/2015   Diabetes mellitus type 2 in obese ([place]) 09/07/2015   Dyslipidemia 09/07/2015   H/O: hysterectomy 09/07/2015   H/O acute myocardial infarction 09/07/2015   Vitamin D deficiency 09/07/2015   Morbid obesity ([place]) 09/07/2015   Bradycardia 05/13/2014   Arteriosclerosis of coronary artery 05/13/2014   Essential (primary) hypertension 05/13/2014   UARS (upper airway resistance syndrome) 01/23/2013    Past Surgical History:  Procedure Laterality Date   ABDOMINAL HYSTERECTOMY     COLONOSCOPY     COLONOSCOPY WITH PROPOFOL N/A 05/09/2020   Procedure: COLONOSCOPY WITH PROPOFOL;  Surgeon: Jonathon Bellows, MD;  Location: [facility] ENDOSCOPY;  Service: Gastroenterology;  Laterality: N/A;   CORONARY ANGIOPLASTY  2002   stents   ESOPHAGOGASTRODUODENOSCOPY (EGD) WITH PROPOFOL N/A 11/05/2016   Procedure: ESOPHAGOGASTRODUODENOSCOPY (EGD) WITH PROPOFOL;  Surgeon: Jonathon Bellows, MD;  Location: [facility] ENDOSCOPY;  Service: Endoscopy;   Laterality: N/A;    Family History  Problem Relation Age of Onset   Diabetes Mother    Hypertension Mother    Cancer Father    CAD Brother    Breast cancer Sister 42    Social History   Tobacco Use   Smoking status: Former    Packs/day: 1.00    Years: 25.00    Pack years: 25.00    Types: Cigarettes    Quit date: 09/06/2000    Years since quitting: 21.1    Passive exposure: Current (husband smokes inside)   Smokeless tobacco: Never   Tobacco comments:    Smoking cessation materials not required  Substance Use Topics   Alcohol use: No    Alcohol/week: 0.0 standard drinks     Current Outpatient Medications:    aspirin EC 81 MG tablet, Take 1 tablet by mouth daily., Disp: , Rfl:    atorvastatin (LIPITOR) 40 MG tablet, TAKE 1 TABLET BY MOUTH EVERY DAY, Disp: 90 tablet, Rfl: 1   Cholecalciferol (VITAMIN D3) 2000 UNITS capsule, Take 1 capsule by mouth daily., Disp: , Rfl:    empagliflozin (JARDIANCE) 25 MG TABS tablet, Take 1 tablet (25 mg total) by mouth daily., Disp: 90 tablet, Rfl: 3   Multiple Vitamins-Minerals (WOMENS MULTIVITAMIN PO), Take 1 tablet by mouth. , Disp: , Rfl:    sacubitril-valsartan (ENTRESTO) 24-26 MG, Take 1 tablet by mouth daily., Disp: , Rfl:    carvedilol (COREG) 3.125 MG tablet, Take 1 tablet by mouth 2 (two) times a day., Disp: , Rfl:  spironolactone (ALDACTONE) 25 MG tablet, Take by mouth., Disp: , Rfl:   Allergies  Allergen Reactions   Metoprolol Other (See Comments)    severe bradycardia   Pantoprazole Rash   Sulfa Antibiotics Rash and Hives    I personally reviewed active problem list, medication list, allergies, family history, social history, health maintenance with the patient/caregiver today.   ROS  Ten systems reviewed and is negative except as mentioned in HPI   Objective  Vitals:   11/09/21 0943  BP: 124/72  Pulse: 89  Resp: 16  Temp: 98.2 F (36.8 C)  SpO2: 99%  Weight: 242 lb (109.8 kg)  Height: 5' 2"  (1.575 m)     Body mass index is 44.26 kg/m.  Physical Exam  Constitutional: Patient appears well-developed and well-nourished. Obese  No distress.  HEENT: head atraumatic, normocephalic, pupils equal and reactive to light, neck supple, throat within normal limits Cardiovascular: Normal rate, regular rhythm and normal heart sounds.  No murmur heard. No BLE edema. Pulmonary/Chest: Effort normal and breath sounds normal. No respiratory distress. Abdominal: Soft.  There is no tenderness. Psychiatric: Patient has a normal mood and affect. behavior is normal. Judgment and thought content normal  Recent Results (from the past 2160 hour(s))  POCT HgB A1C     Status: Abnormal   Collection Time: 08/14/21  9:06 AM  Result Value Ref Range   Hemoglobin A1C 6.3 (A) 4.0 - 5.6 %   HbA1c POC (<> result, manual entry)     HbA1c, POC (prediabetic range)     HbA1c, POC (controlled diabetic range)    Lipid panel     Status: None   Collection Time: 08/14/21  9:41 AM  Result Value Ref Range   Cholesterol 152 <200 mg/dL   HDL 53 > OR = 50 mg/dL   Triglycerides 145 <150 mg/dL   LDL Cholesterol (Calc) 76 mg/dL (calc)    Comment: Reference range: <100 . Desirable range <100 mg/dL for primary prevention;   <70 mg/dL for patients with CHD or diabetic patients  with > or = 2 CHD risk factors. Marland Kitchen LDL-C is now calculated using the Martin-Hopkins  calculation, which is a validated novel method providing  better accuracy than the Friedewald equation in the  estimation of LDL-C.  Cresenciano Genre et al. Annamaria Helling. 3474;259(56): 2061-2068  (http://education.[facility].com/faq/FAQ164)    Total CHOL/HDL Ratio 2.9 <5.0 (calc)   Non-HDL Cholesterol (Calc) 99 <130 mg/dL (calc)    Comment: For patients with diabetes plus 1 major ASCVD risk  factor, treating to a non-HDL-C goal of <100 mg/dL  (LDL-C of <70 mg/dL) is considered a therapeutic  option.   COMPLETE METABOLIC PANEL WITH GFR     Status: Abnormal   Collection Time:  08/14/21  9:41 AM  Result Value Ref Range   Glucose, Bld 109 (H) 65 - 99 mg/dL    Comment: .            Fasting reference interval . For someone without known diabetes, a glucose value between 100 and 125 mg/dL is consistent with prediabetes and should be confirmed with a follow-up test. .    BUN 17 7 - 25 mg/dL   Creat 1.03 0.50 - 1.05 mg/dL   eGFR 60 > OR = 60 mL/min/1.45m    Comment: The eGFR is based on the CKD-EPI 2021 equation. To calculate  the new eGFR from a previous Creatinine or Cystatin C result, go to https://www.kidney.org/professionals/ kdoqi/gfr%5Fcalculator    BUN/Creatinine Ratio NOT APPLICABLE 6 -  22 (calc)   Sodium 140 135 - 146 mmol/L   Potassium 4.2 3.5 - 5.3 mmol/L   Chloride 106 98 - 110 mmol/L   CO2 23 20 - 32 mmol/L   Calcium 10.5 (H) 8.6 - 10.4 mg/dL   Total Protein 7.3 6.1 - 8.1 g/dL   Albumin 4.5 3.6 - 5.1 g/dL   Globulin 2.8 1.9 - 3.7 g/dL (calc)   AG Ratio 1.6 1.0 - 2.5 (calc)   Total Bilirubin 0.4 0.2 - 1.2 mg/dL   Alkaline phosphatase (APISO) 80 37 - 153 U/L   AST 12 10 - 35 U/L   ALT 9 6 - 29 U/L  CBC with Differential/Platelet     Status: None   Collection Time: 08/14/21  9:41 AM  Result Value Ref Range   WBC 6.4 3.8 - 10.8 Thousand/uL   RBC 4.72 3.80 - 5.10 Million/uL   Hemoglobin 13.3 11.7 - 15.5 g/dL   HCT 40.0 35.0 - 45.0 %   MCV 84.7 80.0 - 100.0 fL   MCH 28.2 27.0 - 33.0 pg   MCHC 33.3 32.0 - 36.0 g/dL   RDW 14.4 11.0 - 15.0 %   Platelets 258 140 - 400 Thousand/uL   MPV 10.6 7.5 - 12.5 fL   Neutro Abs 3,488 1,500 - 7,800 cells/uL   Lymphs Abs 2,362 850 - 3,900 cells/uL   Absolute Monocytes 326 200 - 950 cells/uL   Eosinophils Absolute 192 15 - 500 cells/uL   Basophils Absolute 32 0 - 200 cells/uL   Neutrophils Relative % 54.5 %   Total Lymphocyte 36.9 %   Monocytes Relative 5.1 %   Eosinophils Relative 3.0 %   Basophils Relative 0.5 %  Urine Microalbumin w/creat. ratio     Status: Abnormal   Collection Time: 08/14/21   9:41 AM  Result Value Ref Range   Creatinine, Urine 55 20 - 275 mg/dL   Microalb, Ur 1.7 mg/dL    Comment: Reference Range Not established    Microalb Creat Ratio 31 (H) <30 mcg/mg creat    Comment: . The ADA defines abnormalities in albumin excretion as follows: Marland Kitchen Albuminuria Category        Result (mcg/mg creatinine) . Normal to Mildly increased   <30 Moderately increased         30-299  Severely increased           > OR = 300 . The ADA recommends that at least two of three specimens collected within a 3-6 month period be abnormal before considering a patient to be within a diagnostic category.     PHQ2/9: Depression screen [facility] 2/9 11/09/2021 09/15/2021 08/14/2021 04/06/2021 02/02/2021  Decreased Interest 0 0 0 0 0  Down, Depressed, Hopeless 0 0 0 0 0  PHQ - 2 Score 0 0 0 0 0  Altered sleeping 0 0 0 - -  Tired, decreased energy 0 0 0 - -  Change in appetite 0 0 0 - -  Feeling bad or failure about yourself  0 0 0 - -  Trouble concentrating 0 0 0 - -  Moving slowly or fidgety/restless 0 0 0 - -  Suicidal thoughts 0 0 0 - -  PHQ-9 Score 0 0 0 - -  Difficult doing work/chores - Not difficult at all - - -  Some recent data might be hidden    phq 9 is negative   Fall Risk: Fall Risk  11/09/2021 09/15/2021 08/14/2021 04/06/2021 02/02/2021  Falls in the past  year? 0 0 0 0 0  Number falls in past yr: 0 0 0 0 0  Injury with Fall? 0 0 0 0 0  Risk for fall due to : No Fall Risks No Fall Risks No Fall Risks No Fall Risks -  Follow up Falls prevention discussed Falls prevention discussed Falls prevention discussed Falls prevention discussed -      Functional Status Survey: Is the patient deaf or have difficulty hearing?: No Does the patient have difficulty seeing, even when wearing glasses/contacts?: No Does the patient have difficulty concentrating, remembering, or making decisions?: No Does the patient have difficulty walking or climbing stairs?: No Does the patient have  difficulty dressing or bathing?: No Does the patient have difficulty doing errands alone such as visiting a doctor's office or shopping?: No    Assessment & Plan  1. Acute pain of right shoulder  - meloxicam (MOBIC) 7.5 MG tablet; Take 1 tablet (7.5 mg total) by mouth 2 (two) times daily.  Dispense: 30 tablet; Refill: 0   May continue tylenol , topical medication, use ice and call back if no resolution of symptoms

## 2021-11-09 ENCOUNTER — Encounter: Payer: Self-pay | Admitting: Family Medicine

## 2021-11-09 ENCOUNTER — Ambulatory Visit (INDEPENDENT_AMBULATORY_CARE_PROVIDER_SITE_OTHER): Payer: Medicare Other | Admitting: Family Medicine

## 2021-11-09 ENCOUNTER — Other Ambulatory Visit: Payer: Self-pay

## 2021-11-09 VITALS — BP 124/72 | HR 89 | Temp 98.2°F | Resp 16 | Ht 62.0 in | Wt 242.0 lb

## 2021-11-09 DIAGNOSIS — M25511 Pain in right shoulder: Secondary | ICD-10-CM

## 2021-11-09 MED ORDER — MELOXICAM 7.5 MG PO TABS: 7.5000 mg | ORAL_TABLET | Freq: Two times a day (BID) | ORAL | 0 refills | Status: DC

## 2021-11-26 ENCOUNTER — Other Ambulatory Visit: Payer: Self-pay | Admitting: Family Medicine

## 2021-11-26 DIAGNOSIS — M25511 Pain in right shoulder: Secondary | ICD-10-CM

## 2021-11-26 NOTE — Telephone Encounter (Signed)
Requested medication (s) are due for refill today - unsure  Requested medication (s) are on the active medication list -yes  Future visit scheduled -yes  Last refill: 11/09/21 #30  Notes to clinic: Request RF: Rx for acute visit- original Rx no RF- sent for review   Requested Prescriptions  Pending Prescriptions Disp Refills   meloxicam (MOBIC) 7.5 MG tablet [Pharmacy Med Name: MELOXICAM 7.5 MG TABLET] 30 tablet 0    Sig: TAKE 1 TABLET BY MOUTH 2 TIMES DAILY.     Analgesics:  COX2 Inhibitors Passed - 11/26/2021 12:51 AM      Passed - HGB in normal range and within 360 days    Hemoglobin  Date Value Ref Range Status  08/14/2021 13.3 11.7 - 15.5 g/dL Final   HGB  Date Value Ref Range Status  02/05/2015 12.2 12.0 - 16.0 g/dL Final          Passed - Cr in normal range and within 360 days    Creat  Date Value Ref Range Status  08/14/2021 1.03 0.50 - 1.05 mg/dL Final   Creatinine, Urine  Date Value Ref Range Status  08/14/2021 55 20 - 275 mg/dL Final          Passed - Patient is not pregnant      Passed - Valid encounter within last 12 months    Recent Outpatient Visits           2 weeks ago Acute pain of right shoulder   [facility] Alba Cory, MD   2 months ago Well adult exam   [facility] [place], Danna Hefty, MD   3 months ago Diabetes mellitus type 2 in obese [facility])   [facility] [place], Danna Hefty, MD   9 months ago Diabetes mellitus type 2 in obese [facility])   [facility] Alba Cory, MD   1 year ago Ingrown toenail with infection   [facility] Alba Cory, MD       Future Appointments             In 2 months Carlynn Purl, Danna Hefty, MD [facility]   In 4 months  [facility]               Requested Prescriptions  Pending Prescriptions Disp Refills   meloxicam (MOBIC) 7.5 MG tablet [Pharmacy Med Name:  MELOXICAM 7.5 MG TABLET] 30 tablet 0    Sig: TAKE 1 TABLET BY MOUTH 2 TIMES DAILY.     Analgesics:  COX2 Inhibitors Passed - 11/26/2021 12:51 AM      Passed - HGB in normal range and within 360 days    Hemoglobin  Date Value Ref Range Status  08/14/2021 13.3 11.7 - 15.5 g/dL Final   HGB  Date Value Ref Range Status  02/05/2015 12.2 12.0 - 16.0 g/dL Final          Passed - Cr in normal range and within 360 days    Creat  Date Value Ref Range Status  08/14/2021 1.03 0.50 - 1.05 mg/dL Final   Creatinine, Urine  Date Value Ref Range Status  08/14/2021 55 20 - 275 mg/dL Final          Passed - Patient is not pregnant      Passed - Valid encounter within last 12 months    Recent Outpatient Visits           2 weeks ago  Acute pain of right shoulder   [facility] Alba Cory, MD   2 months ago Well adult exam   [facility] Alba Cory, MD   3 months ago Diabetes mellitus type 2 in obese [facility])   [facility] Alba Cory, MD   9 months ago Diabetes mellitus type 2 in obese [facility])   [facility] Al[facility] Cory, MD   1 year ago Ingrown toenail with infection   [facility] Alba Cory, MD       Future Appointments             In 2 months Carlynn Purl, Danna Hefty, MD [facility]   In 4 months  [facility]

## 2021-12-12 ENCOUNTER — Telehealth: Payer: Self-pay

## 2021-12-12 NOTE — Progress Notes (Signed)
° ° °  Chronic Care Management Pharmacy Assistant   Name: Kathy Price  MRN: [id number] DOB: January 26, 1956  Patient called to be reminded of her telephone appointment with Angelena Sole, CPP on 12/13/2021 @ 1500  Patient aware of appointment date, time, and type of appointment (either telephone or in person). Patient aware to have/bring all medications, supplements, blood pressure and/or blood sugar logs to visit.  Questions: Are there any concerns you would like to discuss during your office visit? Nothing she can think of  Are you having any problems obtaining your medications? Not at this itme  If patient has any PAP medications ask if they are having any problems getting their PAP medication or refill? Patient was approved for the 2023 year for her Jardiance.   Star Rating Drug: Atorvastatin 40 mg last filled on 09/16/2021 for a 90-Day supply with [facility] Jardiance 25 mg patient receives this medication through [facility] patient assistance Sacubitril-Valsartan 24-26 mg last filled on 11/08/2020 for a 90-Day supply with [facility]  Any gaps in medications fill history? No  Care Gaps: Zoster Vaccine COVID-19 Booster 3  Adelene Idler, CPA/CMA Clinical Pharmacist Assistant Phone: [phone]

## 2021-12-13 ENCOUNTER — Ambulatory Visit (INDEPENDENT_AMBULATORY_CARE_PROVIDER_SITE_OTHER): Payer: Medicare Other

## 2021-12-13 DIAGNOSIS — I255 Ischemic cardiomyopathy: Secondary | ICD-10-CM

## 2021-12-13 DIAGNOSIS — E1169 Type 2 diabetes mellitus with other specified complication: Secondary | ICD-10-CM

## 2021-12-13 NOTE — Patient Instructions (Signed)
Visit Information It was great speaking with you today!  Please let me know if you have any questions about our visit.   Goals Addressed             This Visit's Progress    Monitor and Manage My Blood Sugar-Diabetes Type 2       Timeframe:  Long-Range Goal Priority:  High Start Date: 12/13/2021                            Expected End Date: 12/13/2022                      Follow Up within 90 days   -Check blood sugar daily before breakfast - check blood sugar if I feel it is too high or too low - enter blood sugar readings and medication or insulin into daily log - take the blood sugar log to all doctor visits    Why is this important?   Checking your blood sugar at home helps to keep it from getting very high or very low.  Writing the results in a diary or log helps the doctor know how to care for you.  Your blood sugar log should have the time, date and the results.  Also, write down the amount of insulin or other medicine that you take.  Other information, like what you ate, exercise done and how you were feeling, will also be helpful.     Notes:      Track and Manage Fluids and Swelling-Heart Failure       Timeframe:  Long-Range Goal Priority:  High Start Date:                             Expected End Date:                       Follow Up within 90 days   - call office if I gain more than 2 pounds in one day or 5 pounds in one week - use salt in moderation - watch for swelling in feet, ankles and legs every day - weigh myself daily    Why is this important?   It is important to check your weight daily and watch how much salt and liquids you have.  It will help you to manage your heart failure.    Notes:         Patient Care Plan: General Pharmacy (Adult)     Problem Identified: Hypertension, Hyperlipidemia, Diabetes, and Heart Failure   Priority: High     Long-Range Goal: Patient-Specific Goal   Start Date: 12/13/2021  Expected End Date: 12/13/2022   This Visit's Progress: On track  Priority: High  Note:   Current Barriers:  No barriers noted  Pharmacist Clinical Goal(s):  Patient will maintain control of diabetes as evidenced by A1c less than 7%  through collaboration with PharmD and provider.   Interventions: 1:1 collaboration with Steele Sizer, MD regarding development and update of comprehensive plan of care as evidenced by provider attestation and co-signature Inter-disciplinary care team collaboration (see longitudinal plan of care) Comprehensive medication review performed; medication list updated in electronic medical record  Diabetes (A1c goal <7%) -Controlled -Current medications: Jardiance 25 mg daily: Appropriate, Effective, Safe, Accessible  -Medications previously tried: NA  -Current home glucose readings fasting glucose: NA -Denies hypoglycemic/hyperglycemic symptoms -Given  microalbuminuria and eGFR of 60-90 mL/min, patient at moderate risk for CKD. Could consider Carrington Clamp for prevention of CKD. Patient preferred to wait at this time. -Recommended to continue current medication  Heart Failure (Goal: manage symptoms and prevent exacerbations) -Controlled -Last ejection fraction: 30-35% (Date: 2019) -HF type: Systolic -NYHA Class: II (slight limitation of activity) -Current treatment: Carvedilol 3.125 mg twice daily: Appropriate, Effective, Safe, Accessible Entresto 24-26 mg daily: Appropriate, Effective, Safe, Accessible  Spironolactone 1/2 tablet daily: Appropriate, Effective, Safe, Accessible  -Medications previously tried: NA  -Current home BP/HR readings: 117/79 (using smart watch)  -Current weight has been fluctuating, lost a couple of pounds over last few weeks. Does monitor weight daily and monitors for signs of swelling. -Recommended to continue current medication  Hyperlipidemia: (LDL goal < 70) -Uncontrolled -Current treatment: Atovastatin 40 mg daily: Appropriate, Effective, Safe, Accessible   -Medications previously tried: NA  -Wasn't taking atorvastatin every day due to concerns that it was worsening joint pain. Resumed taking daily after last labwork, and joint pain has not worsened.  -Recommended to continue current medication   Patient Goals/Self-Care Activities Patient will:  - check glucose daily, document, and provide at future appointments check blood pressure 2-3 times weekly, document, and provide at future appointments  Follow Up Plan: Telephone follow up appointment with care management team member scheduled for:  03/21/2022 at 8:30 AM      Patient agreed to services and verbal consent obtained.   The patient verbalized understanding of instructions, educational materials, and care plan provided today and declined offer to receive copy of patient instructions, educational materials, and care plan.   Malva Limes, [place] Pharmacist Practitioner  [facility] [phone]

## 2021-12-13 NOTE — Progress Notes (Signed)
Chronic Care Management Pharmacy Note  12/13/2021 Name:  Kathy Price MRN:  [id number] DOB:  [birth year]  Summary: Patient presents for CCM follow-up. Shoulder pain improved. PAPs for Ferne Coe approved by cardiology. Patient is at moderate risk of progression of CKD.   Recommendations/Changes made from today's visit: Continue current medications  Plan: CPP follow-up 3 months  Recommended Problem List Changes:   Modify:  Type 2 diabetes mellitus with microalbuminuria, without long-term current use of insulin  Chronic systolic (congestive) heart failure    Subjective: Kathy Price is an 66 y.o. year old female who is a primary patient of Steele Sizer, MD.  The CCM team was consulted for assistance with disease management and care coordination needs.    Engaged with patient by telephone for follow up visit in response to provider referral for pharmacy case management and/or care coordination services.   Consent to Services:  The patient was given information about Chronic Care Management services, agreed to services, and gave verbal consent prior to initiation of services.  Please see initial visit note for detailed documentation.   Patient Care Team: Steele Sizer, MD as PCP - General (Family Medicine) Germaine Pomfret, [facility] (Pharmacist) Corey Skains, MD as Consulting Physician (Cardiology)  Recent office visits: 11/09/21: Patient presented to Dr. Ancil Boozer for shoulder pain. Meloxicam.  09/15/21: Patient presented to Dr. Ancil Boozer for follow-up.  08/14/21: Patient presented to Dr. Ancil Boozer for follow-up.   Recent consult visits: 07/06/21: Patient presented to Dr. Nehemiah Massed (Cardiology) for follow-up.   Hospital visits: None in previous 6 months   Objective:  Lab Results  Component Value Date   CREATININE 1.03 08/14/2021   BUN 17 08/14/2021   EGFR 60 08/14/2021   GFRNONAA 64 02/16/2020   GFRAA 75 02/16/2020   NA 140 08/14/2021   K 4.2 08/14/2021    CALCIUM 10.5 (H) 08/14/2021   CO2 23 08/14/2021   GLUCOSE 109 (H) 08/14/2021    Lab Results  Component Value Date/Time   HGBA1C 6.3 (A) 08/14/2021 09:06 AM   HGBA1C 6.1 (A) 02/02/2021 09:55 AM   HGBA1C 6.2 02/01/2020 11:30 AM   HGBA1C 6.2 07/17/2019 08:47 AM   MICROALBUR 1.7 08/14/2021 09:41 AM   MICROALBUR 100 08/03/2020 10:33 AM   MICROALBUR 0.8 07/17/2019 12:00 AM   MICROALBUR 20 01/15/2018 12:34 PM    Last diabetic Eye exam:  Lab Results  Component Value Date/Time   HMDIABEYEEXA No Retinopathy 05/05/2021 12:00 AM    Last diabetic Foot exam: No results found for: HMDIABFOOTEX   Lab Results  Component Value Date   CHOL 152 08/14/2021   HDL 53 08/14/2021   LDLCALC 76 08/14/2021   TRIG 145 08/14/2021   CHOLHDL 2.9 08/14/2021    Hepatic Function Latest Ref Rng & Units 08/14/2021 02/16/2020 03/05/2019  Total Protein 6.1 - 8.1 g/dL 7.3 6.9 7.1  Albumin 3.5 - 5.0 g/dL - - -  AST 10 - 35 U/L _0 ALT 6 - 29 U/L _1 Alk Phosphatase 38 - 126 U/L - - -  Total Bilirubin 0.2 - 1.2 mg/dL 0.4 0.4 0.4    No results found for: TSH, FREET4  CBC Latest Ref Rng & Units 08/14/2021 02/16/2020 01/20/2018  WBC 3.8 - 10.8 Thousand/uL 6.4 6.9 6.5  Hemoglobin 11.7 - 15.5 g/dL 13.3 13.2 12.7  Hematocrit 35.0 - 45.0 % 40.0 40.2 37.0  Platelets 140 - 400 Thousand/uL 258 266 251    Lab Results  Component  Value Date/Time   VD25OH 38 01/20/2018 08:14 AM   VD25OH 25 (L) 09/25/2016 09:15 AM    Clinical ASCVD: No  The 10-year ASCVD risk score (Arnett DK, et al., 2019) is: 16.1%   Values used to calculate the score:     Age: 10 years     Sex: Female     Is Non-Hispanic African American: Yes     Diabetic: Yes     Tobacco smoker: No     Systolic Blood Pressure: 168 mmHg     Is BP treated: Yes     HDL Cholesterol: 53 mg/dL     Total Cholesterol: 152 mg/dL    Depression screen [facility] 2/9 11/09/2021 09/15/2021 08/14/2021  Decreased Interest 0 0 0  Down, Depressed, Hopeless 0 0 0   PHQ - 2 Score 0 0 0  Altered sleeping 0 0 0  Tired, decreased energy 0 0 0  Change in appetite 0 0 0  Feeling bad or failure about yourself  0 0 0  Trouble concentrating 0 0 0  Moving slowly or fidgety/restless 0 0 0  Suicidal thoughts 0 0 0  PHQ-9 Score 0 0 0  Difficult doing work/chores - Not difficult at all -  Some recent data might be hidden    Social History   Tobacco Use  Smoking Status Former   Packs/day: 1.00   Years: 25.00   Pack years: 25.00   Types: Cigarettes   Quit date: 09/06/2000   Years since quitting: 21.2   Passive exposure: Current (husband smokes inside)  Smokeless Tobacco Never  Tobacco Comments   Smoking cessation materials not required   BP Readings from Last 3 Encounters:  11/09/21 124/72  09/15/21 124/72  08/14/21 128/66   Pulse Readings from Last 3 Encounters:  11/09/21 89  09/15/21 86  08/14/21 91   Wt Readings from Last 3 Encounters:  11/09/21 242 lb (109.8 kg)  09/15/21 244 lb 1.6 oz (110.7 kg)  08/14/21 245 lb 6.4 oz (111.3 kg)   BMI Readings from Last 3 Encounters:  11/09/21 44.26 kg/m  09/15/21 44.65 kg/m  08/14/21 44.88 kg/m    Assessment/Interventions: Review of patient past medical history, allergies, medications, health status, including review of consultants reports, laboratory and other test data, was performed as part of comprehensive evaluation and provision of chronic care management services.   SDOH:  (Social Determinants of Health) assessments and interventions performed: Yes SDOH Interventions    Flowsheet Row Most Recent Value  SDOH Interventions   Financial Strain Interventions Intervention Not Indicated      SDOH Screenings   Alcohol Screen: Low Risk    Last Alcohol Screening Score (AUDIT): 0  Depression (PHQ2-9): Low Risk    PHQ-2 Score: 0  Financial Resource Strain: Low Risk    Difficulty of Paying Living Expenses: Not hard at all  Food Insecurity: No Food Insecurity   Worried About Ship broker in the Last Year: Never true   Ran Out of Food in the Last Year: Never true  Housing: Low Risk    Last Housing Risk Score: 0  Physical Activity: Insufficiently Active   Days of Exercise per Week: 2 days   Minutes of Exercise per Session: 30 min  Social Connections: Engineer, building services of Communication with Friends and Family: More than three times a week   Frequency of Social Gatherings with Friends and Family: More than three times a week   Attends Religious Services: 1 to 4  times per year   Active Member of Clubs or Organizations: Yes   Attends Archivist Meetings: More than 4 times per year   Marital Status: Married  Stress: No Stress Concern Present   Feeling of Stress : Not at all  Tobacco Use: Medium Risk   Smoking Tobacco Use: Former   Smokeless Tobacco Use: Never   Passive Exposure: Current  Transportation Needs: No Data processing manager (Medical): No   Lack of Transportation (Non-Medical): No    CCM Care Plan  Allergies  Allergen Reactions   Metoprolol Other (See Comments)    severe bradycardia   Pantoprazole Rash   Sulfa Antibiotics Rash and Hives    Medications Reviewed Today     Reviewed by Germaine Pomfret, [facility] (Pharmacist) on 12/13/21 at 1523  Med List Status: <None>   Medication Order Taking? Sig Documenting Provider Last Dose Status Informant  aspirin EC 81 MG tablet [id number]  Take 1 tablet by mouth daily. [provider]  Active Self           Med Note Windle Guard, JENNIFER L   Tue May 15, 2016 10:41 AM)    atorvastatin (LIPITOR) 40 MG tablet [id number]  TAKE 1 TABLET BY MOUTH EVERY DAY Steele Sizer, MD  Active   carvedilol (COREG) 3.125 MG tablet [id number] Yes Take 1 tablet by mouth 2 (two) times a day. Ralene Bathe, MD Taking Active   Cholecalciferol (VITAMIN D3) 2000 UNITS capsule [id number]  Take 1 capsule by mouth daily. [provider]  Active Self           Med Note  Windle Guard, JENNIFER L   Tue May 15, 2016 10:41 AM)    empagliflozin (JARDIANCE) 25 MG TABS tablet [id number]  Take 1 tablet (25 mg total) by mouth daily. Steele Sizer, MD  Active   meloxicam (MOBIC) 7.5 MG tablet [id number]  TAKE 1 TABLET BY MOUTH 2 TIMES DAILY. Steele Sizer, MD  Active   Multiple Vitamins-Minerals (WOMENS MULTIVITAMIN PO) [id number]  Take 1 tablet by mouth.  [provider]  Active Self  sacubitril-valsartan (ENTRESTO) 24-26 MG [id number] Yes Take 1 tablet by mouth 2 (two) times daily. Ralene Bathe, MD Taking Active   spironolactone (ALDACTONE) 25 MG tablet [id number] Yes Take 12.5 mg by mouth daily. [provider] Taking Active             Patient Active Problem List   Diagnosis Date Noted   Helicobacter pylori gastritis 11/06/2016   Cardiomyopathy, ischemic 01/30/2016   CAD S/P percutaneous coronary angioplasty 09/07/2015   Diabetes mellitus type 2 in obese ([place]) 09/07/2015   Dyslipidemia 09/07/2015   H/O: hysterectomy 09/07/2015   H/O acute myocardial infarction 09/07/2015   Vitamin D deficiency 09/07/2015   Morbid obesity ([place]) 09/07/2015   Bradycardia 05/13/2014   Arteriosclerosis of coronary artery 05/13/2014   Essential (primary) hypertension 05/13/2014   UARS (upper airway resistance syndrome) 01/23/2013    Immunization History  Administered Date(s) Administered   Fluad Quad(high Dose 65+) 08/03/2020, 08/14/2021   Influenza Inj Mdck Quad Pf 07/17/2019   Influenza, Seasonal, Injecte, Preservative Fre 07/29/2012, 06/29/2014   Influenza,inj,Quad PF,6+ Mos 09/29/2013, 06/28/2017, 08/28/2018   Influenza-Unspecified 06/29/2014, 08/25/2015, 08/16/2016   Moderna Sars-Covid-2 Vaccination 12/26/2019, 01/23/2020   PNEUMOCOCCAL CONJUGATE-20 09/15/2021   Pneumococcal Conjugate-13 04/07/2010, 04/05/2020   Pneumococcal Polysaccharide-23 04/07/2010, 12/30/2014, 09/07/2015   Td 04/07/2010   Tdap 04/07/2010, 11/15/2020   Zoster, Live  09/25/2016    Conditions to  be addressed/monitored:  Hypertension, Hyperlipidemia, Diabetes, and Heart Failure  Care Plan : General Pharmacy (Adult)  Updates made by Germaine Pomfret, RPH since 12/13/2021 12:00 AM     Problem: Hypertension, Hyperlipidemia, Diabetes, and Heart Failure   Priority: High     Long-Range Goal: Patient-Specific Goal   Start Date: 12/13/2021  Expected End Date: 12/13/2022  This Visit's Progress: On track  Priority: High  Note:   Current Barriers:  No barriers noted  Pharmacist Clinical Goal(s):  Patient will maintain control of diabetes as evidenced by A1c less than 7%  through collaboration with PharmD and provider.   Interventions: 1:1 collaboration with Steele Sizer, MD regarding development and update of comprehensive plan of care as evidenced by provider attestation and co-signature Inter-disciplinary care team collaboration (see longitudinal plan of care) Comprehensive medication review performed; medication list updated in electronic medical record  Diabetes (A1c goal <7%) -Controlled -Current medications: Jardiance 25 mg daily: Appropriate, Effective, Safe, Accessible  -Medications previously tried: NA  -Current home glucose readings fasting glucose: NA -Denies hypoglycemic/hyperglycemic symptoms -Given microalbuminuria and eGFR of 60-90 mL/min, patient at moderate risk for CKD. Could consider Carrington Clamp for prevention of CKD. Patient preferred to wait at this time. -Recommended to continue current medication  Heart Failure (Goal: manage symptoms and prevent exacerbations) -Controlled -Last ejection fraction: 30-35% (Date: 2019) -HF type: Systolic -NYHA Class: II (slight limitation of activity) -Current treatment: Carvedilol 3.125 mg twice daily: Appropriate, Effective, Safe, Accessible Entresto 24-26 mg daily: Appropriate, Effective, Safe, Accessible  Spironolactone 1/2 tablet daily: Appropriate, Effective, Safe, Accessible   -Medications previously tried: NA  -Current home BP/HR readings: 117/79 (using smart watch)  -Current weight has been fluctuating, lost a couple of pounds over last few weeks. Does monitor weight daily and monitors for signs of swelling. -Recommended to continue current medication  Hyperlipidemia: (LDL goal < 70) -Uncontrolled -Current treatment: Atovastatin 40 mg daily: Appropriate, Effective, Safe, Accessible  -Medications previously tried: NA  -Wasn't taking atorvastatin every day due to concerns that it was worsening joint pain. Resumed taking daily after last labwork, and joint pain has not worsened.  -Recommended to continue current medication   Patient Goals/Self-Care Activities Patient will:  - check glucose daily, document, and provide at future appointments check blood pressure 2-3 times weekly, document, and provide at future appointments  Follow Up Plan: Telephone follow up appointment with care management team member scheduled for:  03/21/2022 at 8:30 AM      Medication Assistance:  Jardiance obtained through [place] medication assistance program.  Enrollment ends Dec 2023  Entresto obtained through [employer] medication assistance program.  Enrollment ends Dec 2023  Compliance/Adherence/Medication fill history: Care Gaps: Zoster Vaccine COVID-19 Booster 3  Star-Rating Drugs: Atorvastatin 40 mg last filled on 09/16/2021 for a [facility]-Day supply [facility] Jardiance 25 mg patient receives this medication through [place] patient assistance Sacubitril-Valsartan 24-26 mg patient receives this medication through [employer] patient assistance    Patient[facility]  [id number] [place] [id number]: [phone]: [phone] [place] - [id number]: [phone]: [phone] Uses  pill box? Yes Pt endorses 100% compliance  We discussed: Current pharmacy is preferred with insurance plan and patient is satisfied with pharmacy services Patient decided to: Continue current medication management strategy  Care Plan and Follow Up Patient Decision:  Patient  agrees to Care Plan and Follow-up.  Plan: Telephone follow up appointment with care management team member scheduled for:  03/21/2022 at 8:30 AM  Malva Limes, [place] Pharmacist Practitioner  [facility] [phone]

## 2021-12-26 DIAGNOSIS — E669 Obesity, unspecified: Secondary | ICD-10-CM

## 2021-12-26 DIAGNOSIS — E785 Hyperlipidemia, unspecified: Secondary | ICD-10-CM

## 2021-12-26 DIAGNOSIS — I255 Ischemic cardiomyopathy: Secondary | ICD-10-CM | POA: Diagnosis not present

## 2021-12-26 DIAGNOSIS — E1169 Type 2 diabetes mellitus with other specified complication: Secondary | ICD-10-CM

## 2022-01-29 IMAGING — MG DIGITAL SCREENING BILAT W/ TOMO W/ CAD
6 of 12 series · 6 of 36 positions shown · non-contrast
Comparison: Previous exam(s).

CLINICAL DATA: Screening.

EXAM:
DIGITAL SCREENING BILATERAL MAMMOGRAM WITH TOMO AND CAD

[R MLO synth-2D (1 of 2)]
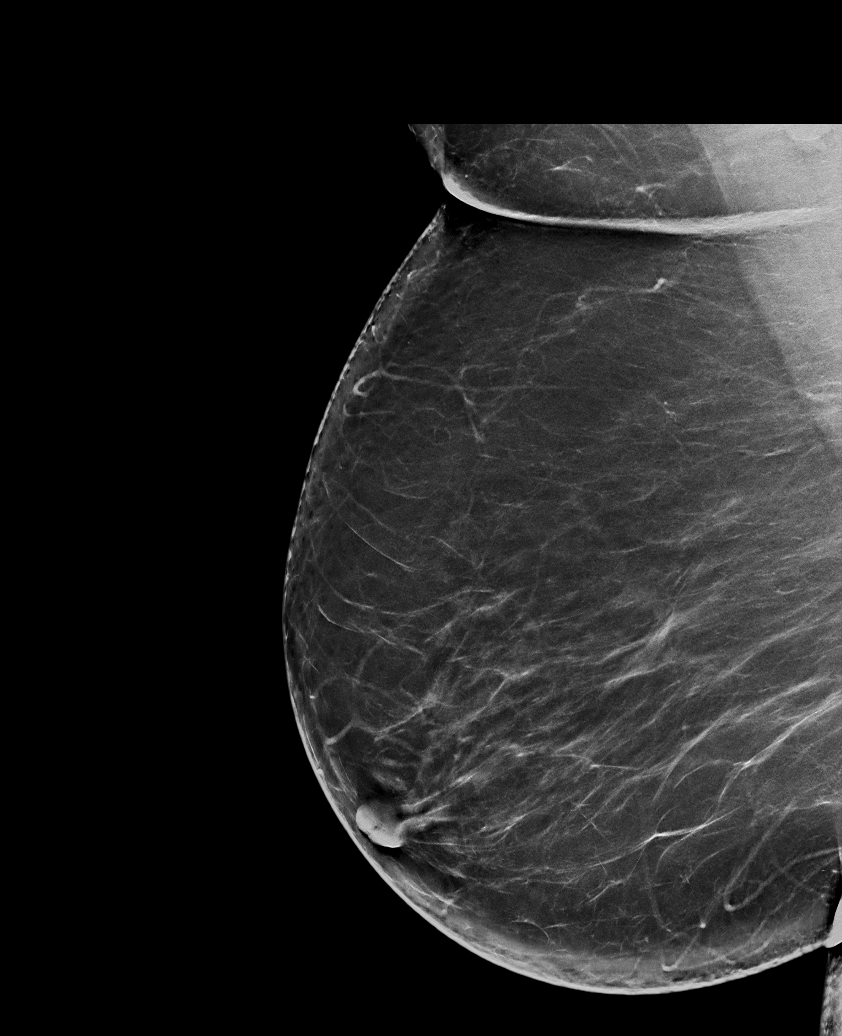

[L MLO synth-2D (1 of 2)]
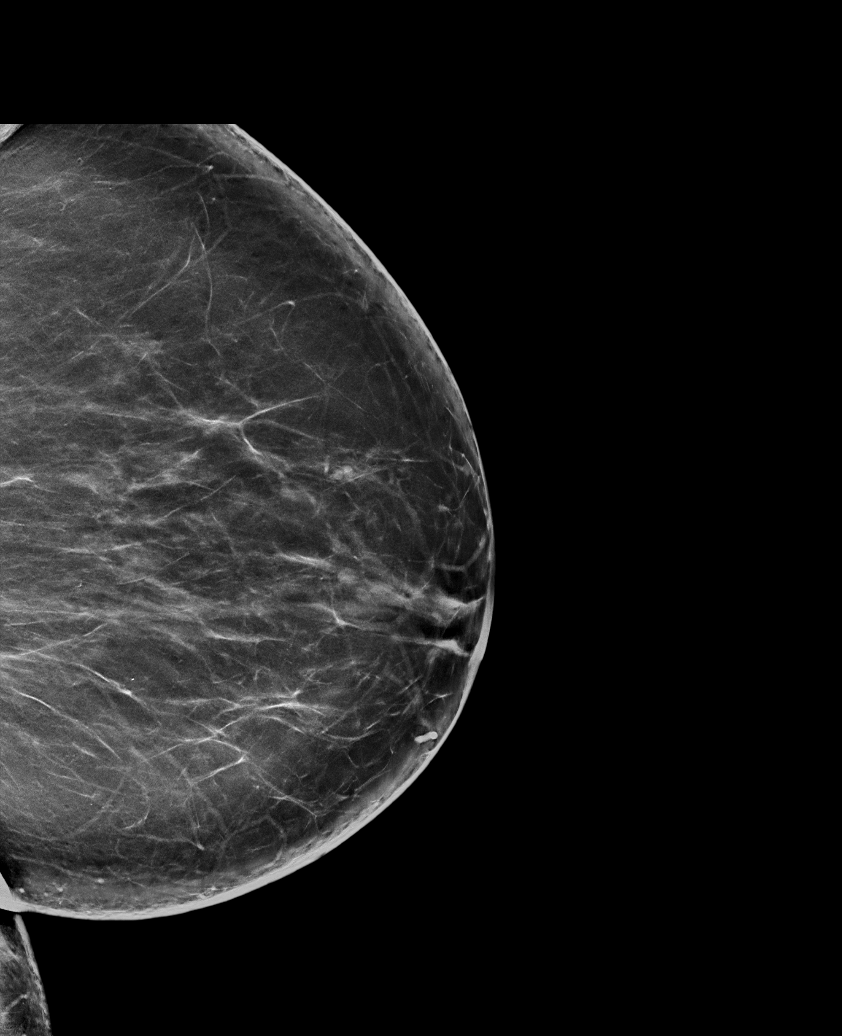

[R MLO synth-2D (2 of 2)]
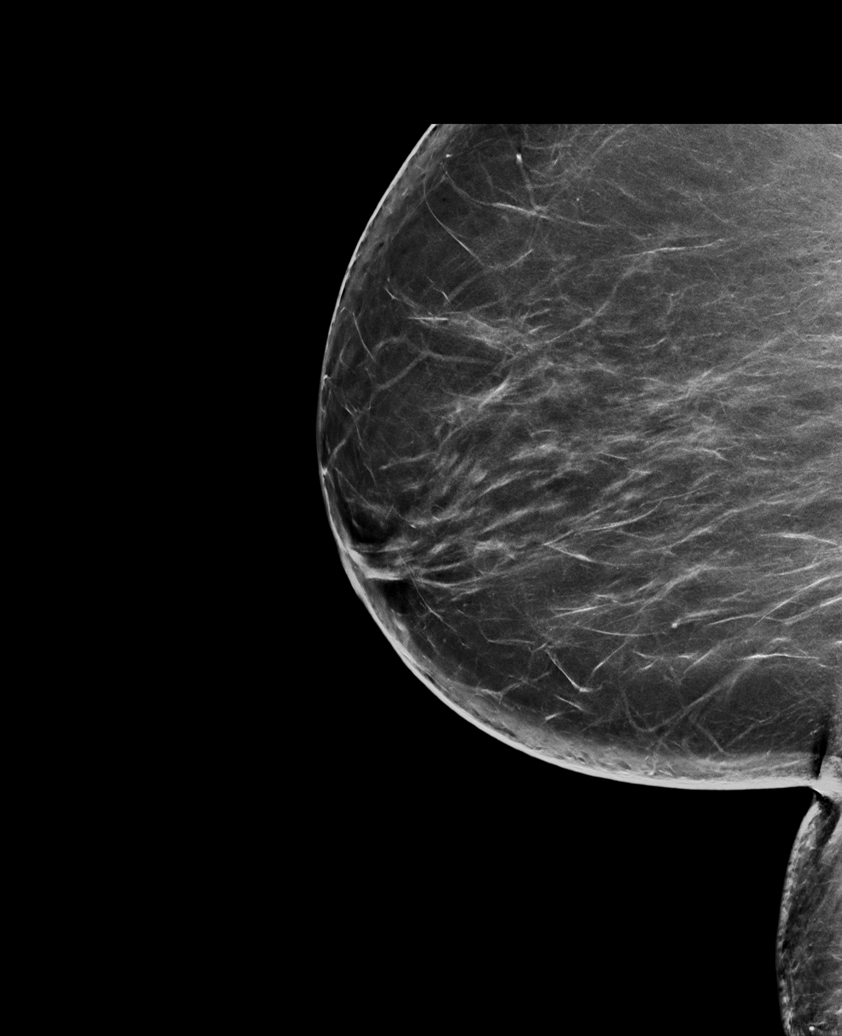

[R CC synth-2D]
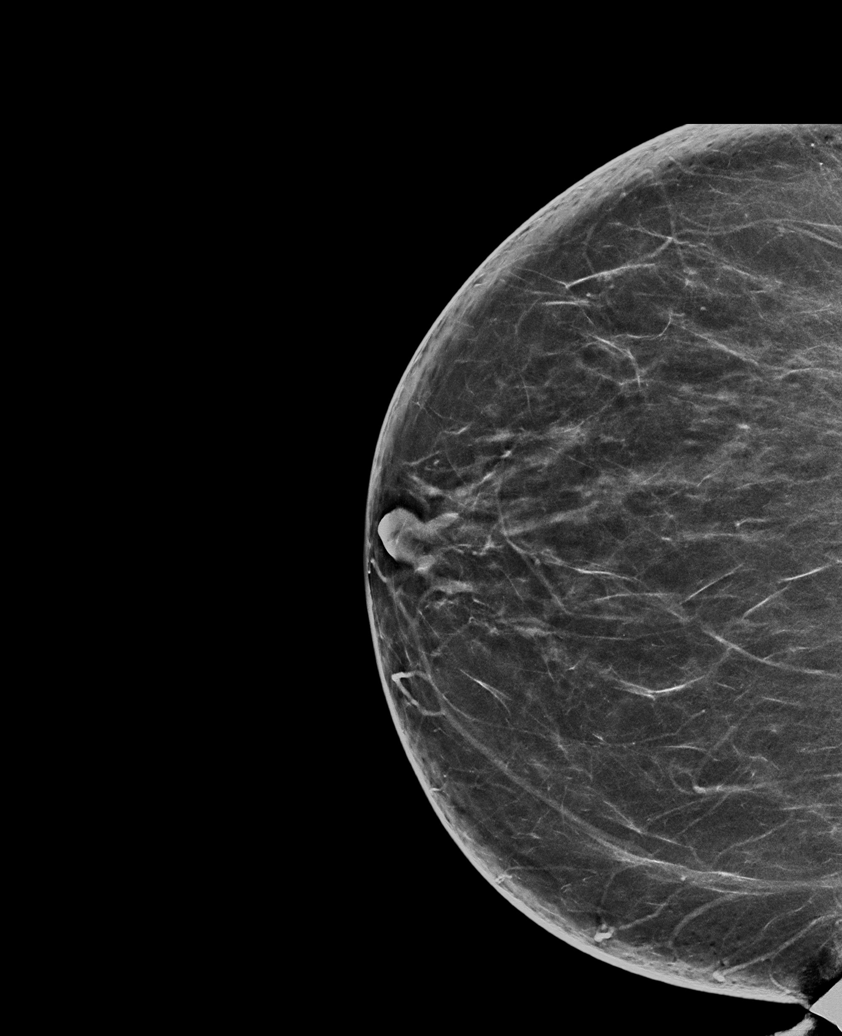

[L CC synth-2D]
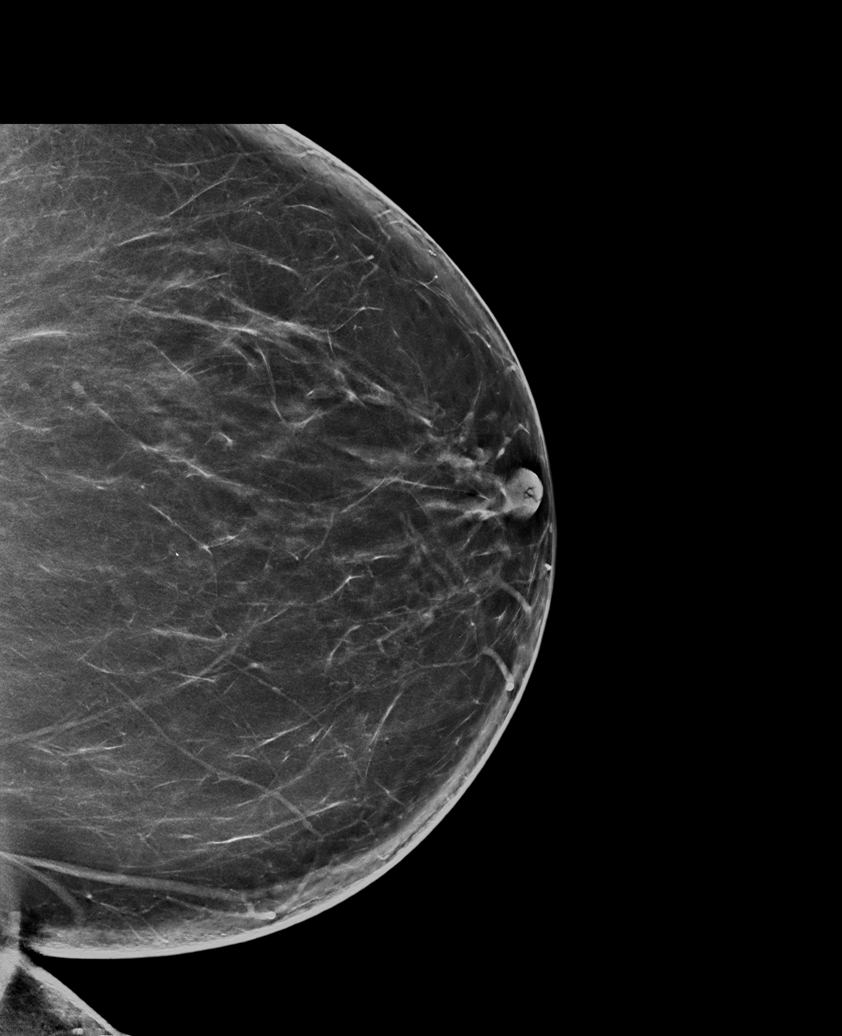

[L MLO synth-2D (2 of 2)]
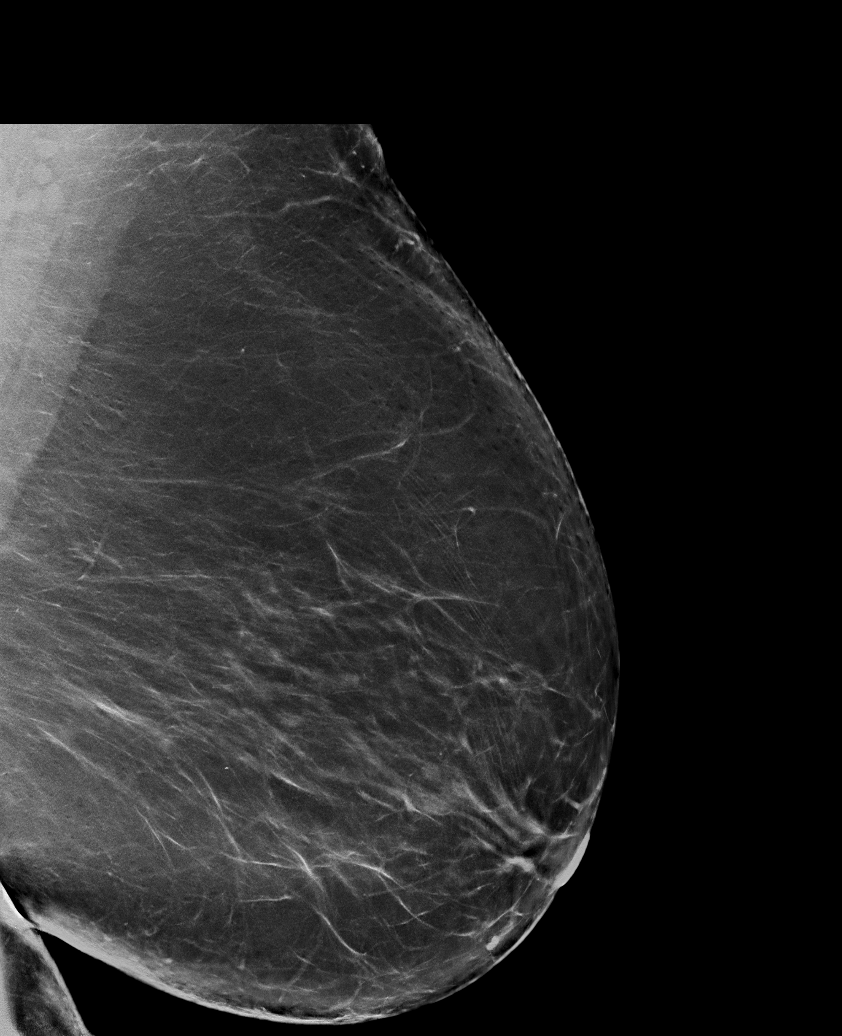

[6 of 36 positions shown; findings below may reference images not displayed]

ACR Breast Density Category b: There are scattered areas of
fibroglandular density.
FINDINGS: There are no findings suspicious for malignancy. Images were
processed with CAD.
IMPRESSION: No mammographic evidence of malignancy. A result letter of this
screening mammogram will be mailed directly to the patient.

RECOMMENDATION:
Screening mammogram in one year. (Code:[id number])

BI-RADS CATEGORY  1: Negative.

## 2022-02-12 NOTE — Progress Notes (Signed)
Name: Kathy Price   MRN: [id number]    DOB: Nov 24, 1955   Date:02/13/2022 ? ?     Progress Note ? ?Subjective ? ?Chief Complaint ? ?Follow Up ? ?HPI ? ?DMII: A1C has been at goal, last visit it was 6.3 % . She has not been checking her glucose lately. She denies polyphagia, polydipsia or polyuria. Urine micro was elevated at 31 and  she is on ARB. She is currently only taking Jardiance and denies hypoglycemic episode. No polyphagia, polydipsia or polyuria  ?  ?CAD: s/p MI and stent placement 08/2001,  sees  Dr. Gwen Pounds , last visit 06/2021  and she  has ischemic cardiomyopathy with left wall hypokinesis. Stress test only showed old lesion, no acute ischemia She has been taking apirin 81 mg daily, Entresto, spironolactone , carvedilol and atorvastatin. Last EKG at cardiologist showed frequent PVC's. She denies SOB ,diaphoresis, orthopnea or edema.  Last LDL not at goal., also has high triglycerides discussed switching to Rosuvastatin or increasing dose of Atorvastatin to 80 mg daily , however she states at the time she was not taking medication daily but has been compliant with Atorvastatin 40 mg daily since last labs.  ?  ?INTERPRETATION 08/2019  ?MOD LV SYSTOLIC DYSFUNCTION WITH AN ESTIMATED EF = 35-40 % ?NORMAL RIGHT VENTRICULAR SYSTOLIC FUNCTION ?MILD TRICUSPID AND MITRAL VALVE INSUFFICIENCY ?TRACE AORTIC VALVE INSUFFICIENCY ?NO VALVULAR STENOSIS ?MILD LV ENLARGEMENT ?MILD LA ENLARGEMENT ?  ?GERD:she had EGD done by Dr. Tobi Bastos back 10/2016 and was treated for h. Pylori gastritis. She is no  longer having indigestion or epigastric pain, she has heart burn very seldom usually triggered by diet, taking Tums prn only and less than once a week  ?  ?Morbid Obesity: she gained 20 lbs from  07/2018 until 2021, stopped working August 2019 and has not been as active, states also gained more since COVID. She lost another 2 lbs since last visit,  she states she has been riding her bike once or twice a week, for about 20  minutes, reminded her to increase frequency  ?  ?Hyperlipidemia: LDL was 76 back in October, but states she was not taking medication daily at that time, she has been compliant since , we will recheck labs next visit  ? ?Hypercalcemia: she had hypercalcemia and states not taking any calcium supplementation. She is willing to have labs recheck ? ?Deltoid Bursitis: she states pain going on for months, took meloxicam for a period of time and symptoms improved but still sore to touch and feels lumpy at times. No redness or increase in warmth ? ?Patient Active Problem List  ? Diagnosis Date Noted  ? Helicobacter pylori gastritis 11/06/2016  ? Cardiomyopathy, ischemic 01/30/2016  ? CAD S/P percutaneous coronary angioplasty 09/07/2015  ? Diabetes mellitus type 2 in obese (HCC) 09/07/2015  ? Dyslipidemia 09/07/2015  ? H/O: hysterectomy 09/07/2015  ? H/O acute myocardial infarction 09/07/2015  ? Vitamin D deficiency 09/07/2015  ? Morbid obesity (HCC) 09/07/2015  ? Bradycardia 05/13/2014  ? Arteriosclerosis of coronary artery 05/13/2014  ? Essential (primary) hypertension 05/13/2014  ? UARS (upper airway resistance syndrome) 01/23/2013  ? ? ?Past Surgical History:  ?Procedure Laterality Date  ? ABDOMINAL HYSTERECTOMY    ? COLONOSCOPY    ? COLONOSCOPY WITH PROPOFOL N/A 05/09/2020  ? Procedure: COLONOSCOPY WITH PROPOFOL;  Surgeon: Wyline Mood, MD;  Location: [facility] ENDOSCOPY;  Service: Gastroenterology;  Laterality: N/A;  ? CORONARY ANGIOPLASTY  2002  ? stents  ? ESOPHAGOGASTRODUODENOSCOPY (EGD) WITH PROPOFOL  N/A 11/05/2016  ? Procedure: ESOPHAGOGASTRODUODENOSCOPY (EGD) WITH PROPOFOL;  Surgeon: Wyline MoodKiran Anna, MD;  Location: [facility] ENDOSCOPY;  Service: Endoscopy;  Laterality: N/A;  ? ? ?Family History  ?Problem Relation Age of Onset  ? Diabetes Mother   ? Hypertension Mother   ? Cancer Father   ? CAD Brother   ? Breast cancer Sister [id number]  ? ? ?Social History  ? ?Tobacco Use  ? Smoking status: Former  ?  Packs/day: 1.00  ?  Years: 25.00  ?   Pack years: 25.00  ?  Types: Cigarettes  ?  Quit date: 09/06/2000  ?  Years since quitting: 21.4  ?  Passive exposure: Current (husband smokes inside)  ? Smokeless tobacco: Never  ? Tobacco comments:  ?  Smoking cessation materials not required  ?Substance Use Topics  ? Alcohol use: No  ?  Alcohol/week: 0.0 standard drinks  ? ? ? ?Current Outpatient Medications:  ?  aspirin EC 81 MG tablet, Take 1 tablet by mouth daily., Disp: , Rfl:  ?  Cholecalciferol (VITAMIN D3) 2000 UNITS capsule, Take 1 capsule by mouth daily., Disp: , Rfl:  ?  empagliflozin (JARDIANCE) 25 MG TABS tablet, Take 1 tablet (25 mg total) by mouth daily., Disp: 90 tablet, Rfl: 3 ?  Multiple Vitamins-Minerals (WOMENS MULTIVITAMIN PO), Take 1 tablet by mouth. , Disp: , Rfl:  ?  sacubitril-valsartan (ENTRESTO) 24-26 MG, Take 1 tablet by mouth 2 (two) times daily., Disp: , Rfl:  ?  atorvastatin (LIPITOR) 40 MG tablet, Take 1 tablet (40 mg total) by mouth daily., Disp: 90 tablet, Rfl: 1 ?  carvedilol (COREG) 3.125 MG tablet, Take 1 tablet by mouth 2 (two) times a day., Disp: , Rfl:  ?  spironolactone (ALDACTONE) 25 MG tablet, Take 12.5 mg by mouth daily., Disp: , Rfl:  ? ?Allergies  ?Allergen Reactions  ? Metoprolol Other (See Comments)  ?  severe bradycardia  ? Pantoprazole Rash  ? Sulfa Antibiotics Rash and Hives  ? ? ?I personally reviewed active problem list, medication list, allergies, family history, social history, health maintenance with the patient/caregiver today. ? ? ?ROS ? ?Constitutional: Negative for fever or weight change.  ?Respiratory: Negative for cough and shortness of breath.   ?Cardiovascular: Negative for chest pain or palpitations.  ?Gastrointestinal: Negative for abdominal pain, no bowel changes.  ?Musculoskeletal: Negative for gait problem or joint swelling.  ?Skin: Negative for rash.  ?Neurological: Negative for dizziness or headache.  ?No other specific complaints in a complete review of systems (except as listed in HPI above).   ? ?Objective ? ?Vitals:  ? 02/13/22 0838  ?BP: 118/74  ?Pulse: 87  ?Resp: 16  ?SpO2: 98%  ?Weight: 240 lb (108.9 kg)  ?Height: 5\' 2"  (1.575 m)  ? ? ?Body mass index is 43.9 kg/m?. ? ?Physical Exam ? ?Constitutional: Patient appears well-developed and well-nourished. Obese  No distress.  ?HEENT: head atraumatic, normocephalic, pupils equal and reactive to light,, neck supple, throat within normal limits ?Cardiovascular: Normal rate, regular rhythm and normal heart sounds.  No murmur heard. No BLE edema. ?Pulmonary/Chest: Effort normal and breath sounds normal. No respiratory distress. ?Abdominal: Soft.  There is no tenderness. ?Muscular Skeletal: tender to touch over deltoid bursa, the lumps are on both arms, likely fatty tissue and has a lipoma on left shoulder  ?Psychiatric: Patient has a normal mood and affect. behavior is normal. Judgment and thought content normal.  ? ?Diabetic Foot Exam: ?Diabetic Foot Exam - Simple   ?Simple Foot Form ?  Visual Inspection ?No deformities, no ulcerations, no other skin breakdown bilaterally: Yes ?Sensation Testing ?Intact to touch and monofilament testing bilaterally: Yes ?Pulse Check ?Posterior Tibialis and Dorsalis pulse intact bilaterally: Yes ?Comments ?  ? ? ? ?PHQ2/9: ? ?  02/13/2022  ?  8:38 AM 11/09/2021  ?  9:43 AM 09/15/2021  ?  8:49 AM 08/14/2021  ?  8:52 AM 04/06/2021  ?  8:37 AM  ?Depression screen PHQ 2/9  ?Decreased Interest 0 0 0 0 0  ?Down, Depressed, Hopeless 0 0 0 0 0  ?PHQ - 2 Score 0 0 0 0 0  ?Altered sleeping 0 0 0 0   ?Tired, decreased energy 0 0 0 0   ?Change in appetite 0 0 0 0   ?Feeling bad or failure about yourself  0 0 0 0   ?Trouble concentrating 0 0 0 0   ?Moving slowly or fidgety/restless 0 0 0 0   ?Suicidal thoughts 0 0 0 0   ?PHQ-9 Score 0 0 0 0   ?Difficult doing work/chores   Not difficult at all    ?  ?phq 9 is negative ? ? ?Fall Risk: ? ?  02/13/2022  ?  8:38 AM 11/09/2021  ?  9:42 AM 09/15/2021  ?  8:48 AM 08/14/2021  ?  8:52 AM 04/06/2021  ?  8:40  AM  ?Fall Risk   ?Falls in the past year? 0 0 0 0 0  ?Number falls in past yr: 0 0 0 0 0  ?Injury with Fall? 0 0 0 0 0  ?Risk for fall due to : No Fall Risks No Fall Risks No Fall Risks No Fall Risks No F

## 2022-02-13 ENCOUNTER — Ambulatory Visit (INDEPENDENT_AMBULATORY_CARE_PROVIDER_SITE_OTHER): Payer: Medicare Other | Admitting: Family Medicine

## 2022-02-13 ENCOUNTER — Telehealth: Payer: Self-pay

## 2022-02-13 ENCOUNTER — Encounter: Payer: Self-pay | Admitting: Family Medicine

## 2022-02-13 VITALS — BP 118/74 | HR 87 | Resp 16 | Ht 62.0 in | Wt 240.0 lb

## 2022-02-13 DIAGNOSIS — K219 Gastro-esophageal reflux disease without esophagitis: Secondary | ICD-10-CM

## 2022-02-13 DIAGNOSIS — E1169 Type 2 diabetes mellitus with other specified complication: Secondary | ICD-10-CM | POA: Diagnosis not present

## 2022-02-13 DIAGNOSIS — E669 Obesity, unspecified: Secondary | ICD-10-CM

## 2022-02-13 DIAGNOSIS — I2510 Atherosclerotic heart disease of native coronary artery without angina pectoris: Secondary | ICD-10-CM | POA: Diagnosis not present

## 2022-02-13 DIAGNOSIS — Z9861 Coronary angioplasty status: Secondary | ICD-10-CM

## 2022-02-13 DIAGNOSIS — I10 Essential (primary) hypertension: Secondary | ICD-10-CM

## 2022-02-13 DIAGNOSIS — E785 Hyperlipidemia, unspecified: Secondary | ICD-10-CM

## 2022-02-13 DIAGNOSIS — M7551 Bursitis of right shoulder: Secondary | ICD-10-CM

## 2022-02-13 DIAGNOSIS — I255 Ischemic cardiomyopathy: Secondary | ICD-10-CM | POA: Diagnosis not present

## 2022-02-13 DIAGNOSIS — E1129 Type 2 diabetes mellitus with other diabetic kidney complication: Secondary | ICD-10-CM

## 2022-02-13 DIAGNOSIS — R809 Proteinuria, unspecified: Secondary | ICD-10-CM

## 2022-02-13 MED ORDER — ATORVASTATIN CALCIUM 40 MG PO TABS: 40.0000 mg | ORAL_TABLET | Freq: Every day | ORAL | 1 refills | Status: DC

## 2022-02-13 NOTE — Telephone Encounter (Signed)
Pt stated that dr Carlynn Purl wanted her to return for lab work for [employer] check. But can not remember when

## 2022-02-13 NOTE — Telephone Encounter (Signed)
Called and left message relaying advice per Dr. Carlynn Purl ?

## 2022-02-13 NOTE — Patient Instructions (Signed)
Bursitis  Bursitis is inflammation and irritation of a bursa, which is a small fluid-filled sac that cushions and protects the joints. These sacs are located between bones and muscles, bones and muscle attachments, or bones and skin areas that are next to bones. A bursa protects those structures from the wear and tear that results from frequent movement. If the bursa becomes irritated, it can fill with extra fluid. Bursitis is most common near joints, especially the knees, elbows, hips, and shoulders. What are the causes? This condition may be caused by: An injury like a direct hit to a joint area, such as falling on your knee or elbow. Overuse of a joint (repetitive stress). Infection. This can happen if bacteria get into a bursa through a cut or scrape near a joint. Diseases that cause joint inflammation, such as gout and rheumatoid arthritis. What increases the risk? You are more likely to develop this condition if: You have a job or hobby that involves a lot of repetitive stress on your joints. You have a condition that weakens your body's defense system (immune system), such as diabetes, cancer, or HIV. You do any of these often: Lift and reach overhead. Kneel or lean on hard surfaces. Participate in physical activities that include repetitive motion, like running or walking. What are the signs or symptoms? Common symptoms of this condition include: Pain that gets worse when you move the affected body part or use it to support your body weight. Inflammation. Stiffness. Other symptoms include: Redness. Swelling. Tenderness. Warmth. Pain that continues after rest. Fever or chills. These may occur in bursitis that is caused by infection. How is this diagnosed? This condition may be diagnosed based on: Your medical history and a physical exam. Imaging tests, such as an MRI or X-ray. Draining fluid from the bursa to test it for infection or gout. Blood tests to rule out other causes  of inflammation. How is this treated? This condition can usually be treated at home with rest, ice, applying pressure (compression), and raising the body part that is affected (elevation). This is called RICE therapy. For mild bursitis, RICE therapy may be all you need. Other treatments may include: Over-the-counter medicines to relieve pain and inflammation. Injections of anti-inflammatory medicines. These medicines may be injected into and around the area of bursitis. Draining of fluid from the bursa to relieve pain and improve movement. Antibiotic medicine if there is an infection. Using a splint, brace, elastic wrap, pads, or walking aid, such as a cane. Physical or occupational therapy if you continue to have pain or limited movement. Your physical or occupational therapist can help determine what may have caused or contributed to the bursitis. This will help avoid further episodes. Surgery to remove a damaged or infected bursa. This may be needed if other treatments have not worked. Follow these instructions at home: Medicines Take over-the-counter and prescription medicines only as told by your health care provider. If you were prescribed an antibiotic medicine, take it as told by your health care provider. Do not stop using the antibiotic even if you start to feel better. Managing pain, stiffness, and swelling     Raise (elevate) the injured area above the level of your heart while you are sitting or lying down. If directed, put ice on the affected area. To do this: Put ice in a plastic bag. Place a towel between your skin and the bag, or between your splint or brace and the bag. Leave the ice on for 20 minutes,   2-3 times a day. Remove the ice if your skin turns bright red. This is very important. If you cannot feel pain, heat, or cold, you have a greater risk of damage to the area. If directed, apply heat to the affected area as often as told by your health care provider. Use the  heat source that your health care provider recommends, such as a moist heat pack or a heating pad. Place a towel between your skin and the heat source. Leave the heat on for 20-30 minutes. Remove the heat if your skin turns bright red. This is especially important if you are unable to feel pain, heat, or cold. You may have a greater risk of getting burned. General instructions Rest the affected area as told by your health care provider. Avoid activities that make pain worse. Use splints, braces, pads, elastic wrap, or walking aids as told by your health care provider. Keep all follow-up visits. This is important. Preventing future episodes Wear knee pads if you kneel often. Wear sturdy running or walking shoes that fit well. Take breaks regularly from repetitive activity. Warm up by stretching before doing any activity that takes a lot of effort. Maintain a healthy weight or lose weight as recommended by your health care provider. If you need help doing this, ask your health care provider. Exercise regularly. Start any new physical activity gradually. Work with your physical or occupational therapist and your health care provider to help determine what caused the bursitis. Contact a health care provider if: You have a fever or chills. Your symptoms do not get better with treatment. You have pain or swelling that gets worse, or it goes away and then comes back. You have pus draining from the affected area. You have redness around the affected area. The affected area is warm to the touch. Summary Bursitis is inflammation and irritation of a bursa, which is a small fluid-filled sac that cushions and protects the joints. Rest the affected area as told by your health care provider. Avoid activities that make pain worse. This condition can usually be treated at home with rest, ice, applying pressure (compression), and raising the body part that is affected (elevation). This is called RICE  therapy. This information is not intended to replace advice given to you by your health care provider. Make sure you discuss any questions you have with your health care provider. Document Revised: 10/10/2021 Document Reviewed: 10/10/2021 Elsevier Patient Education  2023 Elsevier Inc.  

## 2022-02-14 ENCOUNTER — Telehealth: Payer: Self-pay | Admitting: *Deleted

## 2022-02-14 LAB — HEMOGLOBIN A1C
Hgb A1c MFr Bld: 6.3 % of total Hgb — ABNORMAL HIGH (ref <5.7)
Mean Plasma Glucose: 134 mg/dL
eAG (mmol/L): 7.4 mmol/L

## 2022-02-14 LAB — PTH, INTACT AND CALCIUM
Calcium: 10.4 mg/dL (ref 8.6–10.4)
PTH: 93 pg/mL — ABNORMAL HIGH (ref 16–77)

## 2022-02-14 NOTE — Telephone Encounter (Signed)
Pt called, LVMTCB to discuss results and see if pt ok with endo referral in [place].  ?

## 2022-02-14 NOTE — Telephone Encounter (Signed)
Copied from CRM [id number]. Topic: General - Inquiry ?>> Feb 14, 2022  1:24 PM Benay Pike, CMA wrote: ?Reason for CRM: left voicemail to relay lab results and ask if patient was ok with referral to endo in [place]. ?

## 2022-02-15 ENCOUNTER — Telehealth: Payer: Self-pay

## 2022-02-15 ENCOUNTER — Other Ambulatory Visit: Payer: Self-pay | Admitting: Family Medicine

## 2022-02-15 DIAGNOSIS — E213 Hyperparathyroidism, unspecified: Secondary | ICD-10-CM

## 2022-02-15 NOTE — Telephone Encounter (Signed)
Copied from CRM [id number]. Topic: General - Other >> Feb 15, 2022  8:50 AM Gaetana Michaelis A wrote: Reason for CRM: The patient has returned a missed call from S. Somers about results and referrals   Please contact further

## 2022-02-15 NOTE — Telephone Encounter (Signed)
?  Patient notified of lab result and recommendation. Patient states she would be fine to be referred in [place]. ? ?Parathyroid hormone was high but calcium is back to normal, we need to refer her to Endo. Please ask if she is okay going to [facility]  ?Thank you  ?

## 2022-02-15 NOTE — Telephone Encounter (Signed)
Attempted to reach pt, left VM to call back. 

## 2022-03-20 ENCOUNTER — Telehealth: Payer: Self-pay

## 2022-03-20 NOTE — Progress Notes (Signed)
    Chronic Care Management Pharmacy Assistant   Name: JALIN ERPELDING  MRN: [id number] DOB: 1956/05/23  Patient called to be reminded of her telephone appointment with Angelena Sole, CPP on 03/21/2022 @ 0830  No answer, left message of appointment date, time and type of appointment (either telephone or in person). Left message to have all medications, supplements, blood pressure and/or blood sugar logs available during appointment and to return call if need to reschedule.  Star Rating Drug: Atorvastatin 40 mg last filled on 12/13/2021 for a 90-Day supply with [facility] Jardiance 25 mg patient receives this medication through [facility] patient assistance program  Any gaps in medications fill history? No  Care Gaps: Zoster Vaccines COVID-19 Vaccine Booster 3  Adelene Idler, CPA/CMA Clinical Pharmacist Assistant Phone: [phone]

## 2022-03-21 ENCOUNTER — Ambulatory Visit (INDEPENDENT_AMBULATORY_CARE_PROVIDER_SITE_OTHER): Payer: Medicare Other

## 2022-03-21 DIAGNOSIS — E785 Hyperlipidemia, unspecified: Secondary | ICD-10-CM

## 2022-03-21 DIAGNOSIS — E669 Obesity, unspecified: Secondary | ICD-10-CM

## 2022-03-21 DIAGNOSIS — I255 Ischemic cardiomyopathy: Secondary | ICD-10-CM

## 2022-03-21 NOTE — Progress Notes (Signed)
Chronic Care Management Pharmacy Note  03/21/2022 Name:  Kathy Price MRN:  [id number] DOB:  May 12, 1956  Summary: Patient presents for CCM follow-up. Home blood pressure and blood sugar remain well controlled. She continues to get her Delene Loll and Jardiance via PAP and denies medication affordability concerns.  Recommendations/Changes made from today's visit: Continue current medications  Plan: CPP follow-up 6 months   Subjective: Kathy Price is an 66 y.o. year old female who is a primary patient of Steele Sizer, MD.  The CCM team was consulted for assistance with disease management and care coordination needs.    Engaged with patient by telephone for follow up visit in response to provider referral for pharmacy case management and/or care coordination services.   Consent to Services:  The patient was given information about Chronic Care Management services, agreed to services, and gave verbal consent prior to initiation of services.  Please see initial visit note for detailed documentation.   Patient Care Team: Steele Sizer, MD as PCP - General (Family Medicine) Germaine Pomfret, [facility] (Pharmacist) Corey Skains, MD as Consulting Physician (Cardiology)  Recent office visits: 02/13/22: Patient presented to Dr. Ancil Boozer for follow-up. A1c 6.3%.  11/09/21: Patient presented to Dr. Ancil Boozer for shoulder pain. Meloxicam.  09/15/21: Patient presented to Dr. Ancil Boozer for follow-up.  08/14/21: Patient presented to Dr. Ancil Boozer for follow-up.   Recent consult visits: 07/06/21: Patient presented to Dr. Nehemiah Massed (Cardiology) for follow-up.   Hospital visits: None in previous 6 months   Objective:  Lab Results  Component Value Date   CREATININE 1.03 08/14/2021   BUN 17 08/14/2021   EGFR 60 08/14/2021   GFRNONAA 64 02/16/2020   GFRAA 75 02/16/2020   NA 140 08/14/2021   K 4.2 08/14/2021   CALCIUM 10.4 02/13/2022   CO2 23 08/14/2021   GLUCOSE 109 (H) 08/14/2021     Lab Results  Component Value Date/Time   HGBA1C 6.3 (H) 02/13/2022 09:13 AM   HGBA1C 6.3 (A) 08/14/2021 09:06 AM   HGBA1C 6.1 (A) 02/02/2021 09:55 AM   HGBA1C 6.2 02/01/2020 11:30 AM   HGBA1C 6.2 07/17/2019 08:47 AM   MICROALBUR 1.7 08/14/2021 09:41 AM   MICROALBUR 100 08/03/2020 10:33 AM   MICROALBUR 0.8 07/17/2019 12:00 AM   MICROALBUR 20 01/15/2018 12:34 PM    Last diabetic Eye exam:  Lab Results  Component Value Date/Time   HMDIABEYEEXA No Retinopathy 05/05/2021 12:00 AM    Last diabetic Foot exam: No results found for: HMDIABFOOTEX   Lab Results  Component Value Date   CHOL 152 08/14/2021   HDL 53 08/14/2021   LDLCALC 76 08/14/2021   TRIG 145 08/14/2021   CHOLHDL 2.9 08/14/2021       Latest Ref Rng & Units 08/14/2021    9:41 AM 02/16/2020    8:30 AM 03/05/2019    7:59 AM  Hepatic Function  Total Protein 6.1 - 8.1 g/dL 7.3   6.9   7.1    AST 10 - 35 U/L _0 ALT 6 - 29 U/L _1 Total Bilirubin 0.2 - 1.2 mg/dL 0.4   0.4   0.4      No results found for: TSH, FREET4     Latest Ref Rng & Units 08/14/2021    9:41 AM 02/16/2020    8:30 AM 01/20/2018    8:14 AM  CBC  WBC 3.8 - 10.8 Thousand/uL 6.4  6.9   6.5    Hemoglobin 11.7 - 15.5 g/dL 13.3   13.2   12.7    Hematocrit 35.0 - 45.0 % 40.0   40.2   37.0    Platelets 140 - 400 Thousand/uL 258   266   251      Lab Results  Component Value Date/Time   VD25OH 38 01/20/2018 08:14 AM   VD25OH 25 (L) 09/25/2016 09:15 AM    Clinical ASCVD: No  The 10-year ASCVD risk score (Arnett DK, et al., 2019) is: 14%   Values used to calculate the score:     Age: 59 years     Sex: Female     Is Non-Hispanic African American: Yes     Diabetic: Yes     Tobacco smoker: No     Systolic Blood Pressure: 751 mmHg     Is BP treated: No     HDL Cholesterol: 53 mg/dL     Total Cholesterol: 152 mg/dL       02/13/2022    8:38 AM 11/09/2021    9:43 AM 09/15/2021    8:49 AM  Depression screen PHQ 2/9   Decreased Interest 0 0 0  Down, Depressed, Hopeless 0 0 0  PHQ - 2 Score 0 0 0  Altered sleeping 0 0 0  Tired, decreased energy 0 0 0  Change in appetite 0 0 0  Feeling bad or failure about yourself  0 0 0  Trouble concentrating 0 0 0  Moving slowly or fidgety/restless 0 0 0  Suicidal thoughts 0 0 0  PHQ-9 Score 0 0 0  Difficult doing work/chores   Not difficult at all    Social History   Tobacco Use  Smoking Status Former   Packs/day: 1.00   Years: 25.00   Pack years: 25.00   Types: Cigarettes   Quit date: 09/06/2000   Years since quitting: 21.5   Passive exposure: Current (husband smokes inside)  Smokeless Tobacco Never  Tobacco Comments   Smoking cessation materials not required   BP Readings from Last 3 Encounters:  02/13/22 118/74  11/09/21 124/72  09/15/21 124/72   Pulse Readings from Last 3 Encounters:  02/13/22 87  11/09/21 89  09/15/21 86   Wt Readings from Last 3 Encounters:  02/13/22 240 lb (108.9 kg)  11/09/21 242 lb (109.8 kg)  09/15/21 244 lb 1.6 oz (110.7 kg)   BMI Readings from Last 3 Encounters:  02/13/22 43.90 kg/m  11/09/21 44.26 kg/m  09/15/21 44.65 kg/m    Assessment/Interventions: Review of patient past medical history, allergies, medications, health status, including review of consultants reports, laboratory and other test data, was performed as part of comprehensive evaluation and provision of chronic care management services.   SDOH:  (Social Determinants of Health) assessments and interventions performed: Yes   SDOH Screenings   Alcohol Screen: Low Risk    Last Alcohol Screening Score (AUDIT): 0  Depression (PHQ2-9): Low Risk    PHQ-2 Score: 0  Financial Resource Strain: Low Risk    Difficulty of Paying Living Expenses: Not hard at all  Food Insecurity: No Food Insecurity   Worried About Charity fundraiser in the Last Year: Never true   Ran Out of Food in the Last Year: Never true  Housing: Low Risk    Last Housing Risk  Score: 0  Physical Activity: Insufficiently Active   Days of Exercise per Week: 2 days   Minutes of Exercise per Session: 30 min  Social Connections: Engineer, building services of Communication with Friends and Family: More than three times a week   Frequency of Social Gatherings with Friends and Family: More than three times a week   Attends Religious Services: 1 to 4 times per year   Active Member of [employer] or Organizations: Yes   Attends Music therapist: More than 4 times per year   Marital Status: Married  Stress: No Stress Concern Present   Feeling of Stress : Not at all  Tobacco Use: Medium Risk   Smoking Tobacco Use: Former   Smokeless Tobacco Use: Never   Passive Exposure: Current  Transportation Needs: No Data processing manager (Medical): No   Lack of Transportation (Non-Medical): No    CCM Care Plan  Allergies  Allergen Reactions   Metoprolol Other (See Comments)    severe bradycardia   Pantoprazole Rash   Sulfa Antibiotics Rash and Hives    Medications Reviewed Today     Reviewed by Carlene Coria, CMA (Certified Medical Assistant) on 02/13/22 at [phone]  Med List Status: <None>   Medication Order Taking? Sig Documenting Provider Last Dose Status Informant  aspirin EC 81 MG tablet [id number] Yes Take 1 tablet by mouth daily. [provider] Taking Active Self           Med Note Windle Guard, JENNIFER L   Tue May 15, 2016 10:41 AM)    atorvastatin (LIPITOR) 40 MG tablet [id number] Yes TAKE 1 TABLET BY MOUTH EVERY DAY Steele Sizer, MD Taking Active   carvedilol (COREG) 3.125 MG tablet [id number]  Take 1 tablet by mouth 2 (two) times a day. Ralene Bathe, MD  Expired 12/13/21 2359   Cholecalciferol (VITAMIN D3) 2000 UNITS capsule [id number] Yes Take 1 capsule by mouth daily. [provider] Taking Active Self           Med Note Windle Guard, JENNIFER L   Tue May 15, 2016 10:41 AM)    empagliflozin (JARDIANCE) 25 MG  TABS tablet [id number] Yes Take 1 tablet (25 mg total) by mouth daily. Steele Sizer, MD Taking Active   meloxicam (MOBIC) 7.5 MG tablet [id number] Yes TAKE 1 TABLET BY MOUTH 2 TIMES DAILY. Steele Sizer, MD Taking Active   Multiple Vitamins-Minerals (WOMENS MULTIVITAMIN PO) [id number] Yes Take 1 tablet by mouth.  [provider] Taking Active Self  sacubitril-valsartan (ENTRESTO) 24-26 MG [id number] Yes Take 1 tablet by mouth 2 (two) times daily. Ralene Bathe, MD Taking Active   spironolactone (ALDACTONE) 25 MG tablet [id number]  Take 12.5 mg by mouth daily. [provider]  Expired 12/13/21 2359             Patient Active Problem List   Diagnosis Date Noted   Helicobacter pylori gastritis 11/06/2016   Cardiomyopathy, ischemic 01/30/2016   CAD S/P percutaneous coronary angioplasty 09/07/2015   Diabetes mellitus type 2 in obese ([place]) 09/07/2015   Dyslipidemia 09/07/2015   H/O: hysterectomy 09/07/2015   H/O acute myocardial infarction 09/07/2015   Vitamin D deficiency 09/07/2015   Morbid obesity ([place]) 09/07/2015   Bradycardia 05/13/2014   Arteriosclerosis of coronary artery 05/13/2014   Essential (primary) hypertension 05/13/2014   UARS (upper airway resistance syndrome) 01/23/2013    Immunization History  Administered Date(s) Administered   Fluad Quad(high Dose 65+) 08/03/2020, 08/14/2021   Influenza Inj Mdck Quad Pf 07/17/2019   Influenza, Seasonal, Injecte, Preservative Fre 07/29/2012, 06/29/2014   Influenza,inj,Quad PF,6+ Mos 09/29/2013, 06/28/2017, 08/28/2018   Influenza-Unspecified  06/29/2014, 08/25/2015, 08/16/2016   Moderna Sars-Covid-2 Vaccination 12/26/2019, 01/23/2020   PNEUMOCOCCAL CONJUGATE-20 09/15/2021   Pneumococcal Conjugate-13 04/07/2010, 04/05/2020   Pneumococcal Polysaccharide-23 04/07/2010, 12/30/2014, 09/07/2015   Td 04/07/2010   Tdap 04/07/2010, 11/15/2020   Zoster, Live 09/25/2016    Conditions to be addressed/monitored:   Hypertension, Hyperlipidemia, Diabetes, and Heart Failure  There are no care plans that you recently modified to display for this patient.     Medication Assistance:  Jardiance obtained through [place] medication assistance program.  Enrollment ends Dec 2023  Delene Loll obtained through [employer] medication assistance program.  Enrollment ends Dec 2023  Compliance/Adherence/Medication fill history: Care Gaps: Zoster Vaccine COVID-19 Booster 3  Star-Rating Drugs: Atorvastatin 40 mg last filled on 09/16/2021 for a 90-Day supply with [facility] Jardiance 25 mg patient receives this medication through [place] patient assistance Sacubitril-Valsartan 24-26 mg patient receives this medication through [employer] patient assistance    Patient's preferred pharmacy is:  [id number] [place], [place] [address] [place]Phone: [phone]: [phone] [place] [address]Phone: [phone]: [phone] Uses pill box? Yes Pt endorses 100% compliance  We discussed: Current pharmacy is preferred with insurance plan and patient is satisfied with pharmacy services Patient decided to: Continue current medication management strategy  Care Plan and Follow Up Patient Decision:  Patient agrees to Care Plan and Follow-up.  Plan: Telephone follow up appointment with care management team member scheduled for:  10/10/2022 at 8:30 AM  AMalva Limes CEtowahPharmacist Practitioner  [facility]First [phone]

## 2022-03-21 NOTE — Patient Instructions (Signed)
Visit Information It was great speaking with you today!  Please let me know if you have any questions about our visit.   Goals Addressed             This Visit's Progress    Track and Manage Fluids and Swelling-Heart Failure   On track    Timeframe:  Long-Range Goal Priority:  High Start Date:                             Expected End Date:                       Follow Up within 90 days   - call office if I gain more than 2 pounds in one day or 5 pounds in one week - use salt in moderation - watch for swelling in feet, ankles and legs every day - weigh myself daily    Why is this important?   It is important to check your weight daily and watch how much salt and liquids you have.  It will help you to manage your heart failure.    Notes:         Patient Care Plan: General Pharmacy (Adult)     Problem Identified: Hypertension, Hyperlipidemia, Diabetes, and Heart Failure   Priority: High     Long-Range Goal: Patient-Specific Goal   Start Date: 12/13/2021  Expected End Date: 12/13/2022  This Visit's Progress: On track  Recent Progress: On track  Priority: High  Note:   Current Barriers:  No barriers noted  Pharmacist Clinical Goal(s):  Patient will maintain control of diabetes as evidenced by A1c less than 7%  through collaboration with PharmD and provider.   Interventions: 1:1 collaboration with Steele Sizer, MD regarding development and update of comprehensive plan of care as evidenced by provider attestation and co-signature Inter-disciplinary care team collaboration (see longitudinal plan of care) Comprehensive medication review performed; medication list updated in electronic medical record  Diabetes (A1c goal <7%) -Controlled -Current medications: Jardiance 25 mg daily: Appropriate, Effective, Safe, Accessible  -Medications previously tried: NA  -Current home glucose readings fasting glucose: NA -Denies hypoglycemic/hyperglycemic symptoms -Given  microalbuminuria and eGFR of 60-90 mL/min, patient at moderate risk for CKD. Could consider Carrington Clamp for prevention of CKD. Patient preferred to wait at this time. -Recommended to continue current medication  Heart Failure (Goal: manage symptoms and prevent exacerbations) -Controlled -Last ejection fraction: 30-35% (Date: 2019) -HF type: Systolic -NYHA Class: II (slight limitation of activity) -Current treatment: Carvedilol 3.125 mg twice daily: Appropriate, Effective, Safe, Accessible Entresto 24-26 mg daily: Appropriate, Effective, Safe, Accessible  Spironolactone 1/2 tablet daily: Appropriate, Effective, Safe, Accessible  -Medications previously tried: NA  -Current home BP/HR readings: 120/79, 135/70. Pulse in the 80s.  -Current weight has been fluctuating, lost a couple of pounds over last few weeks. Does monitor weight daily and monitors for signs of swelling. -Recommended to continue current medication  Hyperlipidemia: (LDL goal < 70) -Uncontrolled -S/p MI and stent placement 2002.  -Current treatment: Atovastatin 40 mg daily: Appropriate, Effective, Safe, Accessible  -Medications previously tried: NA  -Wasn't taking atorvastatin every day due to concerns that it was worsening joint pain. Resumed taking daily after last labwork, and joint pain has not worsened.  -Recommended to continue current medication  Patient Goals/Self-Care Activities Patient will:  - check glucose daily, document, and provide at future appointments check blood pressure 2-3 times weekly, document, and  provide at future appointments  Follow Up Plan: Telephone follow up appointment with care management team member scheduled for:  10/10/2022 at 8:30 AM    Patient agreed to services and verbal consent obtained.   Patient verbalizes understanding of instructions and care plan provided today and agrees to view in [place]. Active MyChart status and patient understanding of how to access instructions and care  plan via MyChart confirmed with patient.     Malva Limes, [place] Pharmacist Practitioner  [facility] [phone]

## 2022-03-28 ENCOUNTER — Other Ambulatory Visit: Payer: Self-pay | Admitting: Family Medicine

## 2022-03-28 DIAGNOSIS — E785 Hyperlipidemia, unspecified: Secondary | ICD-10-CM | POA: Diagnosis not present

## 2022-03-28 DIAGNOSIS — Z1231 Encounter for screening mammogram for malignant neoplasm of breast: Secondary | ICD-10-CM

## 2022-03-28 DIAGNOSIS — I5020 Unspecified systolic (congestive) heart failure: Secondary | ICD-10-CM | POA: Diagnosis not present

## 2022-03-28 DIAGNOSIS — E1159 Type 2 diabetes mellitus with other circulatory complications: Secondary | ICD-10-CM

## 2022-03-28 DIAGNOSIS — Z7984 Long term (current) use of oral hypoglycemic drugs: Secondary | ICD-10-CM

## 2022-03-28 DIAGNOSIS — Z87891 Personal history of nicotine dependence: Secondary | ICD-10-CM

## 2022-04-12 ENCOUNTER — Ambulatory Visit (INDEPENDENT_AMBULATORY_CARE_PROVIDER_SITE_OTHER): Payer: Medicare Other

## 2022-04-12 VITALS — BP 128/78 | HR 71 | Temp 98.3°F | Resp 16 | Ht 62.0 in | Wt 240.1 lb

## 2022-04-12 DIAGNOSIS — Z0000 Encounter for general adult medical examination without abnormal findings: Secondary | ICD-10-CM | POA: Diagnosis not present

## 2022-04-12 NOTE — Patient Instructions (Signed)
Kathy Price , Thank you for taking time to come for your Medicare Wellness Visit. I appreciate your ongoing commitment to your health goals. Please review the following plan we discussed and let me know if I can assist you in the future.   Screening recommendations/referrals: Colonoscopy: done 05/09/20. Repeat 04/2025 Mammogram: done 04/14/21. Scheduled for 04/23/22 Bone Density: done 04/12/20 Recommended yearly ophthalmology/optometry visit for glaucoma screening and checkup Recommended yearly dental visit for hygiene and checkup  Vaccinations: Influenza vaccine: done 08/14/21 Pneumococcal vaccine: done 09/15/21 Tdap vaccine: done 11/15/20 Shingles vaccine: Shingrix discussed. Please contact your pharmacy for coverage information.  Covid-19:done 12/26/19 & 01/23/20  Advanced directives: Advance directive discussed with you today. I have provided a copy for you to complete at home and have notarized. Once this is complete please bring a copy in to our office so we can scan it into your chart.   Conditions/risks identified: Keep up the great work!  Next appointment: Follow up in one year for your annual wellness visit    Preventive Care 67 Years and Older, Female Preventive care refers to lifestyle choices and visits with your health care provider that can promote health and wellness. What does preventive care include? A yearly physical exam. This is also called an annual well check. Dental exams once or twice a year. Routine eye exams. Ask your health care provider how often you should have your eyes checked. Personal lifestyle choices, including: Daily care of your teeth and gums. Regular physical activity. Eating a healthy diet. Avoiding tobacco and drug use. Limiting alcohol use. Practicing safe sex. Taking low-dose aspirin every day. Taking vitamin and mineral supplements as recommended by your health care provider. What happens during an annual well check? The services and  screenings done by your health care provider during your annual well check will depend on your age, overall health, lifestyle risk factors, and family history of disease. Counseling  Your health care provider may ask you questions about your: Alcohol use. Tobacco use. Drug use. Emotional well-being. Home and relationship well-being. Sexual activity. Eating habits. History of falls. Memory and ability to understand (cognition). Work and work Astronomer. Reproductive health. Screening  You may have the following tests or measurements: Height, weight, and BMI. Blood pressure. Lipid and cholesterol levels. These may be checked every 5 years, or more frequently if you are over 5 years old. Skin check. Lung cancer screening. You may have this screening every year starting at age 46 if you have a 30-pack-year history of smoking and currently smoke or have quit within the past 15 years. Fecal occult blood test (FOBT) of the stool. You may have this test every year starting at age 36. Flexible sigmoidoscopy or colonoscopy. You may have a sigmoidoscopy every 5 years or a colonoscopy every 10 years starting at age 31. Hepatitis C blood test. Hepatitis B blood test. Sexually transmitted disease (STD) testing. Diabetes screening. This is done by checking your blood sugar (glucose) after you have not eaten for a while (fasting). You may have this done every 1-3 years. Bone density scan. This is done to screen for osteoporosis. You may have this done starting at age 10. Mammogram. This may be done every 1-2 years. Talk to your health care provider about how often you should have regular mammograms. Talk with your health care provider about your test results, treatment options, and if necessary, the need for more tests. Vaccines  Your health care provider may recommend certain vaccines, such as: Influenza vaccine. This  is recommended every year. Tetanus, diphtheria, and acellular pertussis (Tdap,  Td) vaccine. You may need a Td booster every 10 years. Zoster vaccine. You may need this after age 17. Pneumococcal 13-valent conjugate (PCV13) vaccine. One dose is recommended after age 67. Pneumococcal polysaccharide (PPSV23) vaccine. One dose is recommended after age 55. Talk to your health care provider about which screenings and vaccines you need and how often you need them. This information is not intended to replace advice given to you by your health care provider. Make sure you discuss any questions you have with your health care provider. Document Released: 11/11/2015 Document Revised: 07/04/2016 Document Reviewed: 08/16/2015 Elsevier Interactive Patient Education  2017 [place] Prevention in the Home Falls can cause injuries. They can happen to people of all ages. There are many things you can do to make your home safe and to help prevent falls. What can I do on the outside of my home? Regularly fix the edges of walkways and driveways and fix any cracks. Remove anything that might make you trip as you walk through a door, such as a raised step or threshold. Trim any bushes or trees on the path to your home. Use bright outdoor lighting. Clear any walking paths of anything that might make someone trip, such as rocks or tools. Regularly check to see if handrails are loose or broken. Make sure that both sides of any steps have handrails. Any raised decks and porches should have guardrails on the edges. Have any leaves, snow, or ice cleared regularly. Use sand or salt on walking paths during winter. Clean up any spills in your garage right away. This includes oil or grease spills. What can I do in the bathroom? Use night lights. Install grab bars by the toilet and in the tub and shower. Do not use towel bars as grab bars. Use non-skid mats or decals in the tub or shower. If you need to sit down in the shower, use a plastic, non-slip stool. Keep the floor dry. Clean up any  water that spills on the floor as soon as it happens. Remove soap buildup in the tub or shower regularly. Attach bath mats securely with double-sided non-slip rug tape. Do not have throw rugs and other things on the floor that can make you trip. What can I do in the bedroom? Use night lights. Make sure that you have a light by your bed that is easy to reach. Do not use any sheets or blankets that are too big for your bed. They should not hang down onto the floor. Have a firm chair that has side arms. You can use this for support while you get dressed. Do not have throw rugs and other things on the floor that can make you trip. What can I do in the kitchen? Clean up any spills right away. Avoid walking on wet floors. Keep items that you use a lot in easy-to-reach places. If you need to reach something above you, use a strong step stool that has a grab bar. Keep electrical cords out of the way. Do not use floor polish or wax that makes floors slippery. If you must use wax, use non-skid floor wax. Do not have throw rugs and other things on the floor that can make you trip. What can I do with my stairs? Do not leave any items on the stairs. Make sure that there are handrails on both sides of the stairs and use them. Fix handrails that  are broken or loose. Make sure that handrails are as Price as the stairways. Check any carpeting to make sure that it is firmly attached to the stairs. Fix any carpet that is loose or worn. Avoid having throw rugs at the top or bottom of the stairs. If you do have throw rugs, attach them to the floor with carpet tape. Make sure that you have a light switch at the top of the stairs and the bottom of the stairs. If you do not have them, ask someone to add them for you. What else can I do to help prevent falls? Wear shoes that: Do not have high heels. Have rubber bottoms. Are comfortable and fit you well. Are closed at the toe. Do not wear sandals. If you use a  stepladder: Make sure that it is fully opened. Do not climb a closed stepladder. Make sure that both [place] of the stepladder are locked into place. Ask someone to hold it for you, if possible. Clearly mark and make sure that you can see: Any grab bars or handrails. First and last steps. Where the edge of each step is. Use tools that help you move around (mobility aids) if they are needed. These include: Canes. Walkers. Scooters. Crutches. Turn on the lights when you go into a dark area. Replace any light bulbs as soon as they burn out. Set up your furniture so you have a clear path. Avoid moving your furniture around. If any of your floors are uneven, fix them. If there are any pets around you, be aware of where they are. Review your medicines with your doctor. Some medicines can make you feel dizzy. This can increase your chance of falling. Ask your doctor what other things that you can do to help prevent falls. This information is not intended to replace advice given to you by your health care provider. Make sure you discuss any questions you have with your health care provider. Document Released: 08/11/2009 Document Revised: 03/22/2016 Document Reviewed: 11/19/2014 Elsevier Interactive Patient Education  2017 [employer].

## 2022-04-12 NOTE — Progress Notes (Signed)
Subjective:   Kathy BarriosShelva L Price is a 66 y.o. female who presents for Medicare Annual (Subsequent) preventive examination.  Review of Systems     Cardiac Risk Factors include: advanced age (>2655men, 54>65 women);diabetes mellitus;dyslipidemia;hypertension;obesity (BMI >30kg/m2)     Objective:    Today's Vitals   04/12/22 0828  BP: 128/78  Pulse: 71  Resp: 16  Temp: 98.3 F (36.8 C)  TempSrc: Oral  SpO2: 98%  Weight: 240 lb 1.6 oz (108.9 kg)  Height: 5\' 2"  (1.575 m)   Body mass index is 43.91 kg/m.     04/12/2022    8:33 AM 04/06/2021    8:37 AM 05/09/2020    8:25 AM 06/28/2017    9:03 AM 01/22/2017    8:51 AM 11/05/2016    7:35 AM 08/28/2016   10:23 AM  Advanced Directives  Does Patient Have a Medical Advance Directive? No No No No No No No  Would patient like information on creating a medical advance directive? Yes (MAU/Ambulatory/Procedural Areas - Information given) Yes (MAU/Ambulatory/Procedural Areas - Information given) No - Patient declined        Current Medications (verified) Outpatient Encounter Medications as of 04/12/2022  Medication Sig   acetaminophen (TYLENOL) 500 MG tablet Take 500 mg by mouth every 6 (six) hours as needed for moderate pain.   aspirin EC 81 MG tablet Take 1 tablet by mouth daily.   atorvastatin (LIPITOR) 40 MG tablet Take 1 tablet (40 mg total) by mouth daily.   Cholecalciferol (VITAMIN D3) 2000 UNITS capsule Take 1 capsule by mouth daily.   empagliflozin (JARDIANCE) 25 MG TABS tablet Take 1 tablet (25 mg total) by mouth daily.   Multiple Vitamins-Minerals (WOMENS MULTIVITAMIN PO) Take 1 tablet by mouth.    sacubitril-valsartan (ENTRESTO) 24-26 MG Take 1 tablet by mouth 2 (two) times daily.   carvedilol (COREG) 3.125 MG tablet Take 1 tablet by mouth 2 (two) times a day.   spironolactone (ALDACTONE) 25 MG tablet Take 12.5 mg by mouth daily.   No facility-administered encounter medications on file as of 04/12/2022.    Allergies  (verified) Metoprolol, Pantoprazole, and Sulfa antibiotics   History: Past Medical History:  Diagnosis Date   Back pain    Breathing-related sleep disorder    CAD (coronary artery disease)    Diabetes mellitus without complication (HCC)    History of MI (myocardial infarction)    Hyperlipidemia    Myocardial infarction (HCC)    Obese    Postablative ovarian failure    Vitamin D deficiency    Past Surgical History:  Procedure Laterality Date   ABDOMINAL HYSTERECTOMY     COLONOSCOPY     COLONOSCOPY WITH PROPOFOL N/A 05/09/2020   Procedure: COLONOSCOPY WITH PROPOFOL;  Surgeon: Wyline MoodAnna, Kiran, MD;  [facility];  Service: Gastroenterology;  Laterality: N/A;   CORONARY ANGIOPLASTY  2002   stents   ESOPHAGOGASTRODUODENOSCOPY (EGD) WITH PROPOFOL N/A 11/05/2016   Procedure: ESOPHAGOGASTRODUODENOSCOPY (EGD) WITH PROPOFOL;  Surgeon: Wyline MoodKiran Anna, MD;  Location: [facility] ENDOSCOPY;  Service: Endoscopy;  Laterality: N/A;   Family History  Problem Relation Age of Onset   Diabetes Mother    Hypertension Mother    Cancer Father    CAD Brother    Breast cancer Sister [id number]   Social History   Socioeconomic History   Marital status: Married    Spouse name: Dennard Nipugene   Number of children: 3   Years of education: Not on file   Highest education level: GED or equivalent  Occupational History   Occupation: retired  Tobacco Use   Smoking status: Former    Packs/day: 1.00    Years: 25.00    Total pack years: 25.00    Types: Cigarettes    Quit date: 09/06/2000    Years since quitting: 21.6    Passive exposure: Current (husband smokes inside)   Smokeless tobacco: Never   Tobacco comments:    Smoking cessation materials not required  Vaping Use   Vaping Use: Never used  Substance and Sexual Activity   Alcohol use: No    Alcohol/week: 0.0 standard drinks of alcohol   Drug use: No   Sexual activity: Yes    Partners: Male  Other Topics Concern   Not on file  Social History Narrative    Not on file   Social Determinants of Health   Financial Resource Strain: Low Risk  (04/12/2022)   Overall Financial Resource Strain (CARDIA)    Difficulty of Paying Living Expenses: Not hard at all  Food Insecurity: No Food Insecurity (04/12/2022)   Hunger Vital Sign    Worried About Running Out of Food in the Last Year: Never true    Ran Out of Food in the Last Year: Never true  Transportation Needs: No Transportation Needs (04/12/2022)   PRAPARE - Administrator, Civil Service (Medical): No    Lack of Transportation (Non-Medical): No  Physical Activity: Insufficiently Active (04/12/2022)   Exercise Vital Sign    Days of Exercise per Week: 2 days    Minutes of Exercise per Session: 30 min  Stress: No Stress Concern Present (04/12/2022)   [employer] of Occupational Health - Occupational Stress Questionnaire    Feeling of Stress : Not at all  Social Connections: Socially Integrated (04/12/2022)   Social Connection and Isolation Panel [NHANES]    Frequency of Communication with Friends and Family: More than three times a week    Frequency of Social Gatherings with Friends and Family: More than three times a week    Attends Religious Services: More than 4 times per year    Active Member of [employer] or Organizations: Yes    Attends Engineer, structural: More than 4 times per year    Marital Status: Married    Tobacco Counseling Counseling given: Not Answered Tobacco comments: Smoking cessation materials not required   Clinical Intake:  Pre-visit preparation completed: Yes  Pain : No/denies pain     BMI - recorded: 43.91 Nutritional Status: BMI > 30  Obese Nutritional Risks: None Diabetes: Yes CBG done?: No Did pt. bring in CBG monitor from home?: No  How often do you need to have someone help you when you read instructions, pamphlets, or other written materials from your doctor or pharmacy?: 1 - Never  Nutrition Risk Assessment:  Has the patient  had any N/V/D within the last 2 months?  No  Does the patient have any non-healing wounds?  No  Has the patient had any unintentional weight loss or weight gain?  No   Diabetes:  Is the patient diabetic?  Yes  If diabetic, was a CBG obtained today?  No  Did the patient bring in their glucometer from home?  No  How often do you monitor your CBG's? Pt does not actively check blood sugar.   Financial Strains and Diabetes Management:  Are you having any financial strains with the device, your supplies or your medication? No .  Does the patient want to be seen by  Chronic Care Management for management of their diabetes?  No  Would the patient like to be referred to a Nutritionist or for Diabetic Management?  No   Diabetic Exams:  Diabetic Eye Exam: Completed 05/05/21.   Diabetic Foot Exam: Completed 02/13/22.  Interpreter Needed?: No  Information entered by :: Reather Littler LPN   Activities of Daily Living    04/12/2022    8:34 AM 02/13/2022    8:38 AM  In your present state of health, do you have any difficulty performing the following activities:  Hearing? 0 0  Vision? 0 0  Difficulty concentrating or making decisions? 0 0  Walking or climbing stairs? 0 0  Dressing or bathing? 0 0  Doing errands, shopping? 0 0  Preparing Food and eating ? N   Using the Toilet? N   In the past six months, have you accidently leaked urine? N   Do you have problems with loss of bowel control? N   Managing your Medications? N   Managing your Finances? N   Housekeeping or managing your Housekeeping? N     Patient Care Team: Alba Cory, MD as PCP - General (Family Medicine) Gaspar Cola, [facility] (Pharmacist) Lamar Blinks, MD as Consulting Physician (Cardiology)  Indicate any recent Medical Services you may have received from other than [facility] providers in the past year (date may be approximate).     Assessment:   This is a routine wellness examination for Kathy Price.  Hearing/Vision  screen Hearing Screening - Comments:: Pt denies hearing difficulty  Vision Screening - Comments:: [facility] vision screening at [facility]   Dietary issues and exercise activities discussed: Current Exercise Habits: Home exercise routine, Type of exercise: Other - see comments (exercise bike), Time (Minutes): 30, Frequency (Times/Week): 2, Weekly Exercise (Minutes/Week): 60, Intensity: Moderate, Exercise limited by: None identified   Goals Addressed   None    Depression Screen    04/12/2022    8:33 AM 02/13/2022    8:38 AM 11/09/2021    9:43 AM 09/15/2021    8:49 AM 08/14/2021    8:52 AM 04/06/2021    8:37 AM 02/02/2021    9:33 AM  PHQ 2/9 Scores  PHQ - 2 Score 0 0 0 0 0 0 0  PHQ- 9 Score  0 0 0 0      Fall Risk    04/12/2022    8:34 AM 02/13/2022    8:38 AM 11/09/2021    9:42 AM 09/15/2021    8:48 AM 08/14/2021    8:52 AM  Fall Risk   Falls in the past year? 0 0 0 0 0  Number falls in past yr: 0 0 0 0 0  Injury with Fall? 0 0 0 0 0  Risk for fall due to : No Fall Risks No Fall Risks No Fall Risks No Fall Risks No Fall Risks  Follow up Falls prevention discussed Falls prevention discussed Falls prevention discussed Falls prevention discussed Falls prevention discussed    FALL RISK PREVENTION PERTAINING TO THE HOME:  Any stairs in or around the home? No  If so, are there any without handrails? No  Home free of loose throw rugs in walkways, pet beds, electrical cords, etc? Yes  Adequate lighting in your home to reduce risk of falls? Yes   ASSISTIVE DEVICES UTILIZED TO PREVENT FALLS:  Life alert? No  Use of a cane, walker or w/c? No  Grab bars in the bathroom? No  Shower chair  or bench in shower? No  Elevated toilet seat or a handicapped toilet? No   TIMED UP AND GO:  Was the test performed? Yes .  Length of time to ambulate 10 feet: 5 sec.   Gait steady and fast without use of assistive device  Cognitive Function: Normal cognitive status assessed by direct  observation by this Nurse Health Advisor. No abnormalities found.          Immunizations Immunization History  Administered Date(s) Administered   Fluad Quad(high Dose 65+) 08/03/2020, 08/14/2021   Influenza Inj Mdck Quad Pf 07/17/2019   Influenza, Seasonal, Injecte, Preservative Fre 07/29/2012, 06/29/2014   Influenza,inj,Quad PF,6+ Mos 09/29/2013, 06/28/2017, 08/28/2018   Influenza-Unspecified 06/29/2014, 08/25/2015, 08/16/2016   Moderna Sars-Covid-2 Vaccination 12/26/2019, 01/23/2020   PNEUMOCOCCAL CONJUGATE-20 09/15/2021   Pneumococcal Conjugate-13 04/07/2010, 04/05/2020   Pneumococcal Polysaccharide-23 04/07/2010, 12/30/2014, 09/07/2015   Td 04/07/2010   Tdap 04/07/2010, 11/15/2020   Zoster, Live 09/25/2016    TDAP status: Up to date  Flu Vaccine status: Up to date  Pneumococcal vaccine status: Up to date  Covid-19 vaccine status: Completed vaccines  Qualifies for Shingles Vaccine? Yes   Zostavax completed Yes   Shingrix Completed?: No.    Education has been provided regarding the importance of this vaccine. Patient has been advised to call insurance company to determine out of pocket expense if they have not yet received this vaccine. Advised may also receive vaccine at local pharmacy or Health Dept. Verbalized acceptance and understanding.  Screening Tests Health Maintenance  Topic Date Due   Zoster Vaccines- Shingrix (1 of 2) Never done   COVID-19 Vaccine (3 - Moderna series) 03/19/2020   OPHTHALMOLOGY EXAM  05/05/2022   INFLUENZA VACCINE  05/29/2022   HEMOGLOBIN A1C  08/15/2022   FOOT EXAM  02/14/2023   MAMMOGRAM  04/15/2023   COLONOSCOPY (Pts 45-75yrs Insurance coverage will need to be confirmed)  05/09/2025   TETANUS/TDAP  11/15/2030   Pneumonia Vaccine 1+ Years old  Completed   DEXA SCAN  Completed   Hepatitis C Screening  Completed   HPV VACCINES  Aged Out    Health Maintenance  Health Maintenance Due  Topic Date Due   Zoster Vaccines- Shingrix (1  of 2) Never done   COVID-19 Vaccine (3 - Moderna series) 03/19/2020    Colorectal cancer screening: Type of screening: Colonoscopy. Completed 05/09/20. Repeat every 5 years  Mammogram status: Completed 04/14/21. Repeat every year. Scheduled for 04/23/22  Bone Density status: Completed 04/12/20. Results reflect: Bone density results: NORMAL. Repeat every 3-5 years.  Lung Cancer Screening: (Low Dose CT Chest recommended if Age 53-80 years, 30 pack-year currently smoking OR have quit w/in 15years.) does not qualify.   Additional Screening:  Hepatitis C Screening: does qualify; Completed 07/28/13  Vision Screening: Recommended annual ophthalmology exams for early detection of glaucoma and other disorders of the eye. Is the patient up to date with their annual eye exam?  Yes - [facility]  Who is the provider or what is the name of the office in which the patient attends annual eye exams? Not established.   Dental Screening: Recommended annual dental exams for proper oral hygiene  Community Resource Referral / Chronic Care Management: CRR required this visit?  No   CCM required this visit?  No      Plan:     I have personally reviewed and noted the following in the patient's chart:   Medical and social history Use of alcohol, tobacco or illicit drugs  Current medications and supplements including opioid prescriptions.  Functional ability and status Nutritional status Physical activity Advanced directives List of other physicians Hospitalizations, surgeries, and ER visits in previous 12 months Vitals Screenings to include cognitive, depression, and falls Referrals and appointments  In addition, I have reviewed and discussed with patient certain preventive protocols, quality metrics, and best practice recommendations. A written personalized care plan for preventive services as well as general preventive health recommendations were provided to patient.     Reather Littler,  LPN   1/61/0960   Nurse Notes: none

## 2022-04-23 ENCOUNTER — Ambulatory Visit
Admission: RE | Admit: 2022-04-23 | Discharge: 2022-04-23 | Disposition: A | Discharge status: Home / Self Care | Payer: Medicare Other | Source: Ambulatory Visit | Attending: Family Medicine | Admitting: Family Medicine

## 2022-04-23 DIAGNOSIS — Z1231 Encounter for screening mammogram for malignant neoplasm of breast: Secondary | ICD-10-CM | POA: Insufficient documentation

## 2022-08-02 ENCOUNTER — Encounter: Payer: Self-pay | Admitting: Family Medicine

## 2022-08-02 ENCOUNTER — Ambulatory Visit (INDEPENDENT_AMBULATORY_CARE_PROVIDER_SITE_OTHER): Payer: Medicare Other | Admitting: Family Medicine

## 2022-08-02 VITALS — BP 128/76 | HR 83 | Temp 98.3°F | Resp 16 | Ht 62.0 in | Wt 242.2 lb

## 2022-08-02 DIAGNOSIS — M25561 Pain in right knee: Secondary | ICD-10-CM

## 2022-08-02 MED ORDER — MELOXICAM 15 MG PO TABS: 7.5000 mg | ORAL_TABLET | Freq: Every day | ORAL | 1 refills | Status: DC

## 2022-08-02 NOTE — Patient Instructions (Addendum)
[facility] [address] [address], [place] [address] Phone: [phone] [email or web address] 9 am to 9 pm walk in hours/urgent care   Acute Knee Pain, Adult Many things can cause knee pain. Sometimes, knee pain is sudden (acute) and may be caused by damage, swelling, or irritation of the muscles and tissues that support your knee. The pain often goes away on its own with time and rest. If the pain does not go away, tests may be done to find out what is causing the pain. Follow these instructions at home: If you have a knee sleeve or brace:  Wear the knee sleeve or brace as told by your doctor. Take it off only as told by your doctor. Loosen it if your toes: Tingle. Become numb. Turn cold and blue. Keep it clean. If the knee sleeve or brace is not waterproof: Do not let it get wet. Cover it with a watertight covering when you take a bath or shower. Activity Rest your knee. Do not do things that cause pain or make pain worse. Avoid activities where both feet leave the ground at the same time (high-impact activities). Examples are running, jumping rope, and doing jumping jacks. Work with a physical therapist to make a safe exercise program, as told by your doctor. Managing pain, stiffness, and swelling  If told, put ice on the knee. To do this: If you have a removable knee sleeve or brace, take it off as told by your doctor. Put ice in a plastic bag. Place a towel between your skin and the bag. Leave the ice on for 20 minutes, 2-3 times a day. Take off the ice if your skin turns bright red. This is very important. If you cannot feel pain, heat, or cold, you have a greater risk of damage to the area. If told, use an elastic bandage to put pressure (compression) on your injured knee. Raise your knee above the level of your heart while you are sitting or lying down. Sleep with a pillow under your knee. General instructions Take  over-the-counter and prescription medicines only as told by your doctor. Do not smoke or use any products that contain nicotine or tobacco. If you need help quitting, ask your doctor. If you are overweight, work with your doctor and a food expert (dietitian) to set goals to lose weight. Being overweight can make your knee hurt more. Watch for any changes in your symptoms. Keep all follow-up visits. Contact a doctor if: The knee pain does not stop. The knee pain changes or gets worse. You have a fever along with knee pain. Your knee is red or feels warm when you touch it. Your knee gives out or locks up. Get help right away if: Your knee swells, and the swelling gets worse. You cannot move your knee. You have very bad knee pain that does not get better with pain medicine. Summary Many things can cause knee pain. The pain often goes away on its own with time and rest. Your doctor may do tests to find out the cause of the pain. Watch for any changes in your symptoms. Relieve your pain with rest, medicines, light activity, and use of ice. Get help right away if you cannot move your knee or your knee pain is very bad. This information is not intended to replace advice given to you by your health care provider. Make sure you discuss any questions you have with your health care provider. Document Revised: 03/30/2020 Document Reviewed: 03/30/2020  Elsevier Patient Education  2023 [employer]  

## 2022-08-02 NOTE — Progress Notes (Signed)
Patient ID: Kathy Price, female    DOB: [birth year], 65 y.o.   MRN: [id number]  PCP: Alba Cory, MD  Chief Complaint  Patient presents with   Knee Pain    On set for 5 days no injury    Subjective:   Kathy Price is a 66 y.o. female, presents to clinic with CC of the following:  HPI  Patient presents for right knee pain onset gradually about 5 days ago without injury.  She had swelling which she could see to the front of her knee she also has some pain to the back of her knee, pain is exacerbated with full extension of her leg, there was no crepitus, instability, injury, erythema. She has not had any knee problems in the past or other known osteoarthritis or other types of arthritis Pain is worse with weightbearing and she has been using her daughter's cane She has been applying topical medicines and ice and resting it and it has slightly improved with a little less swelling noted today Pain is fairly constant and is worse at night Taking tylenol otc and nothing else, it helps a little  Patient Active Problem List   Diagnosis Date Noted   Helicobacter pylori gastritis 11/06/2016   Cardiomyopathy, ischemic 01/30/2016   CAD S/P percutaneous coronary angioplasty 09/07/2015   Diabetes mellitus type 2 in obese (HCC) 09/07/2015   Dyslipidemia 09/07/2015   H/O: hysterectomy 09/07/2015   H/O acute myocardial infarction 09/07/2015   Vitamin D deficiency 09/07/2015   Morbid obesity (HCC) 09/07/2015   Bradycardia 05/13/2014   Arteriosclerosis of coronary artery 05/13/2014   Essential (primary) hypertension 05/13/2014   UARS (upper airway resistance syndrome) 01/23/2013      Current Outpatient Medications:    acetaminophen (TYLENOL) 500 MG tablet, Take 500 mg by mouth every 6 (six) hours as needed for moderate pain., Disp: , Rfl:    aspirin EC 81 MG tablet, Take 1 tablet by mouth daily., Disp: , Rfl:    atorvastatin (LIPITOR) 40 MG tablet, Take 1 tablet (40 mg total) by  mouth daily., Disp: 90 tablet, Rfl: 1   Cholecalciferol (VITAMIN D3) 2000 UNITS capsule, Take 1 capsule by mouth daily., Disp: , Rfl:    empagliflozin (JARDIANCE) 25 MG TABS tablet, Take 1 tablet (25 mg total) by mouth daily., Disp: 90 tablet, Rfl: 3   meloxicam (MOBIC) 15 MG tablet, Take 0.5-1 tablets (7.5-15 mg total) by mouth daily., Disp: 30 tablet, Rfl: 1   Multiple Vitamins-Minerals (WOMENS MULTIVITAMIN PO), Take 1 tablet by mouth. , Disp: , Rfl:    sacubitril-valsartan (ENTRESTO) 24-26 MG, Take 1 tablet by mouth 2 (two) times daily., Disp: , Rfl:    carvedilol (COREG) 3.125 MG tablet, Take 1 tablet by mouth 2 (two) times a day., Disp: , Rfl:    spironolactone (ALDACTONE) 25 MG tablet, Take 12.5 mg by mouth daily., Disp: , Rfl:    Allergies  Allergen Reactions   Metoprolol Other (See Comments)    severe bradycardia   Pantoprazole Rash   Sulfa Antibiotics Rash and Hives     Social History   Tobacco Use   Smoking status: Former    Packs/day: 1.00    Years: 25.00    Total pack years: 25.00    Types: Cigarettes    Quit date: 09/06/2000    Years since quitting: 21.9    Passive exposure: Current (husband smokes inside)   Smokeless tobacco: Never   Tobacco comments:    Smoking cessation  materials not required  Vaping Use   Vaping Use: Never used  Substance Use Topics   Alcohol use: No    Alcohol/week: 0.0 standard drinks of alcohol   Drug use: No      Chart Review Today: I personally reviewed active problem list, medication list, allergies, family history, social history, health maintenance, notes from last encounter, lab results, imaging with the patient/caregiver today.   Review of Systems  Constitutional: Negative.   HENT: Negative.    Eyes: Negative.   Respiratory: Negative.    Cardiovascular: Negative.   Gastrointestinal: Negative.   Endocrine: Negative.   Genitourinary: Negative.   Musculoskeletal: Negative.   Skin: Negative.   Allergic/Immunologic:  Negative.   Neurological: Negative.   Hematological: Negative.   Psychiatric/Behavioral: Negative.    All other systems reviewed and are negative.      Objective:   Vitals:   08/02/22 1345  BP: 128/76  Pulse: 83  Resp: 16  Temp: 98.3 F (36.8 C)  TempSrc: Oral  SpO2: 98%  Weight: 242 lb 3.2 oz (109.9 kg)  Height: 5\' 2"  (1.575 m)    Body mass index is 44.3 kg/m.  Physical Exam Vitals and nursing note reviewed.  Constitutional:      General: She is not in acute distress.    Appearance: Normal appearance. She is well-developed and well-groomed. She is morbidly obese. She is not ill-appearing, toxic-appearing or diaphoretic.  Musculoskeletal:     Right knee: Swelling and effusion present. No deformity, erythema, ecchymosis or crepitus. Decreased range of motion. Tenderness present. No ACL, PCL or patellar tendon tenderness. Abnormal meniscus. Normal alignment and normal patellar mobility. Normal pulse.     Instability Tests: Anterior drawer test negative.     Right lower leg: No edema.     Left lower leg: No edema.     Comments: Right knee with minimal edema and possible small effusion on exam, very tender to palpation to popliteal fossa, cannot fully extend w/o severe pain Pain with mcmurray, worse pain with valgus stress No crepitus or instability Can bear weight but not get up or down from exam table with leading with right leg/knee  Skin:    Findings: No erythema.  Neurological:     Mental Status: She is alert.     Sensory: No sensory deficit.     Coordination: Coordination normal.     Gait: Gait abnormal (antalgic, using cane).  Psychiatric:        Behavior: Behavior is cooperative.      Results for orders placed or performed in visit on 02/13/22  HgB A1c  Result Value Ref Range   Hgb A1c MFr Bld 6.3 (H) <5.7 % of total Hgb   Mean Plasma Glucose 134 mg/dL   eAG (mmol/L) 7.4 mmol/L  PTH, intact and calcium  Result Value Ref Range   PTH 93 (H) 16 - 77 pg/mL    Calcium 10.4 8.6 - 10.4 mg/dL       Assessment & Plan:     ICD-10-CM   1. Acute pain of right knee  M25.561 meloxicam (MOBIC) 15 MG tablet    DG Knee Complete 4 Views Right     Patient with new right knee pain with swelling versus effusion x5 days without injury Possible meniscal involvement but also may be osteoarthritis -she does have fairly severe pain with attempting to fully extend her right leg she also had popliteal tenderness on exam and pain with meniscal testing, anterior knee pain with medial palpation,  I did not feel any crepitus and she has negative anterior drawer test. She has been seeing some improvement in the last day with conservative measures at home including icing resting and Tylenol  Discussed options for management including Ortho consult versus continued conservative management and watching and waiting -she preferred to wait on the Ortho referral, she did did want the x-ray which I explained its limited utility Encouraged her to get a knee sleeve or brace continue to ice and rest I reviewed her blood pressures and kidney function and I do feel she could tolerate some Mobic for a few weeks, she should also continue Tylenol as her first-line pain medicine.  I gave her contact information for [facility] should she have worsening pain she can go in for urgent care evaluation or she can let me know and I will put in the referral.  I explained to her that this could resolve gradually on its own or she may have continued pain and will likely need to see Ortho for further evaluation and treatment.  There was no injury, deformity, instability, no erythema or constitutional symptoms and no concern for any joint infection.  she does have follow-up with her PCP in about 2 weeks - if she does not need anything before them she will f/up with PCP    Handout and info given and included on AVS - printed and pt has access to [employer], PA-C 08/02/22 2:04 PM

## 2022-08-03 ENCOUNTER — Telehealth: Payer: Self-pay

## 2022-08-03 ENCOUNTER — Ambulatory Visit
Admission: RE | Admit: 2022-08-03 | Discharge: 2022-08-03 | Disposition: A | Discharge status: Home / Self Care | Payer: Medicare Other | Source: Ambulatory Visit | Attending: Family Medicine | Admitting: Family Medicine

## 2022-08-03 ENCOUNTER — Ambulatory Visit
Admission: RE | Admit: 2022-08-03 | Discharge: 2022-08-03 | Disposition: A | Discharge status: Home / Self Care | Payer: Medicare Other | Attending: Family Medicine | Admitting: Family Medicine

## 2022-08-03 DIAGNOSIS — M25561 Pain in right knee: Secondary | ICD-10-CM

## 2022-08-03 NOTE — Progress Notes (Signed)
    Chronic Care Management Pharmacy Assistant   Name: Kathy Price  MRN: [id number] DOB: [birth year]  Reason for Encounter: 2024 Patient Assistance Renewal Application Jardiance  Patient is currently receiving patient assistance for her Jardiance through [facility]. [employer] requires all Medicare patient's to renew their applications starting 75/07/2584.  Renewal Application started, and will be mailed to the patient's home address for her to complete. Once completed by the patient she will be instructed to return the application along with her proof of income to [place] at [facility].  Application e-mailed to PTM so she can mail the application to the patient's home address. Patient can contact me directly @ [phone] if she has any questions.   Medications: Outpatient Encounter Medications as of 08/03/2022  Medication Sig   acetaminophen (TYLENOL) 500 MG tablet Take 500 mg by mouth every 6 (six) hours as needed for moderate pain.   aspirin EC 81 MG tablet Take 1 tablet by mouth daily.   atorvastatin (LIPITOR) 40 MG tablet Take 1 tablet (40 mg total) by mouth daily.   carvedilol (COREG) 3.125 MG tablet Take 1 tablet by mouth 2 (two) times a day.   Cholecalciferol (VITAMIN D3) 2000 UNITS capsule Take 1 capsule by mouth daily.   empagliflozin (JARDIANCE) 25 MG TABS tablet Take 1 tablet (25 mg total) by mouth daily.   meloxicam (MOBIC) 15 MG tablet Take 0.5-1 tablets (7.5-15 mg total) by mouth daily.   Multiple Vitamins-Minerals (WOMENS MULTIVITAMIN PO) Take 1 tablet by mouth.    sacubitril-valsartan (ENTRESTO) 24-26 MG Take 1 tablet by mouth 2 (two) times daily.   spironolactone (ALDACTONE) 25 MG tablet Take 12.5 mg by mouth daily.   No facility-administered encounter medications on file as of 08/03/2022.    Lynann Bologna, CPA/CMA Clinical Pharmacist Assistant Phone: [phone]

## 2022-08-09 ENCOUNTER — Telehealth: Payer: Self-pay | Admitting: Family Medicine

## 2022-08-09 NOTE — Telephone Encounter (Signed)
Copied from [place] [id number]. Topic: Referral - Request for Referral >> Aug 09, 2022  8:03 AM Ludger Nutting wrote: Has patient seen PCP for this complaint? Yes.   *If NO, is insurance requiring patient see PCP for this issue before PCP can refer them? Referral for which specialty: Ortho Preferred provider/office:  Reason for referral: Knee Pain

## 2022-08-10 NOTE — Telephone Encounter (Signed)
Called patient and made aware she can do a walk-in visit to [facility]. I gave her the [place] locations number and hours of operation. Patient gave verbal understanding.

## 2022-08-14 NOTE — Progress Notes (Unsigned)
Name: Kathy Price   MRN: [id number]    DOB: April 06, 1956   Date:08/15/2022       Progress Note  Subjective  Chief Complaint  Follow Up  HPI  DMII: A1C has been at goal, today is 6 % . She has not been checking her glucose lately. She denies polyphagia, polydipsia or polyuria. Urine micro was elevated at 31 and  she is on ARB ( on the form of Entresto) . She is currently only taking Jardiance and denies hypoglycemic episode. She is due for labs , urine micro and eye exam   CAD: s/p MI and stent placement 08/2001,  sees  Dr. 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 Stress test only showed old lesion, no acute ischemia She has been taking apirin 81 mg daily, Entresto, spironolactone , carvedilol and atorvastatin. She denies SOB ,diaphoresis, orthopnea or edema.  She has been compliant with Atorvastatin now and we will recheck labs    INTERPRETATION 03/2022 MILD LV SYSTOLIC DYSFUNCTION (See above)   WITH MILD LVH  NORMAL RIGHT VENTRICULAR SYSTOLIC FUNCTION  TRIVIAL REGURGITATION NOTED (See above)  NO VALVULAR STENOSIS  TRIVIAL TR, MR, PR  EF 40%    GERD:she had EGD done by Dr. Tobi Bastos back 10/2016 and was treated for h. Pylori gastritis. She is no  longer having indigestion or epigastric pain, she has heart burn very seldom usually triggered by diet, still takes Tums or Pepcid otc prn only    Morbid Obesity: she gained 20 lbs from  07/2018 until 2021, stopped working August 2019 and has not been as active, states also gained more since COVID. Her weight has been around 240 lbs. She states not walking much since right knee pain BMI is over 40  OA knee: she was seen here and followed up with Ortho had steroid injection yesterday, still using a cane   Hyperlipidemia: LDL was 76 back in October, but states she was not taking medication daily at that time, she has been compliant since , we will recheck labs today    Hyperparathyroidism: she has an appointment with Endo, reviewed labs with patient    Patient Active Problem List   Diagnosis Date Noted   Helicobacter pylori gastritis 11/06/2016   Cardiomyopathy, ischemic 01/30/2016   CAD S/P percutaneous coronary angioplasty 09/07/2015   Diabetes mellitus type 2 in obese (HCC) 09/07/2015   Dyslipidemia 09/07/2015   H/O: hysterectomy 09/07/2015   H/O acute myocardial infarction 09/07/2015   Vitamin D deficiency 09/07/2015   Morbid obesity (HCC) 09/07/2015   Bradycardia 05/13/2014   Arteriosclerosis of coronary artery 05/13/2014   Essential (primary) hypertension 05/13/2014   UARS (upper airway resistance syndrome) 01/23/2013    Past Surgical History:  Procedure Laterality Date   ABDOMINAL HYSTERECTOMY     COLONOSCOPY     COLONOSCOPY WITH PROPOFOL N/A 05/09/2020   Procedure: COLONOSCOPY WITH PROPOFOL;  Surgeon: Wyline Mood, MD;  Location: [facility] ENDOSCOPY;  Service: Gastroenterology;  Laterality: N/A;   CORONARY ANGIOPLASTY  2002   stents   ESOPHAGOGASTRODUODENOSCOPY (EGD) WITH PROPOFOL N/A 11/05/2016   Procedure: ESOPHAGOGASTRODUODENOSCOPY (EGD) WITH PROPOFOL;  Surgeon: Wyline Mood, MD;  Location: [facility] ENDOSCOPY;  Service: Endoscopy;  Laterality: N/A;    Family History  Problem Relation Age of Onset   Diabetes Mother    Hypertension Mother    Cancer Father    CAD Brother    Breast cancer Sister 39    Social History  Tobacco Use   Smoking status: Former    Packs/day: 1.00    Years: 25.00    Total pack years: 25.00    Types: Cigarettes    Quit date: 09/06/2000    Years since quitting: 21.9    Passive exposure: Current (husband smokes inside)   Smokeless tobacco: Never   Tobacco comments:    Smoking cessation materials not required  Substance Use Topics   Alcohol use: No    Alcohol/week: 0.0 standard drinks of alcohol     Current Outpatient Medications:    acetaminophen (TYLENOL) 500 MG tablet, Take 500 mg by mouth every 6  (six) hours as needed for moderate pain., Disp: , Rfl:    aspirin EC 81 MG tablet, Take 1 tablet by mouth daily., Disp: , Rfl:    atorvastatin (LIPITOR) 40 MG tablet, Take 1 tablet (40 mg total) by mouth daily., Disp: 90 tablet, Rfl: 1   Cholecalciferol (VITAMIN D3) 2000 UNITS capsule, Take 1 capsule by mouth daily., Disp: , Rfl:    empagliflozin (JARDIANCE) 25 MG TABS tablet, Take 1 tablet (25 mg total) by mouth daily., Disp: 90 tablet, Rfl: 3   meloxicam (MOBIC) 15 MG tablet, Take 0.5-1 tablets (7.5-15 mg total) by mouth daily., Disp: 30 tablet, Rfl: 1   Multiple Vitamins-Minerals (WOMENS MULTIVITAMIN PO), Take 1 tablet by mouth. , Disp: , Rfl:    sacubitril-valsartan (ENTRESTO) 24-26 MG, Take 1 tablet by mouth 2 (two) times daily., Disp: , Rfl:    carvedilol (COREG) 3.125 MG tablet, Take 1 tablet by mouth 2 (two) times a day., Disp: , Rfl:    spironolactone (ALDACTONE) 25 MG tablet, Take 12.5 mg by mouth daily., Disp: , Rfl:   Allergies  Allergen Reactions   Metoprolol Other (See Comments)    severe bradycardia   Pantoprazole Rash   Sulfa Antibiotics Rash and Hives    I personally reviewed active problem list, medication list, allergies, family history, social history, health maintenance with the patient/caregiver today.   ROS  Constitutional: Negative for fever, positive for  weight change.  Respiratory: Negative for cough and shortness of breath.   Cardiovascular: Negative for chest pain or palpitations.  Gastrointestinal: Negative for abdominal pain, no bowel changes.  Musculoskeletal: positive for gait problem , negative  for joint swelling.  Skin: Negative for rash.  Neurological: Negative for dizziness or headache.  No other specific complaints in a complete review of systems (except as listed in HPI above).   Objective  Vitals:   08/15/22 0828  BP: 130/70  Pulse: 91  Resp: 16  SpO2: 98%  Weight: 231 lb (104.8 kg)  Height: 5\' 1"  (1.549 m)    Body mass index is  43.65 kg/m.  Physical Exam  Constitutional: Patient appears well-developed and well-nourished. Obese  No distress.  HEENT: head atraumatic, normocephalic, pupils equal and reactive to light, neck supple Cardiovascular: Normal rate, regular rhythm and normal heart sounds.  No murmur heard. No BLE edema. Pulmonary/Chest: Effort normal and breath sounds normal. No respiratory distress. Abdominal: Soft.  There is no tenderness. Psychiatric: Patient has a normal mood and affect. behavior is normal. Judgment and thought content normal.   Recent Results (from the past 2160 hour(s))  POCT HgB A1C     Status: Abnormal   Collection Time: 08/15/22  8:31 AM  Result Value Ref Range   Hemoglobin A1C 6.0 (A) 4.0 - 5.6 %   HbA1c POC (<> result, manual entry)     HbA1c, POC (prediabetic range)  HbA1c, POC (controlled diabetic range)       PHQ2/9:    08/15/2022    8:29 AM 08/02/2022    1:44 PM 04/12/2022    8:33 AM 02/13/2022    8:38 AM 11/09/2021    9:43 AM  Depression screen PHQ 2/9  Decreased Interest 0 0 0 0 0  Down, Depressed, Hopeless 0 0 0 0 0  PHQ - 2 Score 0 0 0 0 0  Altered sleeping 0 0  0 0  Tired, decreased energy 0 0  0 0  Change in appetite 0 0  0 0  Feeling bad or failure about yourself  0 0  0 0  Trouble concentrating 0 0  0 0  Moving slowly or fidgety/restless 0 0  0 0  Suicidal thoughts 0 0  0 0  PHQ-9 Score 0 0  0 0  Difficult doing work/chores  Not difficult at all       phq 9 is negative   Fall Risk:    08/15/2022    8:29 AM 08/02/2022    1:44 PM 04/12/2022    8:34 AM 02/13/2022    8:38 AM 11/09/2021    9:42 AM  Fall Risk   Falls in the past year? 0 0 0 0 0  Number falls in past yr: 0 0 0 0 0  Injury with Fall? 0 0 0 0 0  Risk for fall due to : No Fall Risks No Fall Risks No Fall Risks No Fall Risks No Fall Risks  Follow up Falls prevention discussed Falls prevention discussed;Education provided Falls prevention discussed Falls prevention discussed Falls  prevention discussed      Functional Status Survey: Is the patient deaf or have difficulty hearing?: No Does the patient have difficulty seeing, even when wearing glasses/contacts?: No Does the patient have difficulty concentrating, remembering, or making decisions?: No Does the patient have difficulty walking or climbing stairs?: Yes Does the patient have difficulty dressing or bathing?: No Does the patient have difficulty doing errands alone such as visiting a doctor's office or shopping?: No    Assessment & Plan  1. Dyslipidemia associated with type 2 diabetes mellitus (HCC)  - POCT HgB A1C - Urine Microalbumin w/creat. ratio - COMPLETE METABOLIC PANEL WITH GFR - Lipid panel - atorvastatin (LIPITOR) 40 MG tablet; Take 1 tablet (40 mg total) by mouth daily.  Dispense: 90 tablet; Refill: 1  2. Morbid obesity (HCC)  She lost 10 lbs recently, she is trying to lose weight   3. Hyperparathyroidism (HCC)  She is going to see Endo soon   4. Ischemic dilated cardiomyopathy (HCC)  Under the care of Dr. Gwen Pounds, compliant with her medications and seems to be doing well   5. Need for immunization against influenza  - Flu Vaccine QUAD High Dose(Fluad)  6. CAD S/P percutaneous coronary angioplasty   7. Primary osteoarthritis of both knees  Explained that steroid injection given at the ortho yesterday will likely elevate her glucose, to be mindful about her diet - avoid carbohydrates and try to check glucose at home  8. Essential (primary) hypertension  At goal   9. Gastroesophageal reflux disease without esophagitis   10. Vitamin D deficiency

## 2022-08-15 ENCOUNTER — Ambulatory Visit (INDEPENDENT_AMBULATORY_CARE_PROVIDER_SITE_OTHER): Payer: Medicare Other | Admitting: Family Medicine

## 2022-08-15 ENCOUNTER — Encounter: Payer: Self-pay | Admitting: Family Medicine

## 2022-08-15 ENCOUNTER — Other Ambulatory Visit: Payer: Self-pay | Admitting: Family Medicine

## 2022-08-15 VITALS — BP 130/70 | HR 91 | Resp 16 | Ht 61.0 in | Wt 231.0 lb

## 2022-08-15 DIAGNOSIS — I420 Dilated cardiomyopathy: Secondary | ICD-10-CM

## 2022-08-15 DIAGNOSIS — Z9861 Coronary angioplasty status: Secondary | ICD-10-CM

## 2022-08-15 DIAGNOSIS — E1169 Type 2 diabetes mellitus with other specified complication: Secondary | ICD-10-CM | POA: Diagnosis not present

## 2022-08-15 DIAGNOSIS — E669 Obesity, unspecified: Secondary | ICD-10-CM

## 2022-08-15 DIAGNOSIS — Z23 Encounter for immunization: Secondary | ICD-10-CM

## 2022-08-15 DIAGNOSIS — I10 Essential (primary) hypertension: Secondary | ICD-10-CM

## 2022-08-15 DIAGNOSIS — E559 Vitamin D deficiency, unspecified: Secondary | ICD-10-CM

## 2022-08-15 DIAGNOSIS — I255 Ischemic cardiomyopathy: Secondary | ICD-10-CM | POA: Diagnosis not present

## 2022-08-15 DIAGNOSIS — E213 Hyperparathyroidism, unspecified: Secondary | ICD-10-CM | POA: Diagnosis not present

## 2022-08-15 DIAGNOSIS — I2510 Atherosclerotic heart disease of native coronary artery without angina pectoris: Secondary | ICD-10-CM

## 2022-08-15 DIAGNOSIS — M170 Bilateral primary osteoarthritis of knee: Secondary | ICD-10-CM

## 2022-08-15 DIAGNOSIS — K219 Gastro-esophageal reflux disease without esophagitis: Secondary | ICD-10-CM

## 2022-08-15 DIAGNOSIS — E785 Hyperlipidemia, unspecified: Secondary | ICD-10-CM

## 2022-08-15 LAB — POCT GLYCOSYLATED HEMOGLOBIN (HGB A1C): Hemoglobin A1C: 6 % — AB (ref 4.0–5.6)

## 2022-08-15 MED ORDER — ATORVASTATIN CALCIUM 40 MG PO TABS: 40.0000 mg | ORAL_TABLET | Freq: Every day | ORAL | 1 refills | Status: DC

## 2022-08-16 LAB — COMPLETE METABOLIC PANEL WITH GFR
AG Ratio: 1.2 (calc) (ref 1.0–2.5)
ALT: 39 U/L — ABNORMAL HIGH (ref 6–29)
AST: 32 U/L (ref 10–35)
Albumin: 4.6 g/dL (ref 3.6–5.1)
Alkaline phosphatase (APISO): 89 U/L (ref 37–153)
BUN: 17 mg/dL (ref 7–25)
CO2: 24 mmol/L (ref 20–32)
Calcium: 11 mg/dL — ABNORMAL HIGH (ref 8.6–10.4)
Chloride: 105 mmol/L (ref 98–110)
Creat: 0.96 mg/dL (ref 0.50–1.05)
Globulin: 3.7 g/dL (calc) (ref 1.9–3.7)
Glucose, Bld: 140 mg/dL — ABNORMAL HIGH (ref 65–99)
Potassium: 4.7 mmol/L (ref 3.5–5.3)
Sodium: 138 mmol/L (ref 135–146)
Total Bilirubin: 0.3 mg/dL (ref 0.2–1.2)
Total Protein: 8.3 g/dL — ABNORMAL HIGH (ref 6.1–8.1)
eGFR: 65 mL/min/{1.73_m2} (ref >=60)

## 2022-08-16 LAB — LIPID PANEL
Cholesterol: 205 mg/dL — ABNORMAL HIGH (ref <200)
HDL: 50 mg/dL (ref >=50)
LDL Cholesterol (Calc): 133 mg/dL (calc) — ABNORMAL HIGH
Non-HDL Cholesterol (Calc): 155 mg/dL (calc) — ABNORMAL HIGH (ref <130)
Total CHOL/HDL Ratio: 4.1 (calc) (ref <5.0)
Triglycerides: 108 mg/dL (ref <150)

## 2022-08-16 LAB — MICROALBUMIN / CREATININE URINE RATIO
Creatinine, Urine: 53 mg/dL (ref 20–275)
Microalb Creat Ratio: 104 mcg/mg creat — ABNORMAL HIGH (ref <30)
Microalb, Ur: 5.5 mg/dL

## 2022-09-24 ENCOUNTER — Other Ambulatory Visit: Payer: Self-pay | Admitting: Internal Medicine

## 2022-09-24 DIAGNOSIS — E210 Primary hyperparathyroidism: Secondary | ICD-10-CM

## 2022-09-28 ENCOUNTER — Ambulatory Visit
Admission: RE | Admit: 2022-09-28 | Discharge: 2022-09-28 | Disposition: A | Discharge status: Home / Self Care | Payer: Medicare Other | Source: Ambulatory Visit | Attending: Internal Medicine | Admitting: Internal Medicine

## 2022-09-28 DIAGNOSIS — E210 Primary hyperparathyroidism: Secondary | ICD-10-CM | POA: Insufficient documentation

## 2022-10-02 ENCOUNTER — Other Ambulatory Visit: Payer: Self-pay | Admitting: Family Medicine

## 2022-10-02 DIAGNOSIS — M25561 Pain in right knee: Secondary | ICD-10-CM

## 2022-10-10 ENCOUNTER — Telehealth: Payer: Medicare Other

## 2022-10-10 NOTE — Progress Notes (Deleted)
Chronic Care Management Pharmacy Note  10/10/2022 Name:  [name] MRN:  [id number] DOB:  [birth year]  Summary: Patient presents for CCM follow-up.  Recommendations/Changes made from today's visit: Continue current medications  Plan: CPP follow-up 6 months   Subjective: Kathy Price is an 66 y.o. year old female who is a primary patient of Kathy Sizer, MD.  The CCM team was consulted for assistance with disease management and care coordination needs.    Engaged with patient by telephone for follow up visit in response to provider referral for pharmacy case management and/or care coordination services.   Consent to Services:  The patient was given information about Chronic Care Management services, agreed to services, and gave verbal consent prior to initiation of services.  Please see initial visit note for detailed documentation.   Patient Care Team: Kathy Sizer, MD as PCP - General (Family Medicine) Kathy Price, [facility] (Pharmacist) Kathy Skains, MD as Consulting Physician (Cardiology)  Recent office visits: 08/15/22: Patient presented to Dr. Ancil Boozer for follow-up.  08/02/22: Patient presented to Delsa Grana, PA-C for knee pain.  Recent consult visits: 04/25/22: Patient presented to Kathy Price, [place] (Cardiology) for follow-up.  04/09/22: Patient presented to Kathy Price, [place] (Cardiology) for follow-up.  Hospital visits: None in previous 6 months   Objective:  Lab Results  Component Value Date   CREATININE 0.96 08/15/2022   BUN 17 08/15/2022   EGFR 65 08/15/2022   GFRNONAA 64 02/16/2020   GFRAA 75 02/16/2020   NA 138 08/15/2022   K 4.7 08/15/2022   CALCIUM 11.0 (H) 08/15/2022   CO2 24 08/15/2022   GLUCOSE 140 (H) 08/15/2022    Lab Results  Component Value Date/Time   HGBA1C 6.0 (A) 08/15/2022 08:31 AM   HGBA1C 6.3 (H) 02/13/2022 09:13 AM   HGBA1C 6.3 (A) 08/14/2021 09:06 AM   HGBA1C 6.2 02/01/2020 11:30 AM   HGBA1C 6.2 07/17/2019  08:47 AM   MICROALBUR 5.5 08/15/2022 09:08 AM   MICROALBUR 1.7 08/14/2021 09:41 AM   MICROALBUR 100 08/03/2020 10:33 AM   MICROALBUR 20 01/15/2018 12:34 PM    Last diabetic Eye exam:  Lab Results  Component Value Date/Time   HMDIABEYEEXA No Retinopathy 05/05/2021 12:00 AM    Last diabetic Foot exam: No results found for: "HMDIABFOOTEX"   Lab Results  Component Value Date   CHOL 205 (H) 08/15/2022   HDL 50 08/15/2022   LDLCALC 133 (H) 08/15/2022   TRIG 108 08/15/2022   CHOLHDL 4.1 08/15/2022       Latest Ref Rng & Units 08/15/2022    9:08 AM 08/14/2021    9:41 AM 02/16/2020    8:30 AM  Hepatic Function  Total Protein 6.1 - 8.1 g/dL 8.3  7.3  6.9   AST 10 - 35 U/L 32  12  14   ALT 6 - 29 U/L 39  9  14   Total Bilirubin 0.2 - 1.2 mg/dL 0.3  0.4  0.4     No results found for: "TSH", "FREET4"     Latest Ref Rng & Units 08/14/2021    9:41 AM 02/16/2020    8:30 AM 01/20/2018    8:14 AM  CBC  WBC 3.8 - 10.8 Thousand/uL 6.4  6.9  6.5   Hemoglobin 11.7 - 15.5 g/dL 13.3  13.2  12.7   Hematocrit 35.0 - 45.0 % 40.0  40.2  37.0   Platelets 140 - 400 Thousand/uL 258  266  251  Lab Results  Component Value Date/Time   VD25OH 38 01/20/2018 08:14 AM   VD25OH 25 (L) 09/25/2016 09:15 AM    Clinical ASCVD: No  The 10-year ASCVD risk score (Arnett DK, et al., 2019) is: 24.2%   Values used to calculate the score:     Age: 34 years     Sex: Female     Is Non-Hispanic African American: Yes     Diabetic: Yes     Tobacco smoker: No     Systolic Blood Pressure: 449 mmHg     Is BP treated: Yes     HDL Cholesterol: 50 mg/dL     Total Cholesterol: 205 mg/dL       08/15/2022    8:29 AM 08/02/2022    1:44 PM 04/12/2022    8:33 AM  Depression screen PHQ 2/9  Decreased Interest 0 0 0  Down, Depressed, Hopeless 0 0 0  PHQ - 2 Score 0 0 0  Altered sleeping 0 0   Tired, decreased energy 0 0   Change in appetite 0 0   Feeling bad or failure about yourself  0 0   Trouble  concentrating 0 0   Moving slowly or fidgety/restless 0 0   Suicidal thoughts 0 0   PHQ-9 Score 0 0   Difficult doing work/chores  Not difficult at all     Social History   Tobacco Use  Smoking Status Former   Packs/day: 1.00   Years: 25.00   Total pack years: 25.00   Types: Cigarettes   Quit date: 09/06/2000   Years since quitting: 22.1   Passive exposure: Current (husband smokes inside)  Smokeless Tobacco Never  Tobacco Comments   Smoking cessation materials not required   BP Readings from Last 3 Encounters:  08/15/22 130/70  08/02/22 128/76  04/12/22 128/78   Pulse Readings from Last 3 Encounters:  08/15/22 91  08/02/22 83  04/12/22 71   Wt Readings from Last 3 Encounters:  08/15/22 231 lb (104.8 kg)  08/02/22 242 lb 3.2 oz (109.9 kg)  04/12/22 240 lb 1.6 oz (108.9 kg)   BMI Readings from Last 3 Encounters:  08/15/22 43.65 kg/m  08/02/22 44.30 kg/m  04/12/22 43.91 kg/m    Assessment/Interventions: Review of patient past medical history, allergies, medications, health status, including review of consultants reports, laboratory and other test data, was performed as part of comprehensive evaluation and provision of chronic care management services.   SDOH:  (Social Determinants of Health) assessments and interventions performed: Yes SDOH Interventions    Flowsheet Row Clinical Support from 04/12/2022 in [place] Management from 12/13/2021 in [place] Management from 11/03/2020 in [place] Interventions Intervention Not Indicated -- --  Housing Interventions Intervention Not Indicated -- --  Transportation Interventions Intervention Not Indicated -- Intervention Not Indicated  Financial Strain Interventions -- Intervention Not Indicated Other (Comment)  [Will start PAP]  Physical Activity Interventions Intervention Not Indicated -- --   Stress Interventions Intervention Not Indicated -- --  Social Connections Interventions Intervention Not Indicated -- --       SDOH Screenings   Food Insecurity: No Food Insecurity (04/12/2022)  Housing: Low Risk  (04/12/2022)  Transportation Needs: No Transportation Needs (04/12/2022)  Alcohol Screen: Low Risk  (04/12/2022)  Depression (PHQ2-9): Low Risk  (08/15/2022)  Financial Resource Strain: Low Risk  (04/12/2022)  Physical Activity: Insufficiently Active (04/12/2022)  Social Connections: Socially Integrated (04/12/2022)  Stress: No Stress Concern Present (04/12/2022)  Tobacco Use: Medium Risk (08/15/2022)    CCM Care Plan  Allergies  Allergen Reactions   Metoprolol Other (See Comments)    severe bradycardia   Pantoprazole Rash   Sulfa Antibiotics Rash and Hives    Medications Reviewed Today     Reviewed by Carlene Coria, CMA (Certified Medical Assistant) on 08/15/22 at [place] List Status: <None>   Medication Order Taking? Sig Documenting Provider Last Dose Status Informant  acetaminophen (TYLENOL) 500 MG tablet [id number] Yes Take 500 mg by mouth every 6 (six) hours as needed for moderate pain. [provider] Taking Active   aspirin EC 81 MG tablet [id number] Yes Take 1 tablet by mouth daily. [provider] Taking Active Self           Med Note Windle Guard, JENNIFER L   Tue May 15, 2016 10:41 AM)    atorvastatin (LIPITOR) 40 MG tablet [id number] Yes Take 1 tablet (40 mg total) by mouth daily. Kathy Sizer, MD Taking Active   carvedilol (COREG) 3.125 MG tablet [id number]  Take 1 tablet by mouth 2 (two) times a day. Ralene Bathe, MD  Expired 12/13/21 2359   Cholecalciferol (VITAMIN D3) 2000 UNITS capsule [id number] Yes Take 1 capsule by mouth daily. [provider] Taking Active Self           Med Note Windle Guard, JENNIFER L   Tue May 15, 2016 10:41 AM)    empagliflozin (JARDIANCE) 25 MG TABS tablet [id number] Yes Take 1 tablet (25 mg total) by  mouth daily. Kathy Sizer, MD Taking Active   meloxicam [facility]) 15 MG tablet [id number] Yes Take 0.5-1 tablets (7.5-15 mg total) by mouth daily. Delsa Grana, PA-C Taking Active   Multiple Vitamins-Minerals (WOMENS MULTIVITAMIN PO) [id number] Yes Take 1 tablet by mouth.  [provider] Taking Active Self  sacubitril-valsartan (ENTRESTO) 24-26 MG [id number] Yes Take 1 tablet by mouth 2 (two) times daily. Ralene Bathe, MD Taking Active   spironolactone (ALDACTONE) 25 MG tablet [id number]  Take 12.5 mg by mouth daily. [provider]  Expired 12/13/21 2359             Patient Active Problem List   Diagnosis Date Noted   Primary osteoarthritis of both knees 08/15/2022   Hyperparathyroidism ([place]) 96/28/3662   Helicobacter pylori gastritis 11/06/2016   Ischemic dilated cardiomyopathy ([place]) 01/30/2016   CAD S/P percutaneous coronary angioplasty 09/07/2015   Dyslipidemia associated with type 2 diabetes mellitus ([place]) 09/07/2015   Dyslipidemia 09/07/2015   H/O: hysterectomy 09/07/2015   H/O acute myocardial infarction 09/07/2015   Vitamin D deficiency 09/07/2015   Morbid obesity ([place]) 09/07/2015   Bradycardia 05/13/2014   Arteriosclerosis of coronary artery 05/13/2014   Essential (primary) hypertension 05/13/2014   UARS (upper airway resistance syndrome) 01/23/2013    Immunization History  Administered Date(s) Administered   Fluad Quad(high Dose 65+) 08/03/2020, 08/14/2021, 08/15/2022   Influenza Inj Mdck Quad Pf 07/17/2019   Influenza, Seasonal, Injecte, Preservative Fre 07/29/2012, 06/29/2014   Influenza,inj,Quad PF,6+ Mos 09/29/2013, 06/28/2017, 08/28/2018   Influenza-Unspecified 06/29/2014, 08/25/2015, 08/16/2016   Moderna Sars-Covid-2 Vaccination 12/26/2019, 01/23/2020   PNEUMOCOCCAL CONJUGATE-20 09/15/2021   Pneumococcal Conjugate-13 04/07/2010, 04/05/2020   Pneumococcal Polysaccharide-23 04/07/2010, 12/30/2014, 09/07/2015   Td 04/07/2010   Tdap  04/07/2010, 11/15/2020   Zoster, Live 09/25/2016    Conditions to be addressed/monitored:  Hypertension, Hyperlipidemia, Diabetes, and Heart Failure  There are no care plans that  you recently modified to display for this patient.     Medication Assistance:  Jardiance obtained through [employer] medication assistance program.  Enrollment ends Dec 2023  Delene Loll obtained through [employer] medication assistance program.  Enrollment ends Dec 2023  Compliance/Adherence/Medication fill history: Care Gaps: Zoster Vaccine COVID-19 Booster 3  Star-Rating Drugs: Atorvastatin 40 mg last filled on 09/16/2021 for a 90-Day supply with [facility] Jardiance 25 mg patient receives this medication through [place] patient assistance Sacubitril-Valsartan 24-26 mg patient receives this medication through [employer] patient assistance    Patient's preferred pharmacy is:  [id number] [place] [place] [address] [place]Phone: [phone]: [phone] [place] - [address]Phone: [phone]: [phone] Uses pill box? Yes Pt endorses 100% compliance  We discussed: Current pharmacy is preferred with insurance plan and patient is satisfied with pharmacy services Patient decided to: Continue current medication management strategy  Care Plan and Follow Up Patient Decision:  Patient agrees to Care Plan and Follow-up.  Plan: Telephone follow up appointment with care management team member scheduled for:  10/10/2022 at 8:30 AM  AMalva Limes CKnottPharmacist Practitioner  [id number] Current Barriers:  No barriers noted  Pharmacist Clinical Goal(s):  Patient will maintain control of diabetes as evidenced by A1c less than 7%  through collaboration with PharmD and provider.   Interventions: 1:1  collaboration with SSteele Sizer MD regarding development and update of comprehensive plan of care as evidenced by provider attestation and co-signature Inter-disciplinary care team collaboration (see longitudinal plan of care) Comprehensive medication review performed; medication list updated in electronic medical record  Diabetes (A1c goal <7%) -Controlled -Current medications: Jardiance 25 mg daily: Appropriate, Effective, Safe, Accessible  -Medications previously tried: NA  -Current home glucose readings fasting glucose: NA -Denies hypoglycemic/hyperglycemic symptoms -Recommended to continue current medication  Heart Failure (Goal: manage symptoms and prevent exacerbations) -Controlled -Last ejection fraction: 30-35% (Date: 2019) -HF type: Systolic -NYHA Class: II (slight limitation of activity) -Current treatment: Carvedilol 3.125 mg twice daily: Appropriate, Effective, Safe, Accessible Entresto 24-26 mg daily: Appropriate, Effective, Safe, Accessible  Spironolactone 1/2 tablet daily: Appropriate, Effective, Safe, Accessible  -Medications previously tried: NA  -Current home BP/HR readings: 120/79, 135/70. Pulse in the 80s.  -Current weight has been fluctuating, lost a couple of pounds over last few weeks. Does monitor weight daily and monitors for signs of swelling. -Recommended to continue current medication  Hyperlipidemia: (LDL goal < 70) -Uncontrolled -S/p MI and stent placement 2002.  -Current treatment: Atovastatin 40 mg daily: Appropriate, Effective, Safe, Accessible  -Medications previously tried: NA  -Wasn't taking atorvastatin every day due to concerns that it was worsening joint pain. Resumed taking daily after last labwork, and joint pain has not worsened.  -Recommended to continue current medication  Patient Goals/Self-Care Activities Patient will:  - check glucose daily, document, and provide at future appointments check blood pressure 2-3 times weekly,  document, and provide at future appointments  Follow Up Plan: ***

## 2022-10-16 ENCOUNTER — Other Ambulatory Visit: Payer: Self-pay | Admitting: Internal Medicine

## 2022-10-16 DIAGNOSIS — E210 Primary hyperparathyroidism: Secondary | ICD-10-CM

## 2022-10-25 ENCOUNTER — Encounter: Payer: Self-pay | Admitting: Family Medicine

## 2022-10-25 ENCOUNTER — Ambulatory Visit: Payer: Self-pay

## 2022-10-25 ENCOUNTER — Telehealth (INDEPENDENT_AMBULATORY_CARE_PROVIDER_SITE_OTHER): Payer: Medicare Other | Admitting: Family Medicine

## 2022-10-25 VITALS — Ht 61.0 in | Wt 231.0 lb

## 2022-10-25 DIAGNOSIS — G478 Other sleep disorders: Secondary | ICD-10-CM

## 2022-10-25 DIAGNOSIS — E1129 Type 2 diabetes mellitus with other diabetic kidney complication: Secondary | ICD-10-CM

## 2022-10-25 DIAGNOSIS — J069 Acute upper respiratory infection, unspecified: Secondary | ICD-10-CM

## 2022-10-25 DIAGNOSIS — U071 COVID-19: Secondary | ICD-10-CM

## 2022-10-25 DIAGNOSIS — I152 Hypertension secondary to endocrine disorders: Secondary | ICD-10-CM | POA: Diagnosis not present

## 2022-10-25 DIAGNOSIS — R809 Proteinuria, unspecified: Secondary | ICD-10-CM

## 2022-10-25 DIAGNOSIS — E1159 Type 2 diabetes mellitus with other circulatory complications: Secondary | ICD-10-CM

## 2022-10-25 DIAGNOSIS — I429 Cardiomyopathy, unspecified: Secondary | ICD-10-CM

## 2022-10-25 DIAGNOSIS — E785 Hyperlipidemia, unspecified: Secondary | ICD-10-CM

## 2022-10-25 MED ORDER — NIRMATRELVIR/RITONAVIR (PAXLOVID)TABLET: 3.0000 | ORAL_TABLET | Freq: Two times a day (BID) | ORAL | 0 refills | Status: AC

## 2022-10-25 NOTE — Progress Notes (Signed)
Name: Kathy Price   MRN: [id number]    DOB: 1956/10/04   Date:10/25/2022       Progress Note  Subjective:    Chief Complaint  Chief Complaint  Patient presents with   Covid Positive    Symptoms started yesterday, tested positive w/ home test today.    I connected with  [name]  on 10/25/22 at  2:00 PM EST by a video enabled telemedicine application and verified that I am speaking with the correct person using two identifiers.  I discussed the limitations of evaluation and management by telemedicine and the availability of in person appointments. The patient expressed understanding and agreed to proceed. Staff also discussed with the patient that there may be a patient responsible charge related to this service. Patient Location: home  Provider Location: [facility]  Additional Individuals present: none  HPI COVID + with onset of sx and positive test yesterday Current complaints:  tired fever, diarrhea, HA, cough, some nasal symptoms She's pushing fluids, but not eating No abd pain Diarrhea - loose only 2 episodes Not checking sugars or BP No CP, SOB, wheeze, near syncope, exertional sx, N/V   Last eGFR in October 65  Patient Active Problem List   Diagnosis Date Noted   Primary osteoarthritis of both knees 08/15/2022   Hyperparathyroidism ([place]) 68/08/5725   Helicobacter pylori gastritis 11/06/2016   Ischemic dilated cardiomyopathy ([place]) 01/30/2016   CAD S/P percutaneous coronary angioplasty 09/07/2015   Dyslipidemia associated with type 2 diabetes mellitus ([place]) 09/07/2015   Dyslipidemia 09/07/2015   H/O: hysterectomy 09/07/2015   H/O acute myocardial infarction 09/07/2015   Vitamin D deficiency 09/07/2015   Morbid obesity ([place]) 09/07/2015   Bradycardia 05/13/2014   Arteriosclerosis of coronary artery 05/13/2014   Essential (primary) hypertension 05/13/2014   UARS (upper airway resistance syndrome) 01/23/2013    Social History   Tobacco Use   Smoking status:  Former    Packs/day: 1.00    Years: 25.00    Total pack years: 25.00    Types: Cigarettes    Quit date: 09/06/2000    Years since quitting: 22.1    Passive exposure: Current (husband smokes inside)   Smokeless tobacco: Never   Tobacco comments:    Smoking cessation materials not required  Substance Use Topics   Alcohol use: No    Alcohol/week: 0.0 standard drinks of alcohol     Current Outpatient Medications:    acetaminophen (TYLENOL) 500 MG tablet, Take 500 mg by mouth every 6 (six) hours as needed for moderate pain., Disp: , Rfl:    aspirin EC 81 MG tablet, Take 1 tablet by mouth daily., Disp: , Rfl:    atorvastatin (LIPITOR) 40 MG tablet, Take 1 tablet (40 mg total) by mouth daily., Disp: 90 tablet, Rfl: 1   Cholecalciferol (VITAMIN D3) 2000 UNITS capsule, Take 1 capsule by mouth daily., Disp: , Rfl:    empagliflozin (JARDIANCE) 25 MG TABS tablet, Take 1 tablet (25 mg total) by mouth daily., Disp: 90 tablet, Rfl: 3   meloxicam (MOBIC) 15 MG tablet, TAKE 1/2 TO 1 TABLET (7.5-15 MG TOTAL) BY MOUTH DAILY, Disp: 30 tablet, Rfl: 1   Multiple Vitamins-Minerals (WOMENS MULTIVITAMIN PO), Take 1 tablet by mouth. , Disp: , Rfl:    sacubitril-valsartan (ENTRESTO) 24-26 MG, Take 1 tablet by mouth 2 (two) times daily., Disp: , Rfl:    carvedilol (COREG) 3.125 MG tablet, Take 1 tablet by mouth 2 (two) times a day., Disp: , Rfl:  spironolactone (ALDACTONE) 25 MG tablet, Take 12.5 mg by mouth daily., Disp: , Rfl:   Allergies  Allergen Reactions   Metoprolol Other (See Comments)    severe bradycardia   Pantoprazole Rash   Sulfa Antibiotics Rash and Hives    I personally reviewed active problem list, medication list, allergies, family history, social history, health maintenance, notes from last encounter, lab results, imaging with the patient/caregiver today.   Review of Systems  Constitutional: Negative.   HENT: Negative.    Eyes: Negative.   Respiratory: Negative.    Cardiovascular:  Negative.   Gastrointestinal: Negative.   Endocrine: Negative.   Genitourinary: Negative.   Musculoskeletal: Negative.   Skin: Negative.   Allergic/Immunologic: Negative.   Neurological: Negative.   Hematological: Negative.   Psychiatric/Behavioral: Negative.    All other systems reviewed and are negative.     Objective:   Virtual encounter, vitals limited, only able to obtain the following Today's Vitals   10/25/22 1216  Weight: 231 lb (104.8 kg)  Height: _0  (1.549 m)   Body mass index is 43.65 kg/m. Nursing Note and Vital Signs reviewed.  Physical Exam Vitals and nursing note reviewed.  Constitutional:      General: She is not in acute distress.    Appearance: She is obese. She is not toxic-appearing or diaphoretic.     Comments: Appears a little tired, but non-toxic, NAD, nasal phonation/congested  Pulmonary:     Effort: No respiratory distress.     Comments: Speaking in full and complete sentences, no retractions, accessory muscle use, dypnea, throat clearing, no coughing Neurological:     Mental Status: She is alert.     PE limited by virtual encounter  No results found for this or any previous visit (from the past 72 hour(s)).  Assessment and Plan:     ICD-10-CM   1. Upper respiratory tract infection due to COVID-19 virus  U07.1 nirmatrelvir/ritonavir (PAXLOVID) 20 x 150 MG & 10 x 100MG TABS   J06.9    multiple comorbidities and risk factors including age, obesity, HTN, HLD, cardiomyopathy, pulm disease, T2DM  Discussed paxlovid- she does want to take it Encouraged her to use mucinex, an 2nd generation antihistamine, OTC cough meds like robitussin Tylenol for HA/fever/body aches Rest and push fluids Isolate x 5 d, today is day #1, can break strict isolation on 10/30/2022 if fever free - mask in public x 5 more days    2. Cardiomyopathy, unspecified type ([place])  I42.9    denies any current CP, SOB, near syncope, exertional sx    3. Hypertension  associated with type 2 diabetes mellitus ([place])  E11.59    I15.2    encouraged her to push fluids and monitor BP at home, if not eating drinking well or BP low, she may need to hold diuretic bp/heart meds    4. Controlled type 2 diabetes mellitus with microalbuminuria, without long-term current use of insulin (H[facility])  E11.29    R80.9    not checking sugars    5. Dyslipidemia  E78.5     6. UARS (upper airway resistance syndrome)  G47.8    denies any prior need for inhalers, currently mild nonproductive cough but not wheeze, chest tightness or SOB    7. Morbid obesity ([place])  E66.01    additional COVID risk with BMI 43+       -Red flags and when to present for emergency care or RTC including fever >101.48F, chest pain, shortness of breath, new/worsening/un-resolving symptoms,  reviewed with patient at time of visit. Follow up and care instructions discussed and provided in AVS. - I discussed the assessment and treatment plan with the patient. The patient was provided an opportunity to ask questions and all were answered. The patient agreed with the plan and demonstrated an understanding of the instructions.  I provided 15 minutes of non-face-to-face time during this encounter.  Delsa Grana, PA-C 10/25/22 12:43 PM

## 2022-10-25 NOTE — Telephone Encounter (Signed)
Patient called, left VM to return the call to the office to discuss symptoms with a nurse.  Summary: covid and paxlovid   Pt has covid and is asking for paxlovid / pt has sweats, headache/ they started yesterday / please advise

## 2022-10-25 NOTE — Telephone Encounter (Signed)
  Chief Complaint: Covid Positive Symptoms: Dry cough, scratchy throat, headache, body aches subjective fever Frequency: Onset yesterday Pertinent Negatives: Patient denies SOB Disposition: [] ED /[] Urgent Care (no appt availability in office) / [x] Appointment(In office/virtual)/ []  [place] [facility]/ [] Home Care/ [] Refused Recommended Disposition /[] [place] [facility]/ []  Follow-up with PCP Additional Notes: Pt interested in Paxlovid, virtual secured for this afternoon. Care advise provided as well as guidelines for self-isolation. Pt verbalizes understanding.  Reason for Disposition  [1] HIGH RISK patient (e.g., weak immune system, age > 64 years, obesity with BMI 30 or higher, pregnant, chronic lung disease or other chronic medical condition) AND [2] COVID symptoms (e.g., cough, fever)  (Exceptions: Already seen by PCP and no new or worsening symptoms.)  Answer Assessment - Initial Assessment Questions 1. COVID-19 DIAGNOSIS: "How do you know that you have COVID?" (e.g., positive lab test or self-test, diagnosed by doctor or NP/PA, symptoms after exposure).     Home test, today 2. COVID-19 EXPOSURE: "Was there any known exposure to COVID before the symptoms began?" CDC Definition of close contact: within 6 feet (2 meters) for a total of 15 minutes or more over a 24-hour period.      Pt's mother over weekend 3. ONSET: "When did the COVID-19 symptoms start?"      yesterday 4. WORST SYMPTOM: "What is your worst symptom?" (e.g., cough, fever, shortness of breath, muscle aches)     Cough 5. COUGH: "Do you have a cough?" If Yes, ask: "How bad is the cough?"       Dry cough 6. FEVER: "Do you have a fever?" If Yes, ask: "What is your temperature, how was it measured, and when did it start?"     "Feel chills" 7. RESPIRATORY STATUS: "Describe your breathing?" (e.g., normal; shortness of breath, wheezing, unable to speak)      WNL 8. BETTER-SAME-WORSE: "Are you getting better, staying the  same or getting worse compared to yesterday?"  If getting worse, ask, "In what way?"      9. OTHER SYMPTOMS: "Do you have any other symptoms?"  (e.g., chills, fatigue, headache, loss of smell or taste, muscle pain, sore throat)     Mild body aches, scratchy throat, stuffy nose, headache 10. HIGH RISK DISEASE: "Do you have any chronic medical problems?" (e.g., asthma, heart or lung disease, weak immune system, obesity, etc.)       Yes 11. VACCINE: "Have you had the COVID-19 vaccine?" If Yes, ask: "Which one, how many shots, when did you get it?"       All except the last booster  13. O2 SATURATION MONITOR:  "Do you use an oxygen saturation monitor (pulse oximeter) at home?" If Yes, ask "What is your reading (oxygen level) today?" "What is your usual oxygen saturation reading?" (e.g., 95%)       NA  Protocols used: Coronavirus (COVID-19) Diagnosed or Suspected-A-AH

## 2022-11-14 ENCOUNTER — Encounter
Admission: RE | Admit: 2022-11-14 | Discharge: 2022-11-14 | Disposition: A | Discharge status: Home / Self Care | Payer: Medicare Other | Source: Ambulatory Visit | Attending: Internal Medicine | Admitting: Internal Medicine

## 2022-11-14 ENCOUNTER — Telehealth: Payer: Medicare Other

## 2022-11-14 DIAGNOSIS — E210 Primary hyperparathyroidism: Secondary | ICD-10-CM | POA: Insufficient documentation

## 2022-11-14 DIAGNOSIS — D351 Benign neoplasm of parathyroid gland: Secondary | ICD-10-CM | POA: Diagnosis not present

## 2022-11-15 DIAGNOSIS — I2510 Atherosclerotic heart disease of native coronary artery without angina pectoris: Secondary | ICD-10-CM | POA: Diagnosis not present

## 2022-11-15 DIAGNOSIS — E210 Primary hyperparathyroidism: Secondary | ICD-10-CM | POA: Diagnosis not present

## 2022-12-11 ENCOUNTER — Other Ambulatory Visit: Payer: Self-pay | Admitting: Family Medicine

## 2022-12-11 DIAGNOSIS — M25561 Pain in right knee: Secondary | ICD-10-CM

## 2022-12-12 DIAGNOSIS — E782 Mixed hyperlipidemia: Secondary | ICD-10-CM | POA: Diagnosis not present

## 2022-12-12 DIAGNOSIS — I2510 Atherosclerotic heart disease of native coronary artery without angina pectoris: Secondary | ICD-10-CM | POA: Diagnosis not present

## 2022-12-12 DIAGNOSIS — I255 Ischemic cardiomyopathy: Secondary | ICD-10-CM | POA: Diagnosis not present

## 2022-12-12 DIAGNOSIS — I420 Dilated cardiomyopathy: Secondary | ICD-10-CM | POA: Diagnosis not present

## 2022-12-12 DIAGNOSIS — I10 Essential (primary) hypertension: Secondary | ICD-10-CM | POA: Diagnosis not present

## 2022-12-25 DIAGNOSIS — E210 Primary hyperparathyroidism: Secondary | ICD-10-CM | POA: Diagnosis not present

## 2022-12-25 NOTE — Progress Notes (Addendum)
Anesthesia Review:  PCP:  Etter Sjogren  LOV 08/25/22 [facility] visit 10/25/22  Cardiologist : DR Paraschos LVO 12/12/22  Chest x-ray : EKG : 12/31/22  Echo : Stress test: Cardiac Cath :  Activity level: can do a flight of stairs without difficutly  Sleep Study/ CPAP : none  Fasting Blood Sugar :      / Checks Blood Sugar -- times a day:   Blood Thinner/ Instructions /Last Dose: ASA / Instructions/ Last Dose :    81 mg aspirin    DM- type 2 does not check glucose at home  Jardiance - Last dose on 12/30/22 - Jessica Ward, PAC aware on 12/31/22.  HGba1c-  12/31/22 - 6.2    NO orders at preop requested on 12/31/22. Requested on 12/31/22. Spoke with Abigail Butts at [facility] in Triage.

## 2022-12-26 NOTE — Patient Instructions (Signed)
SURGICAL WAITING ROOM VISITATION  Patients having surgery or a procedure may have no more than 2 support people in the waiting area - these visitors may rotate.    Children under the age of 67 must have an adult with them who is not the patient.  Due to an increase in RSV and influenza rates and associated hospitalizations, children ages 22 and under may not visit patients in [place].  If the patient needs to stay at the hospital during part of their recovery, the visitor guidelines for inpatient rooms apply. Pre-op nurse will coordinate an appropriate time for 1 support person to accompany patient in pre-op.  This support person may not rotate.    Please refer to the [facility] website for the visitor guidelines for Inpatients (after your surgery is over and you are in a regular room).       Your procedure is scheduled on:  01/01/2023    Report to [facility] Main Entrance    Report to admitting at    0800AM   Call this number if you have problems the morning of surgery [phone]   Do not eat food  or drink liquids :After Midnight.                If you have questions, please contact your surgeon's office.    Oral Hygiene is also important to reduce your risk of infection.                                    Remember - BRUSH YOUR TEETH THE MORNING OF SURGERY WITH YOUR REGULAR TOOTHPASTE  DENTURES WILL BE REMOVED PRIOR TO SURGERY PLEASE DO NOT APPLY "Poly grip" OR ADHESIVES!!!   Do NOT smoke after Midnight   Take these medicines the morning of surgery with A SIP OF WATER:  coreg   DO NOT TAKE ANY ORAL DIABETIC MEDICATIONS DAY OF YOUR SURGERY  Bring CPAP mask and tubing day of surgery.                              You may not have any metal on your body including hair pins, jewelry, and body piercing             Do not wear make-up, lotions, powders, perfumes/cologne, or deodorant  Do not wear nail polish including gel and S&S, artificial/acrylic  nails, or any other type of covering on natural nails including finger and toenails. If you have artificial nails, gel coating, etc. that needs to be removed by a nail salon please have this removed prior to surgery or surgery may need to be canceled/ delayed if the surgeon/ anesthesia feels like they are unable to be safely monitored.   Do not shave  48 hours prior to surgery.               Men may shave face and neck.   Do not bring valuables to the hospital. [place].   Contacts, glasses, dentures or bridgework may not be worn into surgery.   Bring small overnight bag day of surgery.   DO NOT [place]. PHARMACY WILL DISPENSE MEDICATIONS LISTED ON YOUR MEDICATION LIST TO YOU DURING YOUR ADMISSION IN THE  HOSPITAL!    Patients discharged on the day of surgery will not be allowed to drive home.  Someone NEEDS to stay with you for the first 24 hours after anesthesia.   Special Instructions: Bring a copy of your healthcare power of attorney and living will documents the day of surgery if you haven't scanned them before.              Please read over the following fact sheets you were given: IF [place] [phone]   If you received a COVID test during your pre-op visit  it is requested that you wear a mask when out in public, stay away from anyone that may not be feeling well and notify your surgeon if you develop symptoms. If you test positive for Covid or have been in contact with anyone that has tested positive in the last 10 days please notify you surgeon.    [place] - Preparing for Surgery Before surgery, you can play an important role.  Because skin is not sterile, your skin needs to be as free of germs as possible.  You can reduce the number of germs on your skin by washing with CHG (chlorahexidine gluconate) soap before surgery.  CHG is an  antiseptic cleaner which kills germs and bonds with the skin to continue killing germs even after washing. Please DO NOT use if you have an allergy to CHG or antibacterial soaps.  If your skin becomes reddened/irritated stop using the CHG and inform your nurse when you arrive at Short Stay. Do not shave (including legs and underarms) for at least 48 hours prior to the first CHG shower.  You may shave your face/neck. Please follow these instructions carefully:  1.  Shower with CHG Soap the night before surgery and the  morning of Surgery.  2.  If you choose to wash your hair, wash your hair first as usual with your  normal  shampoo.  3.  After you shampoo, rinse your hair and body thoroughly to remove the  shampoo.                           4.  Use CHG as you would any other liquid soap.  You can apply chg directly  to the skin and wash                       Gently with a scrungie or clean washcloth.  5.  Apply the CHG Soap to your body ONLY FROM THE NECK DOWN.   Do not use on face/ open                           Wound or open sores. Avoid contact with eyes, ears mouth and genitals (private parts).                       Wash face,  Genitals (private parts) with your normal soap.             6.  Wash thoroughly, paying special attention to the area where your surgery  will be performed.  7.  Thoroughly rinse your body with warm water from the neck down.  8.  DO NOT shower/wash with your normal soap after using and rinsing off  the CHG Soap.  9.  Pat yourself dry with a clean towel.            10.  Wear clean pajamas.            11.  Place clean sheets on your bed the night of your first shower and do not  sleep with pets. Day of Surgery : Do not apply any lotions/deodorants the morning of surgery.  Please wear clean clothes to the hospital/surgery center.  FAILURE TO FOLLOW THESE INSTRUCTIONS MAY RESULT IN THE CANCELLATION OF YOUR SURGERY PATIENT  SIGNATURE_________________________________  NURSE SIGNATURE__________________________________  ________________________________________________________________________

## 2022-12-31 ENCOUNTER — Ambulatory Visit: Payer: Self-pay | Admitting: General Surgery

## 2022-12-31 ENCOUNTER — Other Ambulatory Visit: Payer: Self-pay

## 2022-12-31 ENCOUNTER — Encounter (HOSPITAL_COMMUNITY): Payer: Self-pay

## 2022-12-31 ENCOUNTER — Encounter (HOSPITAL_COMMUNITY)
Admission: RE | Admit: 2022-12-31 | Discharge: 2022-12-31 | Disposition: A | Discharge status: Home / Self Care | Payer: Medicare Other | Source: Ambulatory Visit | Attending: General Surgery | Admitting: General Surgery

## 2022-12-31 VITALS — BP 132/68 | HR 67 | Temp 98.4°F | Resp 16 | Ht 61.0 in | Wt 225.0 lb

## 2022-12-31 DIAGNOSIS — Z79899 Other long term (current) drug therapy: Secondary | ICD-10-CM | POA: Diagnosis not present

## 2022-12-31 DIAGNOSIS — R9431 Abnormal electrocardiogram [ECG] [EKG]: Secondary | ICD-10-CM | POA: Diagnosis not present

## 2022-12-31 DIAGNOSIS — Z01818 Encounter for other preprocedural examination: Secondary | ICD-10-CM | POA: Insufficient documentation

## 2022-12-31 HISTORY — DX: Essential (primary) hypertension: I10

## 2022-12-31 HISTORY — DX: Unspecified osteoarthritis, unspecified site: M19.90

## 2022-12-31 LAB — BASIC METABOLIC PANEL
Anion gap: 10 (ref 5–15)
BUN: 15 mg/dL (ref 8–23)
CO2: 25 mmol/L (ref 22–32)
Calcium: 10 mg/dL (ref 8.9–10.3)
Chloride: 106 mmol/L (ref 98–111)
Creatinine, Ser: 1.02 mg/dL — ABNORMAL HIGH (ref 0.44–1.00)
GFR, Estimated: >60 mL/min (ref >60)
Glucose, Bld: 120 mg/dL — ABNORMAL HIGH (ref 70–99)
Potassium: 4.4 mmol/L (ref 3.5–5.1)
Sodium: 141 mmol/L (ref 135–145)

## 2022-12-31 LAB — CBC
HCT: 39.3 % (ref 36.0–46.0)
Hemoglobin: 13 g/dL (ref 12.0–15.0)
MCH: 28.4 pg (ref 26.0–34.0)
MCHC: 33.1 g/dL (ref 30.0–36.0)
MCV: 86 fL (ref 80.0–100.0)
Platelets: 247 10*3/uL (ref 150–400)
RBC: 4.57 MIL/uL (ref 3.87–5.11)
RDW: 14.2 % (ref 11.5–15.5)
WBC: 7 10*3/uL (ref 4.0–10.5)
nRBC: 0 % (ref 0.0–0.2)

## 2022-12-31 LAB — GLUCOSE, CAPILLARY: Glucose-Capillary: 118 mg/dL — ABNORMAL HIGH (ref 70–99)

## 2022-12-31 NOTE — Anesthesia Preprocedure Evaluation (Signed)
Anesthesia Evaluation  Patient identified by MRN, date of birth, ID band Patient awake    Reviewed: Allergy & Precautions, NPO status , Patient's Chart, lab work & pertinent test results, reviewed documented beta blocker date and time   Airway Mallampati: III  TM Distance: <3 FB Neck ROM: Full    Dental  (+) Edentulous Upper, Dental Advisory Given   Pulmonary former smoker   breath sounds clear to auscultation       Cardiovascular hypertension, Pt. on home beta blockers and Pt. on medications + CAD, + Past MI and + Cardiac Stents   Rhythm:Regular Rate:Normal     Neuro/Psych negative neurological ROS     GI/Hepatic negative GI ROS, Neg liver ROS,,,  Endo/Other  diabetes, Type 2, Oral Hypoglycemic Agents    Renal/GU negative Renal ROS     Musculoskeletal  (+) Arthritis ,    Abdominal   Peds  Hematology negative hematology ROS (+)   Anesthesia Other Findings   Reproductive/Obstetrics                             Anesthesia Physical Anesthesia Plan  ASA: 3  Anesthesia Plan: General   Post-op Pain Management: Tylenol PO (pre-op)*   Induction: Intravenous  PONV Risk Score and Plan: 4 or greater and Ondansetron, Dexamethasone, Midazolam and Scopolamine patch - Pre-op  Airway Management Planned: Oral ETT  Additional Equipment: None  Intra-op Plan:   Post-operative Plan: Extubation in OR  Informed Consent: I have reviewed the patients History and Physical, chart, labs and discussed the procedure including the risks, benefits and alternatives for the proposed anesthesia with the patient or authorized representative who has indicated his/her understanding and acceptance.     Dental advisory given  Plan Discussed with: CRNA  Anesthesia Plan Comments: (See PAT note 12/31/2022)       Anesthesia Quick Evaluation

## 2022-12-31 NOTE — Progress Notes (Signed)
Anesthesia Chart Review   Case: [id number] Date/Time: 01/01/23 0945   Procedure: PARATHYROIDECTOMY   Anesthesia type: General   Pre-op diagnosis: PRIMARY HYPER PARATHYROIDISM   Location: [facility] ROOM 03 / [facility]   Surgeons: Kinsinger, Arta Bruce, MD       DISCUSSION:66 y.o. former smoker with h/o HTN, DM II, CAD (stent to RCA 2002), CHF, primary hyperparathyroidism scheduled for above procedure 01/01/2023 with Dr. Gurney Maxin.   Pt last seen by cardiology 12/12/2022. Per OV note pt's functional capacity >4 METS, low risk for procedure.   Anticipate pt can proceed with planned procedure barring acute status change.   VS: BP 132/68   Pulse 67   Temp 36.9 C (Oral)   Resp 16   Ht '5\' 1"'$  (1.549 m)   Wt 102.1 kg   SpO2 98%   BMI 42.51 kg/m   PROVIDERS: Steele Sizer, MD is PCP   Isaias Cowman, MD is Cardiologist  LABS: Labs reviewed: Acceptable for surgery. (all labs ordered are listed, but only abnormal results are displayed)  Labs Reviewed  BASIC METABOLIC PANEL - Abnormal; Notable for the following components:      Result Value   Glucose, Bld 120 (*)    Creatinine, Ser 1.02 (*)    All other components within normal limits  GLUCOSE, CAPILLARY - Abnormal; Notable for the following components:   Glucose-Capillary 118 (*)    All other components within normal limits  CBC  HEMOGLOBIN A1C     IMAGES:   EKG:   CV: Echo 04/18/2022 INTERPRETATION  MILD LV SYSTOLIC DYSFUNCTION (See above)   WITH MILD LVH  NORMAL RIGHT VENTRICULAR SYSTOLIC FUNCTION  TRIVIAL REGURGITATION NOTED (See above)  NO VALVULAR STENOSIS  TRIVIAL TR, MR, PR  EF 40%  Past Medical History:  Diagnosis Date   Arthritis    Back pain    Breathing-related sleep disorder    CAD (coronary artery disease)    Diabetes mellitus without complication ([place])    History of MI (myocardial infarction)    Hyperlipidemia    Hypertension    Myocardial infarction ([place])    Obese    Postablative ovarian  failure    Vitamin D deficiency     Past Surgical History:  Procedure Laterality Date   ABDOMINAL HYSTERECTOMY     COLONOSCOPY     COLONOSCOPY WITH PROPOFOL N/A 05/09/2020   Procedure: COLONOSCOPY WITH PROPOFOL;  Surgeon: Jonathon Bellows, MD;  Location: [facility] ENDOSCOPY;  Service: Gastroenterology;  Laterality: N/A;   CORONARY ANGIOPLASTY  2002   stents   ESOPHAGOGASTRODUODENOSCOPY (EGD) WITH PROPOFOL N/A 11/05/2016   Procedure: ESOPHAGOGASTRODUODENOSCOPY (EGD) WITH PROPOFOL;  Surgeon: Jonathon Bellows, MD;  Location: [facility] ENDOSCOPY;  Service: Endoscopy;  Laterality: N/A;    MEDICATIONS:  acetaminophen (TYLENOL) 500 MG tablet   aspirin EC 81 MG tablet   atorvastatin (LIPITOR) 40 MG tablet   carvedilol (COREG) 3.125 MG tablet   Cholecalciferol (VITAMIN D3) 2000 UNITS capsule   empagliflozin (JARDIANCE) 25 MG TABS tablet   meloxicam (MOBIC) 15 MG tablet   Multiple Vitamins-Minerals (WOMENS MULTIVITAMIN PO)   sacubitril-valsartan (ENTRESTO) 24-26 MG   spironolactone (ALDACTONE) 25 MG tablet   No current facility-administered medications for this encounter.      Konrad Felix Ward, PA-C [facility] Pre-Surgical Testing [phone]

## 2023-01-01 ENCOUNTER — Observation Stay (HOSPITAL_COMMUNITY)
Admission: RE | Admit: 2023-01-01 | Discharge: 2023-01-02 | Disposition: A | Discharge status: Home / Self Care | Payer: Medicare Other | Attending: General Surgery | Admitting: General Surgery

## 2023-01-01 ENCOUNTER — Ambulatory Visit (HOSPITAL_COMMUNITY): Payer: Medicare Other | Admitting: Certified Registered Nurse Anesthetist

## 2023-01-01 ENCOUNTER — Other Ambulatory Visit: Payer: Self-pay

## 2023-01-01 ENCOUNTER — Encounter (HOSPITAL_COMMUNITY)
Admission: RE | Disposition: A | Discharge status: Home / Self Care | Payer: Self-pay | Source: Home / Self Care | Attending: General Surgery

## 2023-01-01 ENCOUNTER — Encounter (HOSPITAL_COMMUNITY): Payer: Self-pay | Admitting: General Surgery

## 2023-01-01 ENCOUNTER — Ambulatory Visit (HOSPITAL_BASED_OUTPATIENT_CLINIC_OR_DEPARTMENT_OTHER): Payer: Medicare Other | Admitting: Certified Registered Nurse Anesthetist

## 2023-01-01 DIAGNOSIS — I252 Old myocardial infarction: Secondary | ICD-10-CM

## 2023-01-01 DIAGNOSIS — D351 Benign neoplasm of parathyroid gland: Secondary | ICD-10-CM | POA: Diagnosis not present

## 2023-01-01 DIAGNOSIS — I2510 Atherosclerotic heart disease of native coronary artery without angina pectoris: Secondary | ICD-10-CM | POA: Insufficient documentation

## 2023-01-01 DIAGNOSIS — Z7982 Long term (current) use of aspirin: Secondary | ICD-10-CM | POA: Diagnosis not present

## 2023-01-01 DIAGNOSIS — Z79899 Other long term (current) drug therapy: Secondary | ICD-10-CM | POA: Insufficient documentation

## 2023-01-01 DIAGNOSIS — E210 Primary hyperparathyroidism: Secondary | ICD-10-CM

## 2023-01-01 DIAGNOSIS — I10 Essential (primary) hypertension: Secondary | ICD-10-CM | POA: Insufficient documentation

## 2023-01-01 DIAGNOSIS — Z87891 Personal history of nicotine dependence: Secondary | ICD-10-CM | POA: Insufficient documentation

## 2023-01-01 DIAGNOSIS — Z955 Presence of coronary angioplasty implant and graft: Secondary | ICD-10-CM | POA: Diagnosis not present

## 2023-01-01 DIAGNOSIS — Z01818 Encounter for other preprocedural examination: Secondary | ICD-10-CM

## 2023-01-01 HISTORY — PX: PARATHYROIDECTOMY: SHX19

## 2023-01-01 LAB — CBC
HCT: 39.6 % (ref 36.0–46.0)
Hemoglobin: 12.9 g/dL (ref 12.0–15.0)
MCH: 28.4 pg (ref 26.0–34.0)
MCHC: 32.6 g/dL (ref 30.0–36.0)
MCV: 87.2 fL (ref 80.0–100.0)
Platelets: 244 10*3/uL (ref 150–400)
RBC: 4.54 MIL/uL (ref 3.87–5.11)
RDW: 14.3 % (ref 11.5–15.5)
WBC: 10.6 10*3/uL — ABNORMAL HIGH (ref 4.0–10.5)
nRBC: 0 % (ref 0.0–0.2)

## 2023-01-01 LAB — GLUCOSE, CAPILLARY
Glucose-Capillary: 126 mg/dL — ABNORMAL HIGH (ref 70–99)
Glucose-Capillary: 114 mg/dL — ABNORMAL HIGH (ref 70–99)
Glucose-Capillary: 125 mg/dL — ABNORMAL HIGH (ref 70–99)

## 2023-01-01 LAB — HEMOGLOBIN A1C
Hgb A1c MFr Bld: 6.2 % — ABNORMAL HIGH (ref 4.8–5.6)
Mean Plasma Glucose: 131 mg/dL

## 2023-01-01 LAB — CREATININE, SERUM
Creatinine, Ser: 0.98 mg/dL (ref 0.44–1.00)
GFR, Estimated: >60 mL/min (ref >60)

## 2023-01-01 SURGERY — PARATHYROIDECTOMY
Anesthesia: General

## 2023-01-01 MED ORDER — CHLORHEXIDINE GLUCONATE CLOTH 2 % EX PADS: 6.0000 | MEDICATED_PAD | Freq: Once | CUTANEOUS | Status: DC

## 2023-01-01 MED ORDER — MIDAZOLAM HCL 5 MG/5ML IJ SOLN
INTRAMUSCULAR | Status: DC | PRN
  Administered 2023-01-01: 2 mg via INTRAVENOUS

## 2023-01-01 MED ORDER — PROMETHAZINE HCL 25 MG/ML IJ SOLN: 6.2500 mg | INTRAMUSCULAR | Status: DC | PRN

## 2023-01-01 MED ORDER — LIDOCAINE 2% (20 MG/ML) 5 ML SYRINGE
INTRAMUSCULAR | Status: DC | PRN
  Administered 2023-01-01: 80 mg via INTRAVENOUS

## 2023-01-01 MED ORDER — TRAMADOL HCL 50 MG PO TABS: 50.0000 mg | ORAL_TABLET | Freq: Four times a day (QID) | ORAL | Status: DC | PRN

## 2023-01-01 MED ORDER — ACETAMINOPHEN 500 MG PO TABS
1000.0000 mg | ORAL_TABLET | ORAL | Status: AC
  Administered 2023-01-01: 1000 mg via ORAL
  Filled 2023-01-01: qty 2

## 2023-01-01 MED ORDER — ENOXAPARIN SODIUM 40 MG/0.4ML IJ SOSY
40.0000 mg | PREFILLED_SYRINGE | INTRAMUSCULAR | Status: DC
  Administered 2023-01-02: 40 mg via SUBCUTANEOUS
  Filled 2023-01-01 (×2): qty 0.4

## 2023-01-01 MED ORDER — DIPHENHYDRAMINE HCL 50 MG/ML IJ SOLN: 12.5000 mg | Freq: Four times a day (QID) | INTRAMUSCULAR | Status: DC | PRN

## 2023-01-01 MED ORDER — OXYCODONE HCL 5 MG/5ML PO SOLN: 5.0000 mg | Freq: Once | ORAL | Status: DC | PRN

## 2023-01-01 MED ORDER — ROCURONIUM BROMIDE 10 MG/ML (PF) SYRINGE
PREFILLED_SYRINGE | INTRAVENOUS | Status: AC
  Filled 2023-01-01: qty 10

## 2023-01-01 MED ORDER — ALBUTEROL SULFATE HFA 108 (90 BASE) MCG/ACT IN AERS
INHALATION_SPRAY | RESPIRATORY_TRACT | Status: DC | PRN
  Administered 2023-01-01: 2 via RESPIRATORY_TRACT

## 2023-01-01 MED ORDER — ONDANSETRON HCL 4 MG/2ML IJ SOLN
INTRAMUSCULAR | Status: DC | PRN
  Administered 2023-01-01: 4 mg via INTRAVENOUS

## 2023-01-01 MED ORDER — PHENYLEPHRINE HCL-NACL 20-0.9 MG/250ML-% IV SOLN
INTRAVENOUS | Status: DC | PRN
  Administered 2023-01-01: 30 ug/min via INTRAVENOUS

## 2023-01-01 MED ORDER — CEFAZOLIN SODIUM-DEXTROSE 2-4 GM/100ML-% IV SOLN
2.0000 g | INTRAVENOUS | Status: AC
  Administered 2023-01-01: 2 g via INTRAVENOUS
  Filled 2023-01-01: qty 100

## 2023-01-01 MED ORDER — OXYCODONE HCL 5 MG PO TABS: 5.0000 mg | ORAL_TABLET | Freq: Once | ORAL | Status: DC | PRN

## 2023-01-01 MED ORDER — MIDAZOLAM HCL 2 MG/2ML IJ SOLN
INTRAMUSCULAR | Status: AC
  Filled 2023-01-01: qty 2

## 2023-01-01 MED ORDER — ACETAMINOPHEN 10 MG/ML IV SOLN
INTRAVENOUS | Status: AC
  Filled 2023-01-01: qty 100

## 2023-01-01 MED ORDER — PHENYLEPHRINE HCL (PRESSORS) 10 MG/ML IV SOLN
INTRAVENOUS | Status: AC
  Filled 2023-01-01: qty 1

## 2023-01-01 MED ORDER — DIPHENHYDRAMINE HCL 12.5 MG/5ML PO ELIX: 12.5000 mg | ORAL_SOLUTION | Freq: Four times a day (QID) | ORAL | Status: DC | PRN

## 2023-01-01 MED ORDER — PHENYLEPHRINE 80 MCG/ML (10ML) SYRINGE FOR IV PUSH (FOR BLOOD PRESSURE SUPPORT)
PREFILLED_SYRINGE | INTRAVENOUS | Status: AC
  Filled 2023-01-01: qty 10

## 2023-01-01 MED ORDER — PROPOFOL 10 MG/ML IV BOLUS
INTRAVENOUS | Status: DC | PRN
  Administered 2023-01-01: 120 mg via INTRAVENOUS

## 2023-01-01 MED ORDER — ACETAMINOPHEN 160 MG/5ML PO SOLN: 325.0000 mg | ORAL | Status: DC | PRN

## 2023-01-01 MED ORDER — ACETAMINOPHEN 500 MG PO TABS
1000.0000 mg | ORAL_TABLET | Freq: Four times a day (QID) | ORAL | Status: DC
  Administered 2023-01-01 – 2023-01-02 (×2): 1000 mg via ORAL
  Filled 2023-01-01 (×3): qty 2

## 2023-01-01 MED ORDER — ORAL CARE MOUTH RINSE: 15.0000 mL | Freq: Once | OROMUCOSAL | Status: AC

## 2023-01-01 MED ORDER — HYDROMORPHONE HCL 1 MG/ML IJ SOLN
INTRAMUSCULAR | Status: AC
  Filled 2023-01-01: qty 1

## 2023-01-01 MED ORDER — OXYCODONE HCL 5 MG PO TABS: 5.0000 mg | ORAL_TABLET | ORAL | Status: DC | PRN

## 2023-01-01 MED ORDER — ROCURONIUM BROMIDE 10 MG/ML (PF) SYRINGE
PREFILLED_SYRINGE | INTRAVENOUS | Status: DC | PRN
  Administered 2023-01-01: 50 mg via INTRAVENOUS
  Administered 2023-01-01 (×2): 10 mg via INTRAVENOUS

## 2023-01-01 MED ORDER — ONDANSETRON HCL 4 MG/2ML IJ SOLN: 4.0000 mg | Freq: Four times a day (QID) | INTRAMUSCULAR | Status: DC | PRN

## 2023-01-01 MED ORDER — PROPOFOL 10 MG/ML IV BOLUS
INTRAVENOUS | Status: AC
  Filled 2023-01-01: qty 20

## 2023-01-01 MED ORDER — LIDOCAINE HCL (PF) 2 % IJ SOLN
INTRAMUSCULAR | Status: AC
  Filled 2023-01-01: qty 5

## 2023-01-01 MED ORDER — ALBUTEROL SULFATE HFA 108 (90 BASE) MCG/ACT IN AERS
INHALATION_SPRAY | RESPIRATORY_TRACT | Status: AC
  Filled 2023-01-01: qty 6.7

## 2023-01-01 MED ORDER — DEXAMETHASONE SODIUM PHOSPHATE 10 MG/ML IJ SOLN
INTRAMUSCULAR | Status: DC | PRN
  Administered 2023-01-01: 4 mg via INTRAVENOUS

## 2023-01-01 MED ORDER — LACTATED RINGERS IV SOLN: INTRAVENOUS | Status: DC

## 2023-01-01 MED ORDER — FENTANYL CITRATE (PF) 100 MCG/2ML IJ SOLN
INTRAMUSCULAR | Status: DC | PRN
  Administered 2023-01-01 (×5): 50 ug via INTRAVENOUS

## 2023-01-01 MED ORDER — AMISULPRIDE (ANTIEMETIC) 5 MG/2ML IV SOLN: 10.0000 mg | Freq: Once | INTRAVENOUS | Status: DC | PRN

## 2023-01-01 MED ORDER — DEXAMETHASONE SODIUM PHOSPHATE 10 MG/ML IJ SOLN
INTRAMUSCULAR | Status: AC
  Filled 2023-01-01: qty 1

## 2023-01-01 MED ORDER — SUGAMMADEX SODIUM 200 MG/2ML IV SOLN
INTRAVENOUS | Status: DC | PRN
  Administered 2023-01-01: 200 mg via INTRAVENOUS

## 2023-01-01 MED ORDER — CHLORHEXIDINE GLUCONATE 0.12 % MT SOLN
15.0000 mL | Freq: Once | OROMUCOSAL | Status: AC
  Administered 2023-01-01: 15 mL via OROMUCOSAL

## 2023-01-01 MED ORDER — SPIRONOLACTONE 12.5 MG HALF TABLET
12.5000 mg | ORAL_TABLET | Freq: Every day | ORAL | Status: DC
  Administered 2023-01-01 – 2023-01-02 (×2): 12.5 mg via ORAL
  Filled 2023-01-01 (×2): qty 1

## 2023-01-01 MED ORDER — 0.9 % SODIUM CHLORIDE (POUR BTL) OPTIME
TOPICAL | Status: DC | PRN
  Administered 2023-01-01: 1000 mL

## 2023-01-01 MED ORDER — BUPIVACAINE-EPINEPHRINE (PF) 0.25% -1:200000 IJ SOLN
INTRAMUSCULAR | Status: AC
  Filled 2023-01-01: qty 30

## 2023-01-01 MED ORDER — CARVEDILOL 3.125 MG PO TABS
3.1250 mg | ORAL_TABLET | Freq: Two times a day (BID) | ORAL | Status: DC
  Administered 2023-01-01 – 2023-01-02 (×2): 3.125 mg via ORAL
  Filled 2023-01-01 (×2): qty 1

## 2023-01-01 MED ORDER — SACUBITRIL-VALSARTAN 24-26 MG PO TABS
1.0000 | ORAL_TABLET | Freq: Two times a day (BID) | ORAL | Status: DC
  Administered 2023-01-01 – 2023-01-02 (×2): 1 via ORAL
  Filled 2023-01-01 (×2): qty 1

## 2023-01-01 MED ORDER — HYDRALAZINE HCL 20 MG/ML IJ SOLN: 10.0000 mg | INTRAMUSCULAR | Status: DC | PRN

## 2023-01-01 MED ORDER — HEMOSTATIC AGENTS (NO CHARGE) OPTIME
TOPICAL | Status: DC | PRN
  Administered 2023-01-01: 1 via TOPICAL

## 2023-01-01 MED ORDER — ACETAMINOPHEN 10 MG/ML IV SOLN
1000.0000 mg | Freq: Once | INTRAVENOUS | Status: DC | PRN
  Administered 2023-01-01: 1000 mg via INTRAVENOUS

## 2023-01-01 MED ORDER — HYDROMORPHONE HCL 1 MG/ML IJ SOLN
0.5000 mg | INTRAMUSCULAR | Status: DC | PRN
  Administered 2023-01-01 (×2): 0.5 mg via INTRAVENOUS
  Filled 2023-01-01: qty 0.5

## 2023-01-01 MED ORDER — FENTANYL CITRATE PF 50 MCG/ML IJ SOSY: 25.0000 ug | PREFILLED_SYRINGE | INTRAMUSCULAR | Status: DC | PRN

## 2023-01-01 MED ORDER — ONDANSETRON HCL 4 MG/2ML IJ SOLN
INTRAMUSCULAR | Status: AC
  Filled 2023-01-01: qty 2

## 2023-01-01 MED ORDER — FENTANYL CITRATE (PF) 250 MCG/5ML IJ SOLN
INTRAMUSCULAR | Status: AC
  Filled 2023-01-01: qty 5

## 2023-01-01 MED ORDER — ONDANSETRON 4 MG PO TBDP: 4.0000 mg | ORAL_TABLET | Freq: Four times a day (QID) | ORAL | Status: DC | PRN

## 2023-01-01 MED ORDER — ACETAMINOPHEN 325 MG PO TABS: 325.0000 mg | ORAL_TABLET | ORAL | Status: DC | PRN

## 2023-01-01 MED ORDER — BUPIVACAINE-EPINEPHRINE 0.25% -1:200000 IJ SOLN
INTRAMUSCULAR | Status: DC | PRN
  Administered 2023-01-01: 15 mL

## 2023-01-01 SURGICAL SUPPLY — 43 items
ADH SKN CLS APL DERMABOND .7 (GAUZE/BANDAGES/DRESSINGS) ×1
APL PRP STRL LF DISP 70% ISPRP (MISCELLANEOUS) ×1
ATTRACTOMAT 16X20 MAGNETIC DRP (DRAPES) ×1 IMPLANT
BLADE SURG 15 STRL LF DISP TIS (BLADE) ×1 IMPLANT
BLADE SURG 15 STRL SS (BLADE) ×1
CHLORAPREP W/TINT 26 (MISCELLANEOUS) ×1 IMPLANT
CLIP TI MEDIUM 6 (CLIP) ×1 IMPLANT
CLIP TI WIDE RED SMALL 6 (CLIP) ×1 IMPLANT
COVER SURGICAL LIGHT HANDLE (MISCELLANEOUS) ×1 IMPLANT
DERMABOND ADVANCED .7 DNX12 (GAUZE/BANDAGES/DRESSINGS) ×1 IMPLANT
DISSECTOR SURG LIGASURE 21 (MISCELLANEOUS) ×1 IMPLANT
DRAPE LAPAROTOMY T 98X78 PEDS (DRAPES) ×1 IMPLANT
DRAPE UTILITY XL STRL (DRAPES) ×1 IMPLANT
ELECT COATED BLADE 2.86 ST (ELECTRODE) IMPLANT
ELECT PENCIL ROCKER SW 15FT (MISCELLANEOUS) ×1 IMPLANT
ELECT REM PT RETURN 15FT ADLT (MISCELLANEOUS) ×1 IMPLANT
GAUZE 4X4 16PLY ~~LOC~~+RFID DBL (SPONGE) ×1 IMPLANT
GLOVE BIOGEL PI IND STRL 7.0 (GLOVE) ×1 IMPLANT
GLOVE SURG SS PI 7.0 STRL IVOR (GLOVE) ×1 IMPLANT
GOWN STRL REUS W/ TWL LRG LVL3 (GOWN DISPOSABLE) ×1 IMPLANT
GOWN STRL REUS W/ TWL XL LVL3 (GOWN DISPOSABLE) IMPLANT
GOWN STRL REUS W/TWL LRG LVL3 (GOWN DISPOSABLE) ×2
GOWN STRL REUS W/TWL XL LVL3 (GOWN DISPOSABLE)
HEMOSTAT SNOW SURGICEL 2X4 (HEMOSTASIS) ×1 IMPLANT
HEMOSTAT SURGICEL 2X4 FIBR (HEMOSTASIS) IMPLANT
ILLUMINATOR WAVEGUIDE N/F (MISCELLANEOUS) ×1 IMPLANT
KIT BASIN OR (CUSTOM PROCEDURE TRAY) ×1 IMPLANT
KIT TURNOVER KIT A (KITS) IMPLANT
NDL HYPO 25X1 1.5 SAFETY (NEEDLE) ×1 IMPLANT
NEEDLE HYPO 25X1 1.5 SAFETY (NEEDLE) ×1 IMPLANT
PACK BASIC VI WITH GOWN DISP (CUSTOM PROCEDURE TRAY) ×1 IMPLANT
STAPLER VISISTAT 35W (STAPLE) IMPLANT
SUT MNCRL AB 4-0 PS2 18 (SUTURE) ×1 IMPLANT
SUT SILK 2 0 (SUTURE)
SUT SILK 2-0 18XBRD TIE 12 (SUTURE) IMPLANT
SUT SILK 3 0 (SUTURE)
SUT SILK 3-0 18XBRD TIE 12 (SUTURE) IMPLANT
SUT VIC AB 3-0 SH 18 (SUTURE) ×1 IMPLANT
SYR BULB IRRIG 60ML STRL (SYRINGE) ×1 IMPLANT
SYR CONTROL 10ML LL (SYRINGE) ×1 IMPLANT
TOWEL OR 17X26 10 PK STRL BLUE (TOWEL DISPOSABLE) ×1 IMPLANT
TOWEL OR NON WOVEN STRL DISP B (DISPOSABLE) ×1 IMPLANT
TUBING CONNECTING 10 (TUBING) ×1 IMPLANT

## 2023-01-01 NOTE — Op Note (Signed)
Preoperative diagnosis: primary hyperparathyroidism  Postoperative diagnosis: same   Procedure: left superior and left inferior parathyroidectomy  Surgeon: Gurney Maxin, M.D.  Asst: Sherlean Foot  Anesthesia: GETA  Indications for procedure: Kathy Price is a 67 y.o. year old female with symptoms of hypercalcemia. NM localized to the left superior. After discussing risks and benefits she was consented for parathyroidectomy and brought to surgery.  Description of procedure: The patient was brought to the operating room and placed in a supine position on the operating room table. Following administration of general anesthesia, the patient was positioned and then prepped and draped in the usual aseptic fashion.   A small Kocher incision was made. Dissection was carried through subcutaneous tissues and platysma. Hemostasis was achieved with the electrocautery. Skin flaps were elevated cephalad and caudad from the thyroid notch to the sternal notch. A [place] retractor was placed for exposure. Strap muscles were incised in the midline and dissection was begun on the left side.  After some dissection, it became clear the incision in the stap muscles was on the patient's right and a moderate amount of dissection along the right superior thyroid lobe had been performed including ligation and division of the right superior thyroid vessels.  Once this was realized, the left thyroid lobe was exposed and retracted medially. The right middle thyroid artery was identified, there was a glandular structure just below it that was dissected free and removed. It was sent for frozen pathology and confirmed to be hypercellular parathyroid tissue. During the pathology review, additional dissection was performed superior to the right middle parathyroid vessels and a second enlarged gland was identified and removed. This was also sent for frozen pathology and was also confirmed to be hypercellular parathyroid  tissue.  The area was inspected and hemostasis was applied with cautery.   After pathology confirmation, Emogene Morgan surgicel was placed into the dissection plane. The strap muscles were re-apposed with interrupted 3-0 vicryl. The platysma was re-approximated with interrupted 3-0 vicryl. The skin was closed with 4-0 monocryl in running fashion. Dermabond was placed for dressing. The patient woke up and was taken to PACU in stable condition.  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 General, Bariatric, & Minimally Invasive Surgery [facility]

## 2023-01-01 NOTE — Transfer of Care (Signed)
Immediate Anesthesia Transfer of Care Note  Patient: Kathy Price  Procedure(s) Performed: LEFT SUPERIOR AND INFERIOR PARATHYROIDECTOMY  Patient Location: PACU  Anesthesia Type:General  Level of Consciousness: drowsy and patient cooperative  Airway & Oxygen Therapy: Patient Spontanous Breathing and Patient connected to face mask oxygen  Post-op Assessment: Report given to RN and Post -op Vital signs reviewed and stable  Post vital signs: Reviewed and stable  Last Vitals:  Vitals Value Taken Time  BP 145/71 01/01/23 1243  Temp    Pulse 77 01/01/23 1244  Resp 14 01/01/23 1244  SpO2 99 % 01/01/23 1244  Vitals shown include unvalidated device data.  Last Pain:  Vitals:   01/01/23 0821  PainSc: 0-No pain      Patients Stated Pain Goal: 4 ([id number])  Complications: No notable events documented.

## 2023-01-01 NOTE — H&P (Signed)
Chief Complaint: Hyperparathyroidism   History of Present Illness: Kathy Price is a 67 y.o. female who is seen today as an office consultation for evaluation of Hyperparathyroidism .   She denies history of kidney stones, osteoporosis, depression, or arrhythmia. She had labs showing elevated calcium and follow up PTH lab were also elevated.  Review of Systems: A complete review of systems was obtained from the patient. I have reviewed this information and discussed as appropriate with the patient. See HPI as well for other ROS.  Review of Systems  Constitutional: Negative.  HENT: Negative.  Eyes: Negative.  Respiratory: Negative.  Cardiovascular: Negative.  Gastrointestinal: Negative.  Genitourinary: Negative.  Musculoskeletal: Negative.  Skin: Negative.  Neurological: Negative.  Endo/Heme/Allergies: Negative.  Psychiatric/Behavioral: Negative.    Medical History: Past Medical History:  Diagnosis Date  CAD (coronary artery disease)  Hyperlipidemia  Hypertension  Lupus (CMS-HCC)  Myocardial infarction (CMS-HCC)  08/2001   Patient Active Problem List  Diagnosis  Essential hypertension  Hyperlipidemia, mixed  CAD (coronary artery disease)  Cardiomyopathy, ischemic   Past Surgical History:  Procedure Laterality Date  Back surgery 2009  Stent placement, 08/2001    Allergies  Allergen Reactions  Metoprolol Other (See Comments)  severe bradycardia  Sulfa (Sulfonamide Antibiotics) Rash  Pantoprazole Rash   Current Outpatient Medications on File Prior to Visit  Medication Sig Dispense Refill  aspirin 81 MG EC tablet Take 81 mg by mouth once daily. (Patient not taking: Reported on 11/15/2022)  atorvastatin (LIPITOR) 40 MG tablet Take 40 mg by mouth once daily  carvediloL (COREG) 3.125 MG tablet TAKE 1 TABLET BY MOUTH TWICE A DAY WITH MEALS 180 tablet 3  famotidine (PEPCID) 20 MG tablet TAKE 1 TABLET (20 MG TOTAL) BY MOUTH 2 (TWO) TIMES DAILY. 2  JARDIANCE 25 mg  Tab tablet Take 25 mg by mouth daily with breakfast (Patient not taking: Reported on 11/15/2022) 0  multivitamin tablet Take 1 tablet by mouth once daily.  mv-min-folic-calcium carb-K1 (WOMEN'S 50 PLUS MULTIVITAMIN) 400 mcg-500 mg calcium-20 mcg Tab 1pill Orally once a day  omeprazole (PRILOSEC) 20 MG DR capsule TAKE 1 CAPSULE (20 MG TOTAL) BY MOUTH 2 (TWO) TIMES DAILY BEFORE A MEAL. (Patient not taking: Reported on 11/15/2022) 2  sacubitriL-valsartan (ENTRESTO) 24-26 mg tablet Take 1 tablet by mouth 2 (two) times daily 180 tablet 3  spironolactone (ALDACTONE) 25 MG tablet TAKE 1/2 TABLET BY MOUTH EVERY DAY 45 tablet 3   No current facility-administered medications on file prior to visit.   Family History  Problem Relation Age of Onset  Diabetes type II Mother  Prostate cancer Father  Breast cancer Sister  Coronary Artery Disease (Blocked arteries around heart) Brother  Myocardial Infarction (Heart attack) Other    Social History   Tobacco Use  Smoking Status Former  Types: Cigarettes  Quit date: 05/13/2002  Years since quitting: 20.5  Smokeless Tobacco Never    Social History   Socioeconomic History  Marital status: Married  Tobacco Use  Smoking status: Former  Types: Cigarettes  Quit date: 05/13/2002  Years since quitting: 20.5  Smokeless tobacco: Never  Vaping Use  Vaping Use: Never used  Substance and Sexual Activity  Alcohol use: No  Alcohol/week: 0.0 standard drinks of alcohol  Drug use: Never   Objective:   Vitals:  11/15/22 1456  BP: 132/82  Pulse: 89  Temp: 36.4 C (97.5 F)  SpO2: 98%  Weight: (!) 105.9 kg (233 lb 6.4 oz)  Height: 154.9 cm ('5\' 1"'$ )  PainSc:  0-No pain   Body mass index is 44.1 kg/m.  Physical Exam Constitutional:  Appearance: Normal appearance.  HENT:  Head: Normocephalic and atraumatic.  Pulmonary:  Effort: Pulmonary effort is normal.  Musculoskeletal:  General: Normal range of motion.  Cervical back: Normal range of motion.   Neurological:  General: No focal deficit present.  Mental Status: She is alert and oriented to person, place, and time. Mental status is at baseline.  Psychiatric:  Mood and Affect: Mood normal.  Behavior: Behavior normal.  Thought Content: Thought content normal.    Labs, Imaging and Diagnostic Testing: I reviewed notes by Delrae Rend I reviewed 46 NM study localizing to left superior, labs with PTH of 98, Ca of 11.0  Assessment and Plan:   Diagnoses and all orders for this visit:  Primary hyperparathyroidism (CMS-HCC)  Coronary artery disease involving native coronary artery of native heart without angina pectoris  The anatomy and physiology of the thyroid gland and organs of the neck were discussed. Pathophysiology of parathyroid problems were discussed. Options were discussed, and I made a recommendation to remove one or more parathyroid glands to treat the pathology.   Risks of bleeding, infection, injury to other organs including nerves, reoperation, death, and other risks were discussed. I noted a good likelihood this will help address the problem. While there are risks, I feel the risks of nonoperative management are greater; therefore, I feel surgery offers the best option. Educational material was given to help further explain the topics & concerns from our discussion. We will work to minimize complications.

## 2023-01-01 NOTE — Anesthesia Postprocedure Evaluation (Signed)
Anesthesia Post Note  Patient: Kathy Price  Procedure(s) Performed: LEFT SUPERIOR AND INFERIOR PARATHYROIDECTOMY     Patient location during evaluation: PACU Anesthesia Type: General Level of consciousness: awake and alert Pain management: pain level controlled Vital Signs Assessment: post-procedure vital signs reviewed and stable Respiratory status: spontaneous breathing, nonlabored ventilation, respiratory function stable and patient connected to nasal cannula oxygen Cardiovascular status: blood pressure returned to baseline and stable Postop Assessment: no apparent nausea or vomiting Anesthetic complications: no  No notable events documented.  Last Vitals:  Vitals:   01/01/23 1330 01/01/23 1345  BP: (!) 163/84 (!) 164/82  Pulse: 73 75  Resp: 14 15  Temp:  36.4 C  SpO2: 96% 96%    Last Pain:  Vitals:   01/01/23 1345  PainSc: 0-No pain                 Effie Berkshire

## 2023-01-01 NOTE — Anesthesia Procedure Notes (Signed)
Procedure Name: Intubation Date/Time: 01/01/2023 10:34 AM  Performed by: Montel Clock, CRNAPre-anesthesia Checklist: Patient identified, Emergency Drugs available, Suction available, Patient being monitored and Timeout performed Patient Re-evaluated:Patient Re-evaluated prior to induction Oxygen Delivery Method: Circle system utilized Preoxygenation: Pre-oxygenation with 100% oxygen Induction Type: IV induction Ventilation: Mask ventilation without difficulty and Oral airway inserted - appropriate to patient size Laryngoscope Size: Mac and 3 Grade View: Grade I Tube type: Oral Tube size: 7.0 mm Number of attempts: 1 Airway Equipment and Method: Stylet Placement Confirmation: ETT inserted through vocal cords under direct vision, positive ETCO2 and breath sounds checked- equal and bilateral Secured at: 21 cm Tube secured with: Tape Dental Injury: Teeth and Oropharynx as per pre-operative assessment

## 2023-01-02 ENCOUNTER — Encounter (HOSPITAL_COMMUNITY): Payer: Self-pay | Admitting: General Surgery

## 2023-01-02 DIAGNOSIS — Z7982 Long term (current) use of aspirin: Secondary | ICD-10-CM | POA: Diagnosis not present

## 2023-01-02 DIAGNOSIS — I2510 Atherosclerotic heart disease of native coronary artery without angina pectoris: Secondary | ICD-10-CM | POA: Diagnosis not present

## 2023-01-02 DIAGNOSIS — Z87891 Personal history of nicotine dependence: Secondary | ICD-10-CM | POA: Diagnosis not present

## 2023-01-02 DIAGNOSIS — Z955 Presence of coronary angioplasty implant and graft: Secondary | ICD-10-CM | POA: Diagnosis not present

## 2023-01-02 DIAGNOSIS — Z79899 Other long term (current) drug therapy: Secondary | ICD-10-CM | POA: Diagnosis not present

## 2023-01-02 DIAGNOSIS — Z9889 Other specified postprocedural states: Secondary | ICD-10-CM | POA: Insufficient documentation

## 2023-01-02 DIAGNOSIS — I10 Essential (primary) hypertension: Secondary | ICD-10-CM | POA: Diagnosis not present

## 2023-01-02 DIAGNOSIS — E210 Primary hyperparathyroidism: Secondary | ICD-10-CM | POA: Diagnosis not present

## 2023-01-02 DIAGNOSIS — Z9089 Acquired absence of other organs: Secondary | ICD-10-CM | POA: Insufficient documentation

## 2023-01-02 LAB — BASIC METABOLIC PANEL
Anion gap: 9 (ref 5–15)
BUN: 12 mg/dL (ref 8–23)
CO2: 23 mmol/L (ref 22–32)
Calcium: 9.3 mg/dL (ref 8.9–10.3)
Chloride: 106 mmol/L (ref 98–111)
Creatinine, Ser: 0.92 mg/dL (ref 0.44–1.00)
GFR, Estimated: >60 mL/min (ref >60)
Glucose, Bld: 115 mg/dL — ABNORMAL HIGH (ref 70–99)
Potassium: 3.9 mmol/L (ref 3.5–5.1)
Sodium: 138 mmol/L (ref 135–145)

## 2023-01-02 LAB — SURGICAL PATHOLOGY

## 2023-01-02 NOTE — Discharge Summary (Addendum)
Physician Discharge Summary  Kathy Price [id number] DOB: 02-Dec-1955 DOA: 01/01/2023  PCP: Steele Sizer, MD  Admit date: 01/01/2023 Discharge date: 01/02/2023  Recommendations for Outpatient Follow-up:   (include homehealth, outpatient follow-up instructions, specific recommendations for PCP to follow-up on, etc.)   Discharge Diagnoses:  Principal Problem:   Primary hyperparathyroidism [facility])   Surgical Procedure: parathyroidectomy (left superior and left inferior)  Discharge Condition: Good Disposition: Home  Diet recommendation: reg diet   Hospital Course:  67 yo female with primary hyperparathyroidism presented for surgery and underwent excision of 2 adenomas. She did well and was discharged home POD 1.  Discharge Instructions  Discharge Instructions     Call MD for:  difficulty breathing, headache or visual disturbances   Complete by: As directed    Call MD for:  hives   Complete by: As directed    Call MD for:  persistant nausea and vomiting   Complete by: As directed    Call MD for:  redness, tenderness, or signs of infection (pain, swelling, redness, odor or green/yellow discharge around incision site)   Complete by: As directed    Call MD for:  severe uncontrolled pain   Complete by: As directed    Call MD for:  temperature >100.4   Complete by: As directed    Diet - low sodium heart healthy   Complete by: As directed    Discharge wound care:   Complete by: As directed    Ok to shower tomorrow. Glue will likely peel off in 1-3 weeks. No bandage required. Ice to wound at least 2 times daily for next 7 days   Driving Restrictions   Complete by: As directed    No driving while on narcotics   Increase activity slowly   Complete by: As directed    Lifting restrictions   Complete by: As directed    No lifting greater than 20 pounds for 3 weeks      Allergies as of 01/02/2023       Reactions   Metoprolol Other (See Comments)   severe bradycardia    Pantoprazole Rash   Sulfa Antibiotics Rash, Hives        Medication List     TAKE these medications    acetaminophen 500 MG tablet Commonly known as: TYLENOL Take 1,000 mg by mouth every 6 (six) hours as needed for moderate pain.   aspirin EC 81 MG tablet Take 1 tablet by mouth daily.   atorvastatin 40 MG tablet Commonly known as: LIPITOR Take 1 tablet (40 mg total) by mouth daily.   carvedilol 3.125 MG tablet Commonly known as: COREG Take 1 tablet by mouth 2 (two) times a day.   empagliflozin 25 MG Tabs tablet Commonly known as: Jardiance Take 1 tablet (25 mg total) by mouth daily.   meloxicam 15 MG tablet Commonly known as: MOBIC TAKE 1/2 TO 1 TABLET (7.5-15 MG TOTAL) BY MOUTH DAILY What changed: See the new instructions.   sacubitril-valsartan 24-26 MG Commonly known as: ENTRESTO Take 1 tablet by mouth 2 (two) times daily.   spironolactone 25 MG tablet Commonly known as: ALDACTONE Take 12.5 mg by mouth daily.   Vitamin D3 50 MCG (2000 UT) Caps Generic drug: Cholecalciferol Take 1 capsule by mouth daily.   WOMENS MULTIVITAMIN PO Take 1 tablet by mouth.               Discharge Care Instructions  (From admission, onward)  Start     Ordered   01/02/23 0000  Discharge wound care:       Comments: Ok to shower tomorrow. Glue will likely peel off in 1-3 weeks. No bandage required. Ice to wound at least 2 times daily for next 7 days   01/02/23 0841              The results of significant diagnostics from this hospitalization (including imaging, microbiology, ancillary and laboratory) are listed below for reference.    Significant Diagnostic Studies: No results found.  Labs: Basic Metabolic Panel: Recent Labs  Lab 12/31/22 0814 01/01/23 1546 01/02/23 0626  NA 141  --  138  K 4.4  --  3.9  CL 106  --  106  CO2 25  --  23  GLUCOSE 120*  --  115*  BUN 15  --  12  CREATININE 1.02* 0.98 0.92  CALCIUM 10.0  --  9.3   Liver  Function Tests: No results for input(s): "AST", "ALT", "ALKPHOS", "BILITOT", "PROT", "ALBUMIN" in the last 168 hours.  CBC: Recent Labs  Lab 12/31/22 0814 01/01/23 1546  WBC 7.0 10.6*  HGB 13.0 12.9  HCT 39.3 39.6  MCV 86.0 87.2  PLT 247 244    CBG: Recent Labs  Lab 12/31/22 0809 01/01/23 0807 01/01/23 1245 01/01/23 1703  GLUCAP 118* 126* 114* 125*    Principal Problem:   Primary hyperparathyroidism ([place])   Time coordinating discharge: 15 min

## 2023-01-02 NOTE — TOC CM/SW Note (Signed)
Transition of Care [facility]) Screening Note  Patient Details  Name: Kathy Price Date of Birth: 1956/10/06  Transition of Care [facility]) CM/SW Contact:    Sherie Don, LCSW Phone Number: 01/02/2023, 8:15 AM  Transition of Care Department [facility]) has reviewed patient and no TOC needs have been identified at this time. We will continue to monitor patient advancement through interdisciplinary progression rounds. If new patient transition needs arise, please place a TOC consult.

## 2023-01-03 ENCOUNTER — Telehealth: Payer: Self-pay

## 2023-01-03 NOTE — Transitions of Care (Post Inpatient/ED Visit) (Signed)
   01/03/2023  Name: ANNETE JEREMY MRN: [id number] DOB: July 24, 1956  Today's TOC FU Call Status: Today's TOC FU Call Status:: Successful TOC FU Call Competed TOC FU Call Complete Date: 01/03/23  Transition Care Management Follow-up Telephone Call Date of Discharge: 01/02/23 Discharge Facility: [facility]) Type of Discharge: Inpatient Admission Primary Inpatient Discharge Diagnosis:: hyperparathyroidism How have you been since you were released from the hospital?: Better Any questions or concerns?: No  Items Reviewed: Did you receive and understand the discharge instructions provided?: Yes Medications obtained and verified?: Yes (Medications Reviewed) Any new allergies since your discharge?: No Dietary orders reviewed?: Yes Do you have support at home?: Yes People in Home: grandchild(ren)  Home Care and Equipment/Supplies: [place] Ordered?: NA Any new equipment or medical supplies ordered?: NA  Functional Questionnaire: Do you need assistance with bathing/showering or dressing?: No Do you need assistance with meal preparation?: No Do you need assistance with eating?: No Do you have difficulty maintaining continence: No Do you need assistance with getting out of bed/getting out of a chair/moving?: No Do you have difficulty managing or taking your medications?: No  Folllow up appointments reviewed: PCP Follow-up appointment confirmed?: NA Specialist Hospital Follow-up appointment confirmed?: Yes Date of Specialist follow-up appointment?: 01/23/23 Follow-Up Specialty Provider:: Dr Kieth Brightly Do you need transportation to your follow-up appointment?: No Do you understand care options if your condition(s) worsen?: Yes-patient verbalized understanding    Cherry Log, [place] Nurse Health Advisor Direct Dial [phone]

## 2023-01-04 LAB — CALCIUM, IONIZED: Calcium, Ionized, Serum: 5.2 mg/dL (ref 4.5–5.6)

## 2023-01-26 ENCOUNTER — Other Ambulatory Visit: Payer: Self-pay | Admitting: Family Medicine

## 2023-02-11 ENCOUNTER — Other Ambulatory Visit: Payer: Self-pay | Admitting: Family Medicine

## 2023-02-11 DIAGNOSIS — M25561 Pain in right knee: Secondary | ICD-10-CM

## 2023-02-13 NOTE — Progress Notes (Unsigned)
Name: Kathy Price   MRN: [id number]    DOB: 23-Mar-1956   Date:02/14/2023       Progress Note  Subjective  Chief Complaint  Follow Up  HPI  DMII: A1C has been at goal, she had it done in March right before surgery and it was at goal - 6.2 % - . She denies polyphagia, polydipsia or polyuria. Urine micro was elevated was 104   she is on ARB ( on the form of Entresto) and also on Jardiance, we will continue to monitor. Needs to avoid NSAID's   CAD: s/p MI and stent placement 08/2001,  sees  Dr. 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 Stress test only showed old lesion, no acute ischemia She has been taking apirin 81 mg daily, Entresto, spironolactone , carvedilol and atorvastatin. She denies SOB ,diaphoresis, orthopnea or edema.  Last LDL was up from 76 to 133, she states she was not very compliant with medication last Fall but has been taking it daily now    INTERPRETATION 03/2022 MILD LV SYSTOLIC DYSFUNCTION (See above)   WITH MILD LVH  NORMAL RIGHT VENTRICULAR SYSTOLIC FUNCTION  TRIVIAL REGURGITATION NOTED (See above)  NO VALVULAR STENOSIS  TRIVIAL TR, MR, PR  EF 40%    GERD:she had EGD done by Dr. Tobi Bastos back 10/2016 and was treated for h. Pylori gastritis. She is no  longer having indigestion or epigastric pain, very seldom takes tums or pepcid    Morbid Obesity: she gained 20 lbs from  07/2018 until 2021, stopped working August 2019 and has not been as active, states also gained more since COVID. Her weight has been around 240 lbs. Weight is 234 lbs, she is trying to walk more   OA knee: she was seen here and followed up with Ortho had steroid injection , she was given meloxicam to take prn but we will stop it now since urine micro is trending up, take tylenol only or topical medications for knee pain   Hyperlipidemia: LDL was up take medications daily   Hyperparathyroidism: she had both left  parathyroid glands  removed  01/02/2023, only had headaches on the post op, she is due for labs and we will order and send it to Dr. Sheliah Hatch, but reminded patient to send me a message once she gets results to confirm that labs were forwarded to him    Patient Active Problem List   Diagnosis Date Noted   History of parathyroidectomy 01/02/2023   Primary osteoarthritis of both knees 08/15/2022   Helicobacter pylori gastritis 11/06/2016   Ischemic dilated cardiomyopathy 01/30/2016   CAD S/P percutaneous coronary angioplasty 09/07/2015   Dyslipidemia associated with type 2 diabetes mellitus 09/07/2015   Dyslipidemia 09/07/2015   H/O: hysterectomy 09/07/2015   H/O acute myocardial infarction 09/07/2015   Vitamin D deficiency 09/07/2015   Morbid obesity 09/07/2015   Bradycardia 05/13/2014   Arteriosclerosis of coronary artery 05/13/2014   Essential (primary) hypertension 05/13/2014   UARS (upper airway resistance syndrome) 01/23/2013    Past Surgical History:  Procedure Laterality Date   ABDOMINAL HYSTERECTOMY     COLONOSCOPY     COLONOSCOPY WITH PROPOFOL N/A 05/09/2020   Procedure: COLONOSCOPY WITH PROPOFOL;  Surgeon: Wyline Mood, MD;  Location[facility] ENDOSCOPY;  Service: Gastroenterology;  Laterality: N/A;   CORONARY ANGIOPLASTY  2002   stents   ESOPHAGOGASTRODUODENOSCOPY (EGD) WITH PROPOFOL N/A 11/05/2016   Procedure: ESOPHAGOGASTRODUODENOSCOPY (EGD) WITH PROPOFOL;  Surgeon: Sharlet Salina  Tobi Bastos, MD;  Location: [facility] ENDOSCOPY;  Service: Endoscopy;  Laterality: N/A;   PARATHYROIDECTOMY N/A 01/01/2023   Procedure: LEFT SUPERIOR AND INFERIOR PARATHYROIDECTOMY;  Surgeon: Kinsinger, De Blanch, MD;  Location: [facility] ORS;  Service: General;  Laterality: N/A;    Family History  Problem Relation Age of Onset   Diabetes Mother    Hypertension Mother    Cancer Father    CAD Brother    Breast cancer Sister 18    Social History   Tobacco Use   Smoking status: Former    Packs/day: 1.00    Years: 25.00    Additional pack years:  0.00    Total pack years: 25.00    Types: Cigarettes    Quit date: 09/06/2000    Years since quitting: 22.4    Passive exposure: Current (husband smokes inside)   Smokeless tobacco: Never   Tobacco comments:    Smoking cessation materials not required  Substance Use Topics   Alcohol use: No    Alcohol/week: 0.0 standard drinks of alcohol     Current Outpatient Medications:    acetaminophen (TYLENOL) 500 MG tablet, Take 1,000 mg by mouth every 6 (six) hours as needed for moderate pain., Disp: , Rfl:    aspirin EC 81 MG tablet, Take 1 tablet by mouth daily., Disp: , Rfl:    atorvastatin (LIPITOR) 40 MG tablet, Take 1 tablet (40 mg total) by mouth daily., Disp: 90 tablet, Rfl: 1   Cholecalciferol (VITAMIN D3) 2000 UNITS capsule, Take 1 capsule by mouth daily., Disp: , Rfl:    empagliflozin (JARDIANCE) 25 MG TABS tablet, Take 1 tablet (25 mg total) by mouth daily., Disp: 90 tablet, Rfl: 3   Multiple Vitamins-Minerals (WOMENS MULTIVITAMIN PO), Take 1 tablet by mouth. , Disp: , Rfl:    sacubitril-valsartan (ENTRESTO) 24-26 MG, Take 1 tablet by mouth 2 (two) times daily., Disp: , Rfl:    carvedilol (COREG) 3.125 MG tablet, Take 1 tablet (3.125 mg total) by mouth 2 (two) times daily with a meal., Disp: 180 tablet, Rfl: 0   spironolactone (ALDACTONE) 25 MG tablet, Take 12.5 mg by mouth daily., Disp: , Rfl:   Allergies  Allergen Reactions   Metoprolol Other (See Comments)    severe bradycardia   Pantoprazole Rash   Sulfa Antibiotics Rash and Hives    I personally reviewed active problem list, medication list, allergies, family history, social history, health maintenance with the patient/caregiver today.   ROS  Constitutional: Negative for fever or weight change.  Respiratory: Negative for cough and shortness of breath.   Cardiovascular: Negative for chest pain or palpitations.  Gastrointestinal: Negative for abdominal pain, no bowel changes.  Musculoskeletal: Negative for gait problem  or joint swelling.  Skin: Negative for rash.  Neurological: Negative for dizziness or headache.  No other specific complaints in a complete review of systems (except as listed in HPI above).   Objective  Vitals:   02/14/23 0803  BP: 126/72  Pulse: 90  Resp: 16  SpO2: 98%  Weight: 234 lb (106.1 kg)  Height:  (1.549 m)    Body mass index is 44.21 kg/m.  Physical Exam  Constitutional: Patient appears well-developed and well-nourished. Obese  No distress.  HEENT: head atraumatic, normocephalic, pupils equal and reactive to light, neck supple Cardiovascular: Normal rate, regular rhythm and normal heart sounds.  No murmur heard. Trace  BLE edema. Pulmonary/Chest: Effort normal and breath sounds normal. No respiratory distress. Abdominal: Soft.  There is no tenderness. Psychiatric:  Patient has a normal mood and affect. behavior is normal. Judgment and thought content normal.    Diabetic Foot Exam: Diabetic Foot Exam - Simple   Simple Foot Form Visual Inspection No deformities, no ulcerations, no other skin breakdown bilaterally: Yes Sensation Testing Intact to touch and monofilament testing bilaterally: Yes Pulse Check Posterior Tibialis and Dorsalis pulse intact bilaterally: Yes Comments      PHQ2/9:    02/14/2023    8:03 AM 10/25/2022   12:15 PM 08/15/2022    8:29 AM 08/02/2022    1:44 PM 04/12/2022    8:33 AM  Depression screen PHQ 2/9  Decreased Interest 0 0 0 0 0  Down, Depressed, Hopeless 0 0 0 0 0  PHQ - 2 Score 0 0 0 0 0  Altered sleeping 0 0 0 0   Tired, decreased energy 0 0 0 0   Change in appetite 0 0 0 0   Feeling bad or failure about yourself  0 0 0 0   Trouble concentrating 0 0 0 0   Moving slowly or fidgety/restless 0 0 0 0   Suicidal thoughts 0 0 0 0   PHQ-9 Score 0 0 0 0   Difficult doing work/chores    Not difficult at all     phq 9 is negative   Fall Risk:    02/14/2023    8:02 AM 10/25/2022   12:15 PM 08/15/2022    8:29 AM  08/02/2022    1:44 PM 04/12/2022    8:34 AM  Fall Risk   Falls in the past year? 0 0 0 0 0  Number falls in past yr: 0 0 0 0 0  Injury with Fall? 0 0 0 0 0  Risk for fall due to : No Fall Risks No Fall Risks No Fall Risks No Fall Risks No Fall Risks  Follow up Falls prevention discussed Falls prevention discussed Falls prevention discussed Falls prevention discussed;Education provided Falls prevention discussed      Functional Status Survey: Is the patient deaf or have difficulty hearing?: No Does the patient have difficulty seeing, even when wearing glasses/contacts?: No Does the patient have difficulty concentrating, remembering, or making decisions?: No Does the patient have difficulty walking or climbing stairs?: No Does the patient have difficulty dressing or bathing?: No Does the patient have difficulty doing errands alone such as visiting a doctor's office or shopping?: No    Assessment & Plan  1. Controlled type 2 diabetes mellitus with microalbuminuria, without long-term current use of insulin  - HM Diabetes Foot Exam  2. History of parathyroidectomy  - TSH + free T4 - PTH, intact and calcium  3. Morbid obesity  Discussed life style modification  4. Dyslipidemia associated with type 2 diabetes mellitus  Needs to take Atorvastatin daily   5. Vitamin D deficiency  Continue supplementation  6. Primary osteoarthritis of both knees  Stop NSAID's  7. Ischemic dilated cardiomyopathy  Stable  8. Essential (primary) hypertension  BP is at goal   9. Breast cancer screening by mammogram  - MM 3D SCREENING MAMMOGRAM BILATERAL BREAST; Future

## 2023-02-14 ENCOUNTER — Ambulatory Visit (INDEPENDENT_AMBULATORY_CARE_PROVIDER_SITE_OTHER): Payer: Medicare Other | Admitting: Family Medicine

## 2023-02-14 ENCOUNTER — Encounter: Payer: Self-pay | Admitting: Family Medicine

## 2023-02-14 VITALS — BP 126/72 | HR 90 | Resp 16 | Ht 61.0 in | Wt 234.0 lb

## 2023-02-14 DIAGNOSIS — Z9089 Acquired absence of other organs: Secondary | ICD-10-CM

## 2023-02-14 DIAGNOSIS — I420 Dilated cardiomyopathy: Secondary | ICD-10-CM

## 2023-02-14 DIAGNOSIS — I255 Ischemic cardiomyopathy: Secondary | ICD-10-CM

## 2023-02-14 DIAGNOSIS — E559 Vitamin D deficiency, unspecified: Secondary | ICD-10-CM | POA: Diagnosis not present

## 2023-02-14 DIAGNOSIS — E210 Primary hyperparathyroidism: Secondary | ICD-10-CM | POA: Diagnosis not present

## 2023-02-14 DIAGNOSIS — R809 Proteinuria, unspecified: Secondary | ICD-10-CM

## 2023-02-14 DIAGNOSIS — I10 Essential (primary) hypertension: Secondary | ICD-10-CM | POA: Diagnosis not present

## 2023-02-14 DIAGNOSIS — Z1231 Encounter for screening mammogram for malignant neoplasm of breast: Secondary | ICD-10-CM | POA: Diagnosis not present

## 2023-02-14 DIAGNOSIS — E785 Hyperlipidemia, unspecified: Secondary | ICD-10-CM | POA: Diagnosis not present

## 2023-02-14 DIAGNOSIS — E1129 Type 2 diabetes mellitus with other diabetic kidney complication: Secondary | ICD-10-CM | POA: Diagnosis not present

## 2023-02-14 DIAGNOSIS — Z9889 Other specified postprocedural states: Secondary | ICD-10-CM

## 2023-02-14 DIAGNOSIS — E1169 Type 2 diabetes mellitus with other specified complication: Secondary | ICD-10-CM | POA: Diagnosis not present

## 2023-02-14 DIAGNOSIS — E213 Hyperparathyroidism, unspecified: Secondary | ICD-10-CM | POA: Diagnosis not present

## 2023-02-14 DIAGNOSIS — M170 Bilateral primary osteoarthritis of knee: Secondary | ICD-10-CM | POA: Diagnosis not present

## 2023-02-14 MED ORDER — CARVEDILOL 3.125 MG PO TABS: 3.1250 mg | ORAL_TABLET | Freq: Two times a day (BID) | ORAL | 0 refills | Status: DC

## 2023-02-15 LAB — PTH, INTACT AND CALCIUM
Calcium: 10 mg/dL (ref 8.6–10.4)
PTH: 75 pg/mL (ref 16–77)

## 2023-02-15 LAB — TSH+FREE T4: TSH W/REFLEX TO FT4: 1.18 mIU/L (ref 0.40–4.50)

## 2023-02-22 ENCOUNTER — Other Ambulatory Visit: Payer: Self-pay | Admitting: Family Medicine

## 2023-02-22 DIAGNOSIS — E1169 Type 2 diabetes mellitus with other specified complication: Secondary | ICD-10-CM

## 2023-03-01 ENCOUNTER — Telehealth: Payer: Self-pay | Admitting: Family Medicine

## 2023-03-01 NOTE — Telephone Encounter (Signed)
Copied from CRM [id number]. Topic: General - Other >> Mar 01, 2023 11:34 AM Epimenio Foot F wrote: Reason for CRM: Pt says she had blood work done and Dr. Carlynn Purl was supposed to send a copy of the results to Dr. Sheliah Hatch her surgeon. Pt says she was told to remind Dr. Carlynn Purl if it hadn't been done. Pt is calling in to check on if it was sent.

## 2023-03-01 NOTE — Telephone Encounter (Signed)
Confirmed labs were sent to Dr. Sheliah Hatch. Patient notified.

## 2023-04-04 ENCOUNTER — Other Ambulatory Visit: Payer: Self-pay | Admitting: Family Medicine

## 2023-04-04 DIAGNOSIS — M25561 Pain in right knee: Secondary | ICD-10-CM

## 2023-04-05 NOTE — Telephone Encounter (Signed)
Unable to refill per protocol, Rx expired. Discontinued 02/14/23.  Requested Prescriptions  Pending Prescriptions Disp Refills   meloxicam (MOBIC) 15 MG tablet [Pharmacy Med Name: MELOXICAM 15 MG TABLET] 30 tablet 1    Sig: TAKE 1/2 TO 1 TABLET (7.5-15 MG TOTAL) BY MOUTH DAILY     Analgesics:  COX2 Inhibitors Failed - 04/04/2023  3:39 PM      Failed - Manual Review: Labs are only required if the patient has taken medication for more than 8 weeks.      Failed - ALT in normal range and within 360 days    ALT  Date Value Ref Range Status  08/15/2022 39 (H) 6 - 29 U/L Final   SGPT (ALT)  Date Value Ref Range Status  02/05/2015 15 U/L Final    Comment:    14-54 NOTE: New Reference Range  01/04/15          Passed - HGB in normal range and within 360 days    Hemoglobin  Date Value Ref Range Status  01/01/2023 12.9 12.0 - 15.0 g/dL Final   HGB  Date Value Ref Range Status  02/05/2015 12.2 12.0 - 16.0 g/dL Final         Passed - Cr in normal range and within 360 days    Creat  Date Value Ref Range Status  08/15/2022 0.96 0.50 - 1.05 mg/dL Final   Creatinine, Ser  Date Value Ref Range Status  01/02/2023 0.92 0.44 - 1.00 mg/dL Final   Creatinine, Urine  Date Value Ref Range Status  08/15/2022 53 20 - 275 mg/dL Final         Passed - HCT in normal range and within 360 days    HCT  Date Value Ref Range Status  01/01/2023 39.6 36.0 - 46.0 % Final  02/05/2015 38.2 35.0 - 47.0 % Final         Passed - AST in normal range and within 360 days    AST  Date Value Ref Range Status  08/15/2022 32 10 - 35 U/L Final   SGOT(AST)  Date Value Ref Range Status  02/05/2015 20 U/L Final    Comment:    15-41 NOTE: New Reference Range  01/04/15          Passed - eGFR is 30 or above and within 360 days    GFR, Est African American  Date Value Ref Range Status  02/16/2020 75 > OR = 60 mL/min/1.65m2 Final   GFR, Est Non African American  Date Value Ref Range Status   02/16/2020 64 > OR = 60 mL/min/1.60m2 Final   GFR, Estimated  Date Value Ref Range Status  01/02/2023 >60 >60 mL/min Final    Comment:    (NOTE) Calculated using the CKD-EPI Creatinine Equation (2021)    eGFR  Date Value Ref Range Status  08/15/2022 65 > OR = 60 mL/min/1.26m2 Final         Passed - Patient is not pregnant      Passed - Valid encounter within last 12 months    Recent Outpatient Visits           1 month ago Controlled type 2 diabetes mellitus with microalbuminuria, without long-term current use of insulin [facility])   [place] [facility] Alba Cory, MD   5 months ago Upper respiratory tract infection due to COVID-19 virus   [facility] Danelle Berry, PA-C   7 months ago Dyslipidemia associated with  type 2 diabetes mellitus [facility])   [place] [facility] Alba Cory, MD   8 months ago Acute pain of right knee   [facility] Danelle Berry, PA-C   1 year ago Dyslipidemia associated with type 2 diabetes mellitus [facility])   [place] [facility] Alba Cory, MD       Future Appointments             In 1 week  [facility], [facility]   In 3 months Alba Cory, MD [facility]

## 2023-04-18 ENCOUNTER — Ambulatory Visit (INDEPENDENT_AMBULATORY_CARE_PROVIDER_SITE_OTHER): Payer: Medicare Other

## 2023-04-18 VITALS — BP 126/74 | Ht 62.0 in | Wt 237.7 lb

## 2023-04-18 DIAGNOSIS — Z0000 Encounter for general adult medical examination without abnormal findings: Secondary | ICD-10-CM

## 2023-04-18 NOTE — Progress Notes (Signed)
Subjective:   Kathy Price is a 67 y.o. female who presents for Medicare Annual (Subsequent) preventive examination.  Visit Complete: In person  Patient Medicare AWV questionnaire was completed by the patient on (not done); I have confirmed that all information answered by patient is correct and no changes since this date.  Review of Systems    Cardiac Risk Factors include: advanced age (>40men, >30 women);dyslipidemia;hypertension;obesity (BMI >30kg/m2)    Objective:    Today's Vitals   04/18/23 0805 04/18/23 0809  BP: 126/74   Weight: 237 lb 11.2 oz (107.8 kg)   Height: 5\' 2"  (1.575 m)   PainSc:  2    Body mass index is 43.48 kg/m.     04/18/2023    8:19 AM 01/01/2023    5:55 PM 12/31/2022    7:53 AM 04/12/2022    8:33 AM 04/06/2021    8:37 AM 05/09/2020    8:25 AM 06/28/2017    9:03 AM  Advanced Directives  Does Patient Have a Medical Advance Directive? No No No No No No No  Would patient like information on creating a medical advance directive?  No - Patient declined  Yes (MAU/Ambulatory/Procedural Areas - Information given) Yes (MAU/Ambulatory/Procedural Areas - Information given) No - Patient declined     Current Medications (verified) Outpatient Encounter Medications as of 04/18/2023  Medication Sig   acetaminophen (TYLENOL) 500 MG tablet Take 1,000 mg by mouth every 6 (six) hours as needed for moderate pain.   aspirin EC 81 MG tablet Take 1 tablet by mouth daily.   atorvastatin (LIPITOR) 40 MG tablet TAKE 1 TABLET BY MOUTH EVERY DAY   carvedilol (COREG) 3.125 MG tablet Take 1 tablet (3.125 mg total) by mouth 2 (two) times daily with a meal.   Cholecalciferol (VITAMIN D3) 2000 UNITS capsule Take 1 capsule by mouth daily.   empagliflozin (JARDIANCE) 25 MG TABS tablet Take 1 tablet (25 mg total) by mouth daily.   Multiple Vitamins-Minerals (WOMENS MULTIVITAMIN PO) Take 1 tablet by mouth.    sacubitril-valsartan (ENTRESTO) 24-26 MG Take 1 tablet by mouth 2 (two) times  daily.   spironolactone (ALDACTONE) 25 MG tablet Take 12.5 mg by mouth daily.   No facility-administered encounter medications on file as of 04/18/2023.    Allergies (verified) Metoprolol, Pantoprazole, and Sulfa antibiotics   History: Past Medical History:  Diagnosis Date   Arthritis    Back pain    Breathing-related sleep disorder    CAD (coronary artery disease)    Diabetes mellitus without complication (HCC)    History of MI (myocardial infarction)    Hyperlipidemia    Hypertension    Myocardial infarction (HCC)    Obese    Postablative ovarian failure    Vitamin D deficiency    Past Surgical History:  Procedure Laterality Date   ABDOMINAL HYSTERECTOMY     COLONOSCOPY     COLONOSCOPY WITH PROPOFOL N/A 05/09/2020   Procedure: COLONOSCOPY WITH PROPOFOL;  Surgeon: Wyline Mood, MD;  Location: [facility] ENDOSCOPY;  Service: Gastroenterology;  Laterality: N/A;   CORONARY ANGIOPLASTY  2002   stents   ESOPHAGOGASTRODUODENOSCOPY (EGD) WITH PROPOFOL N/A 11/05/2016   Procedure: ESOPHAGOGASTRODUODENOSCOPY (EGD) WITH PROPOFOL;  Surgeon: Wyline Mood, MD;  Location: [facility] ENDOSCOPY;  Service: Endoscopy;  Laterality: N/A;   PARATHYROIDECTOMY N/A 01/01/2023   Procedure: LEFT SUPERIOR AND INFERIOR PARATHYROIDECTOMY;  Surgeon: Kinsinger, De Blanch, MD;  Location: [facility] ORS;  Service: General;  Laterality: N/A;   Family History  Problem Relation Age of  Onset   Diabetes Mother    Hypertension Mother    Cancer Father    CAD Brother    Breast cancer Sister 83   Social History   Socioeconomic History   Marital status: Married    Spouse name: [name]   Number of children: 3   Years of education: Not on file   Highest education level: GED or equivalent  Occupational History   Occupation: retired  Tobacco Use   Smoking status: Former    Packs/day: 1.00    Years: 25.00    Additional pack years: 0.00    Total pack years: 25.00    Types: Cigarettes    Quit date: 09/06/2000    Years since  quitting: 22.6    Passive exposure: Current (husband smokes inside)   Smokeless tobacco: Never   Tobacco comments:    Smoking cessation materials not required  Vaping Use   Vaping Use: Never used  Substance and Sexual Activity   Alcohol use: No    Alcohol/week: 0.0 standard drinks of alcohol   Drug use: No   Sexual activity: Yes    Partners: Male  Other Topics Concern   Not on file  Social History Narrative   Not on file   Social Determinants of Health   Financial Resource Strain: Low Risk  (04/18/2023)   Overall Financial Resource Strain (CARDIA)    Difficulty of Paying Living Expenses: Not hard at all  Food Insecurity: No Food Insecurity (04/18/2023)   Hunger Vital Sign    Worried About Running Out of Food in the Last Year: Never true    Ran Out of Food in the Last Year: Never true  Transportation Needs: No Transportation Needs (04/18/2023)   PRAPARE - Administrator, Civil Service (Medical): No    Lack of Transportation (Non-Medical): No  Physical Activity: Insufficiently Active (04/18/2023)   Exercise Vital Sign    Days of Exercise per Week: 2 days    Minutes of Exercise per Session: 30 min  Stress: No Stress Concern Present (04/18/2023)   [employer] of Occupational Health - Occupational Stress Questionnaire    Feeling of Stress : Not at all  Social Connections: Socially Integrated (04/18/2023)   Social Connection and Isolation Panel [NHANES]    Frequency of Communication with Friends and Family: More than three times a week    Frequency of Social Gatherings with Friends and Family: More than three times a week    Attends Religious Services: More than 4 times per year    Active Member of [employer] or Organizations: Yes    Attends Engineer, structural: More than 4 times per year    Marital Status: Married    Tobacco Counseling Counseling given: Not Answered Tobacco comments: Smoking cessation materials not required   Clinical  Intake:  Pre-visit preparation completed: Yes  Pain : 0-10 Pain Score: 2  Pain Type: Chronic pain Pain Location: Knee Pain Orientation: Right Pain Descriptors / Indicators: Aching, Sore Pain Onset: More than a month ago Pain Frequency: Intermittent Pain Relieving Factors: elevating leg, tylenol, cold compress  Pain Relieving Factors: elevating leg, tylenol, cold compress  BMI - recorded: 43.48 Nutritional Status: BMI > 30  Obese Nutritional Risks: None Diabetes: No  How often do you need to have someone help you when you read instructions, pamphlets, or other written materials from your doctor or pharmacy?: 1 - Never  Interpreter Needed?: No  Comments: lives with husband and granddaughter Information entered by ::  B.Reynold Mantell,LPN   Activities of Daily Living    04/18/2023    8:19 AM 02/14/2023    8:03 AM  In your present state of health, do you have any difficulty performing the following activities:  Hearing? 0 0  Vision? 0 0  Difficulty concentrating or making decisions? 0 0  Walking or climbing stairs? 0 0  Dressing or bathing? 0 0  Doing errands, shopping? 0 0  Preparing Food and eating ? N   Using the Toilet? N   In the past six months, have you accidently leaked urine? N   Do you have problems with loss of bowel control? N   Managing your Medications? N   Managing your Finances? N   Housekeeping or managing your Housekeeping? N     Patient Care Team: Alba Cory, MD as PCP - General (Family Medicine) Gaspar Cola, [facility] (Inactive) (Pharmacist) Lamar Blinks, MD as Consulting Physician (Cardiology)  Indicate any recent Medical Services you may have received from other than [facility] providers in the past year (date may be approximate).     Assessment:   This is a routine wellness examination for H[place].  Hearing/Vision screen Hearing Screening - Comments:: Adequate hearing Vision Screening - Comments:: Adequate vision;readers only [facility]  Vision;not current with exams  Dietary issues and exercise activities discussed:     Goals Addressed             This Visit's Progress    Monitor and Manage My Blood Sugar-Diabetes Type 2   On track    Timeframe:  Long-Range Goal Priority:  High Start Date: 12/13/2021                            Expected End Date: 12/13/2022                      Follow Up within 90 days   -Check blood sugar daily before breakfast - check blood sugar if I feel it is too high or too low - enter blood sugar readings and medication or insulin into daily log - take the blood sugar log to all doctor visits    Why is this important?   Checking your blood sugar at home helps to keep it from getting very high or very low.  Writing the results in a diary or log helps the doctor know how to care for you.  Your blood sugar log should have the time, date and the results.  Also, write down the amount of insulin or other medicine that you take.  Other information, like what you ate, exercise done and how you were feeling, will also be helpful.     Notes:      Track and Manage Fluids and Swelling-Heart Failure   On track    Timeframe:  Long-Range Goal Priority:  High Start Date:                             Expected End Date:                       Follow Up within 90 days   - call office if I gain more than 2 pounds in one day or 5 pounds in one week - use salt in moderation - watch for swelling in feet, ankles and legs every day - weigh myself daily  Why is this important?   It is important to check your weight daily and watch how much salt and liquids you have.  It will help you to manage your heart failure.    Notes:        Depression Screen    04/18/2023    8:16 AM 02/14/2023    8:03 AM 10/25/2022   12:15 PM 08/15/2022    8:29 AM 08/02/2022    1:44 PM 04/12/2022    8:33 AM 02/13/2022    8:38 AM  PHQ 2/9 Scores  PHQ - 2 Score 0 0 0 0 0 0 0  PHQ- 9 Score  0 0 0 0  0    Fall Risk     04/18/2023    8:13 AM 02/14/2023    8:02 AM 10/25/2022   12:15 PM 08/15/2022    8:29 AM 08/02/2022    1:44 PM  Fall Risk   Falls in the past year? 0 0 0 0 0  Number falls in past yr: 0 0 0 0 0  Injury with Fall? 0 0 0 0 0  Risk for fall due to :  No Fall Risks No Fall Risks No Fall Risks No Fall Risks  Follow up Education provided;Falls prevention discussed Falls prevention discussed Falls prevention discussed Falls prevention discussed Falls prevention discussed;Education provided    MEDICARE RISK AT HOME:   TIMED UP AND GO:  Was the test performed?  Yes  Length of time to ambulate 10 feet: 12 sec Gait slow and steady with assistive device    Cognitive Function:        04/18/2023    8:23 AM  6CIT Screen  What Year? 0 points  What month? 0 points  What time? 0 points  Count back from 20 0 points  Months in reverse 0 points  Repeat phrase 0 points  Total Score 0 points    Immunizations Immunization History  Administered Date(s) Administered   Fluad Quad(high Dose 65+) 08/03/2020, 08/14/2021, 08/15/2022   Influenza Inj Mdck Quad Pf 07/17/2019   Influenza, Seasonal, Injecte, Preservative Fre 07/29/2012, 06/29/2014   Influenza,inj,Quad PF,6+ Mos 09/29/2013, 06/28/2017, 08/28/2018   Influenza-Unspecified 06/29/2014, 08/25/2015, 08/16/2016   Moderna Sars-Covid-2 Vaccination 12/26/2019, 01/23/2020   PNEUMOCOCCAL CONJUGATE-20 09/15/2021   Pneumococcal Conjugate-13 04/07/2010, 04/05/2020   Pneumococcal Polysaccharide-23 04/07/2010, 12/30/2014, 09/07/2015   Td 04/07/2010   Tdap 04/07/2010, 11/15/2020   Zoster, Live 09/25/2016    TDAP status: Up to date  Flu Vaccine status: Up to date  Pneumococcal vaccine status: Up to date  Covid-19 vaccine status: Completed vaccines  Qualifies for Shingles Vaccine? Yes   Zostavax completed No   Shingrix Completed?: No.    Education has been provided regarding the importance of this vaccine. Patient has been advised to call  insurance company to determine out of pocket expense if they have not yet received this vaccine. Advised may also receive vaccine at local pharmacy or Health Dept. Verbalized acceptance and understanding.  Screening Tests Health Maintenance  Topic Date Due   OPHTHALMOLOGY EXAM  05/05/2022   COVID-19 Vaccine (3 - 2023-24 season) 06/29/2022   Zoster Vaccines- Shingrix (1 of 2) 05/15/2023 (Originally 01/24/2005)   INFLUENZA VACCINE  05/30/2023   HEMOGLOBIN A1C  07/03/2023   Diabetic kidney evaluation - Urine ACR  08/16/2023   Diabetic kidney evaluation - eGFR measurement  01/02/2024   FOOT EXAM  02/14/2024   Medicare Annual Wellness (AWV)  04/17/2024   MAMMOGRAM  04/23/2024   Colonoscopy  05/09/2025   DTaP/Tdap/Td (4 - Td or Tdap) 11/15/2030   Pneumonia Vaccine 42+ Years old  Completed   DEXA SCAN  Completed   Hepatitis C Screening  Completed   HPV VACCINES  Aged Out    Health Maintenance  Health Maintenance Due  Topic Date Due   OPHTHALMOLOGY EXAM  05/05/2022   COVID-19 Vaccine (3 - 2023-24 season) 06/29/2022    Colorectal cancer screening: Type of screening: Colonoscopy. Completed yes. Repeat every 5 years  Mammogram status: Completed yes. Repeat every yearappt June 2027  Bone Density status: Completed yes. Results reflect: Bone density results: NORMAL. Repeat every 5 years.  Lung Cancer Screening: (Low Dose CT Chest recommended if Age 32-80 years, 20 pack-year currently smoking OR have quit w/in 15years.) does not qualify.   Lung Cancer Screening Referral: no  Additional Screening:  Hepatitis C Screening: does not qualify; Completed yes  Vision Screening: Recommended annual ophthalmology exams for early detection of glaucoma and other disorders of the eye. Is the patient up to date with their annual eye exam?  No  Who is the provider or what is the name of the office in which the patient attends annual eye exams? [facility];insurance change requires finding a  new If pt is not established with a provider, would they like to be referred to a provider to establish care? No .   Dental Screening: Recommended annual dental exams for proper oral hygiene  Diabetic Foot Exam: n/a  Community Resource Referral / Chronic Care Management: CRR required this visit?  No   CCM required this visit?  No     Plan:     I have personally reviewed and noted the following in the patient's chart:   Medical and social history Use of alcohol, tobacco or illicit drugs  Current medications and supplements including opioid prescriptions. Patient is not currently taking opioid prescriptions. Functional ability and status Nutritional status Physical activity Advanced directives List of other physicians Hospitalizations, surgeries, and ER visits in previous 12 months Vitals Screenings to include cognitive, depression, and falls Referrals and appointments  In addition, I have reviewed and discussed with patient certain preventive protocols, quality metrics, and best practice recommendations. A written personalized care plan for preventive services as well as general preventive health recommendations were provided to patient.     Sue Lush, LPN   [id number]   After Visit Summary: (MyChart) Due to this being a telephonic visit, the after visit summary with patients personalized plan was offered to patient via MyChart   Nurse Notes: The patient states she is doing well and has no concerns or questions at this time.

## 2023-04-18 NOTE — Patient Instructions (Signed)
[name] , Thank you for taking time to come for your Medicare Wellness Visit. I appreciate your ongoing commitment to your health goals. Please review the following plan we discussed and let me know if I can assist you in the future.   These are the goals we discussed:  Goals      Monitor and Manage My Blood Sugar-Diabetes Type 2     Timeframe:  Long-Range Goal Priority:  High Start Date: 12/13/2021                            Expected End Date: 12/13/2022                      Follow Up within 90 days   -Check blood sugar daily before breakfast - check blood sugar if I feel it is too high or too low - enter blood sugar readings and medication or insulin into daily log - take the blood sugar log to all doctor visits    Why is this important?   Checking your blood sugar at home helps to keep it from getting very high or very low.  Writing the results in a diary or log helps the doctor know how to care for you.  Your blood sugar log should have the time, date and the results.  Also, write down the amount of insulin or other medicine that you take.  Other information, like what you ate, exercise done and how you were feeling, will also be helpful.     Notes:      Track and Manage Fluids and Swelling-Heart Failure     Timeframe:  Long-Range Goal Priority:  High Start Date:                             Expected End Date:                       Follow Up within 90 days   - call office if I gain more than 2 pounds in one day or 5 pounds in one week - use salt in moderation - watch for swelling in feet, ankles and legs every day - weigh myself daily    Why is this important?   It is important to check your weight daily and watch how much salt and liquids you have.  It will help you to manage your heart failure.    Notes:      Weight (lb) < 225 lb (102.1 kg)     Pt states she would like to lose weight over the next year with healthy eating and physical activity         This  is a list of the screening recommended for you and due dates:  Health Maintenance  Topic Date Due   Eye exam for diabetics  05/05/2022   COVID-19 Vaccine (3 - 2023-24 season) 06/29/2022   Zoster (Shingles) Vaccine (1 of 2) 05/15/2023*   Flu Shot  05/30/2023   Hemoglobin A1C  07/03/2023   Yearly kidney health urinalysis for diabetes  08/16/2023   Yearly kidney function blood test for diabetes  01/02/2024   Complete foot exam   02/14/2024   Medicare Annual Wellness Visit  04/17/2024   Mammogram  04/23/2024   Colon Cancer Screening  05/09/2025   DTaP/Tdap/Td vaccine (4 - Td or Tdap) 11/15/2030  Pneumonia Vaccine  Completed   DEXA scan (bone density measurement)  Completed   Hepatitis C Screening  Completed   HPV Vaccine  Aged Out  *Topic was postponed. The date shown is not the original due date.    Advanced directives: no  Conditions/risks identified: none  Next appointment: Follow up in one year for your annual wellness visit 04/23/2024 @ 8:15am in person   Preventive Care 65 Years and Older, Female Preventive care refers to lifestyle choices and visits with your health care provider that can promote health and wellness. What does preventive care include? A yearly physical exam. This is also called an annual well check. Dental exams once or twice a year. Routine eye exams. Ask your health care provider how often you should have your eyes checked. Personal lifestyle choices, including: Daily care of your teeth and gums. Regular physical activity. Eating a healthy diet. Avoiding tobacco and drug use. Limiting alcohol use. Practicing safe sex. Taking low-dose aspirin every day. Taking vitamin and mineral supplements as recommended by your health care provider. What happens during an annual well check? The services and screenings done by your health care provider during your annual well check will depend on your age, overall health, lifestyle risk factors, and family history  of disease. Counseling  Your health care provider may ask you questions about your: Alcohol use. Tobacco use. Drug use. Emotional well-being. Home and relationship well-being. Sexual activity. Eating habits. History of falls. Memory and ability to understand (cognition). Work and work Astronomer. Reproductive health. Screening  You may have the following tests or measurements: Height, weight, and BMI. Blood pressure. Lipid and cholesterol levels. These may be checked every 5 years, or more frequently if you are over 57 years old. Skin check. Lung cancer screening. You may have this screening every year starting at age 57 if you have a 30-pack-year history of smoking and currently smoke or have quit within the past 15 years. Fecal occult blood test (FOBT) of the stool. You may have this test every year starting at age 42. Flexible sigmoidoscopy or colonoscopy. You may have a sigmoidoscopy every 5 years or a colonoscopy every 10 years starting at age 27. Hepatitis C blood test. Hepatitis B blood test. Sexually transmitted disease (STD) testing. Diabetes screening. This is done by checking your blood sugar (glucose) after you have not eaten for a while (fasting). You may have this done every 1-3 years. Bone density scan. This is done to screen for osteoporosis. You may have this done starting at age 61. Mammogram. This may be done every 1-2 years. Talk to your health care provider about how often you should have regular mammograms. Talk with your health care provider about your test results, treatment options, and if necessary, the need for more tests. Vaccines  Your health care provider may recommend certain vaccines, such as: Influenza vaccine. This is recommended every year. Tetanus, diphtheria, and acellular pertussis (Tdap, Td) vaccine. You may need a Td booster every 10 years. Zoster vaccine. You may need this after age 10. Pneumococcal 13-valent conjugate (PCV13) vaccine. One  dose is recommended after age 32. Pneumococcal polysaccharide (PPSV23) vaccine. One dose is recommended after age 86. Talk to your health care provider about which screenings and vaccines you need and how often you need them. This information is not intended to replace advice given to you by your health care provider. Make sure you discuss any questions you have with your health care provider. Document Released: 11/11/2015 Document Revised:  07/04/2016 Document Reviewed: 08/16/2015 Elsevier Interactive Patient Education  2017 [employer].  Fall Prevention in the Home Falls can cause injuries. They can happen to people of all ages. There are many things you can do to make your home safe and to help prevent falls. What can I do on the outside of my home? Regularly fix the edges of walkways and driveways and fix any cracks. Remove anything that might make you trip as you walk through a door, such as a raised step or threshold. Trim any bushes or trees on the path to your home. Use bright outdoor lighting. Clear any walking paths of anything that might make someone trip, such as rocks or tools. Regularly check to see if handrails are loose or broken. Make sure that both sides of any steps have handrails. Any raised decks and porches should have guardrails on the edges. Have any leaves, snow, or ice cleared regularly. Use sand or salt on walking paths during winter. Clean up any spills in your garage right away. This includes oil or grease spills. What can I do in the bathroom? Use night lights. Install grab bars by the toilet and in the tub and shower. Do not use towel bars as grab bars. Use non-skid mats or decals in the tub or shower. If you need to sit down in the shower, use a plastic, non-slip stool. Keep the floor dry. Clean up any water that spills on the floor as soon as it happens. Remove soap buildup in the tub or shower regularly. Attach bath mats securely with double-sided  non-slip rug tape. Do not have throw rugs and other things on the floor that can make you trip. What can I do in the bedroom? Use night lights. Make sure that you have a light by your bed that is easy to reach. Do not use any sheets or blankets that are too big for your bed. They should not hang down onto the floor. Have a firm chair that has side arms. You can use this for support while you get dressed. Do not have throw rugs and other things on the floor that can make you trip. What can I do in the kitchen? Clean up any spills right away. Avoid walking on wet floors. Keep items that you use a lot in easy-to-reach places. If you need to reach something above you, use a strong step stool that has a grab bar. Keep electrical cords out of the way. Do not use floor polish or wax that makes floors slippery. If you must use wax, use non-skid floor wax. Do not have throw rugs and other things on the floor that can make you trip. What can I do with my stairs? Do not leave any items on the stairs. Make sure that there are handrails on both sides of the stairs and use them. Fix handrails that are broken or loose. Make sure that handrails are as long as the stairways. Check any carpeting to make sure that it is firmly attached to the stairs. Fix any carpet that is loose or worn. Avoid having throw rugs at the top or bottom of the stairs. If you do have throw rugs, attach them to the floor with carpet tape. Make sure that you have a light switch at the top of the stairs and the bottom of the stairs. If you do not have them, ask someone to add them for you. What else can I do to help prevent falls? Wear shoes that:  Do not have high heels. Have rubber bottoms. Are comfortable and fit you well. Are closed at the toe. Do not wear sandals. If you use a stepladder: Make sure that it is fully opened. Do not climb a closed stepladder. Make sure that both sides of the stepladder are locked into place. Ask  someone to hold it for you, if possible. Clearly mark and make sure that you can see: Any grab bars or handrails. First and last steps. Where the edge of each step is. Use tools that help you move around (mobility aids) if they are needed. These include: Canes. Walkers. Scooters. Crutches. Turn on the lights when you go into a dark area. Replace any light bulbs as soon as they burn out. Set up your furniture so you have a clear path. Avoid moving your furniture around. If any of your floors are uneven, fix them. If there are any pets around you, be aware of where they are. Review your medicines with your doctor. Some medicines can make you feel dizzy. This can increase your chance of falling. Ask your doctor what other things that you can do to help prevent falls. This information is not intended to replace advice given to you by your health care provider. Make sure you discuss any questions you have with your health care provider. Document Released: 08/11/2009 Document Revised: 03/22/2016 Document Reviewed: 11/19/2014 Elsevier Interactive Patient Education  2017 [employer].

## 2023-04-25 ENCOUNTER — Ambulatory Visit
Admission: RE | Admit: 2023-04-25 | Discharge: 2023-04-25 | Disposition: A | Discharge status: Home / Self Care | Payer: Medicare Other | Source: Ambulatory Visit | Attending: Family Medicine | Admitting: Family Medicine

## 2023-04-25 DIAGNOSIS — Z1231 Encounter for screening mammogram for malignant neoplasm of breast: Secondary | ICD-10-CM | POA: Insufficient documentation

## 2023-05-13 DIAGNOSIS — M1711 Unilateral primary osteoarthritis, right knee: Secondary | ICD-10-CM | POA: Diagnosis not present

## 2023-07-17 ENCOUNTER — Ambulatory Visit: Payer: Medicare Other | Admitting: Family Medicine

## 2023-07-30 ENCOUNTER — Other Ambulatory Visit: Payer: Self-pay | Admitting: Family Medicine

## 2023-08-21 ENCOUNTER — Other Ambulatory Visit: Payer: Self-pay | Admitting: Pharmacist

## 2023-08-21 ENCOUNTER — Encounter: Payer: Self-pay | Admitting: Pharmacist

## 2023-08-21 NOTE — Progress Notes (Signed)
08/21/2023 Name: Kathy Price MRN: [id number] DOB: 1955-12-08  Chief Complaint  Patient presents with   Medication Assistance    Kathy Price is a 67 y.o. year old female who was referred to the pharmacist  for assistance in managing medication access.    Subjective:  Care Team: Primary Care Provider: Alba Cory, MD ; Next Scheduled Visit: 09/02/2023 Cardiologist: Olena Heckle, MD; Next Scheduled Visit: 09/11/2023  Medication Access/Adherence  Current Pharmacy:  [facility] [address] [place] - [address] [place] [address] Phone: [phone] Fax: [phone]  [facility] - [place], [place] - [address], [address] [place] [address] Phone: [phone] Fax: [phone]   Patient reports affordability concerns with their medications: No  Patient reports access/transportation concerns to their pharmacy: No  Patient reports adherence concerns with their medications:  No     Heart Failure:  Current medications:  ACEi/ARB/ARNI: Entresto 24-26 mg twice daily SGLT2i: Jardiance 25 mg daily Beta blocker: carvedilol 3.125 mg twice daily Mineralocorticoid Receptor Antagonist: spironolactone 25 mg - 1/2 tablet daily  Current home blood pressure readings: reports typically ~120/79  Patient denies signs of swelling   Denies symptoms of hypotension such as dizziness or lightheadedness   Current medication access support:  - enrolled in patient assistance for Jardiance from [employer] through 10/29/2023 - enrolled in patient assistance for Entresto. Reports enrollment for [facility] assistance managed by Cardiology   Objective:  Lab Results  Component Value Date   HGBA1C 6.2 (H) 12/31/2022    Lab Results  Component Value Date   CREATININE 0.92 01/02/2023   BUN 12 01/02/2023   NA 138 01/02/2023   K 3.9 01/02/2023   CL 106 01/02/2023   CO2 23 01/02/2023    Lab Results  Component Value  Date   CHOL 205 (H) 08/15/2022   HDL 50 08/15/2022   LDLCALC 133 (H) 08/15/2022   TRIG 108 08/15/2022   CHOLHDL 4.1 08/15/2022   BP Readings from Last 3 Encounters:  04/18/23 126/74  02/14/23 126/72  01/02/23 128/74     Medications Reviewed Today     Reviewed by Manuela Neptune, RPH-CPP (Pharmacist) on 08/21/23 at 1517  Med List Status: <None>   Medication Order Taking? Sig Documenting Provider Last Dose Status Informant  acetaminophen (TYLENOL) 500 MG tablet [id number] Yes Take 1,000 mg by mouth every 6 (six) hours as needed for moderate pain. [provider] Taking Active Self  aspirin EC 81 MG tablet [id number] Yes Take 1 tablet by mouth daily. [provider] Taking Active Self           Med Note Doylene Canard, JENNIFER L   Tue May 15, 2016 10:41 AM)    atorvastatin (LIPITOR) 40 MG tablet [id number] Yes TAKE 1 TABLET BY MOUTH EVERY DAY Alba Cory, MD Taking Active   carvedilol (COREG) 3.125 MG tablet [id number] Yes TAKE 1 TABLET BY MOUTH TWICE A DAY WITH A MEAL Sowles, Danna Hefty, MD Taking Active   Cholecalciferol (VITAMIN D3) 2000 UNITS capsule [id number] Yes Take 1 capsule by mouth daily. [provider] Taking Active Self           Med Note Doylene Canard, JENNIFER L   Tue May 15, 2016 10:41 AM)    empagliflozin (JARDIANCE) 25 MG TABS tablet [id number] Yes Take 1 tablet (25 mg total) by mouth daily. Alba Cory, MD Taking Active Self  Multiple Vitamins-Minerals (WOMENS MULTIVITAMIN PO) [id number] Yes Take 1 tablet by  mouth.  [provider] Taking Active Self  sacubitril-valsartan (ENTRESTO) 24-26 MG [id number] Yes Take 1 tablet by mouth 2 (two) times daily. Deirdre Evener, MD Taking Active Self  spironolactone (ALDACTONE) 25 MG tablet [id number] Yes Take 12.5 mg by mouth daily. Lamar Blinks, MD Taking Active Self              Assessment/Plan:   Heart Failure: - Recommend to monitor home blood pressure and home weight, keep log of  results and have this record to review at upcoming medical appointments. Patient to contact provider office sooner if needed for readings outside of established parameters or symptoms - Will collaborate with provider, CPhT to aid patient with re-enrollment in patient assistance program for Jardiance from [facility] for 2025 calendar year   Follow Up Plan: Clinical Pharmacist will follow up with patient by telephone on 11/20/2023 at 9:30 AM   Estelle Grumbles, PharmD, [facility] [phone]

## 2023-08-21 NOTE — Patient Instructions (Signed)
Goals Addressed             This Visit's Progress    Pharmacy Goals       Please watch the mail for an envelope from [facility] containing the patient assistance program application. Please complete this application and mail back to [facility] Pharmacy Technician Noreene Larsson [place] along with a copy of your Medicare Part D prescription card and a copy of your proof of income document OR you can bring these documents to the office to have them faxed back to Attention: Pattricia Boss at Fax # [phone]   If you need to call Noreene Larsson, you can reach her at [phone]   If you need to reach out to patient assistance programs regarding refills or to find out the status of your application, you can do so by calling:   [employer] [phone]   Thank you!   Estelle Grumbles, PharmD, [facility] [phone]

## 2023-08-27 ENCOUNTER — Telehealth: Payer: Self-pay | Admitting: Pharmacy Technician

## 2023-08-27 DIAGNOSIS — Z5986 Financial insecurity: Secondary | ICD-10-CM

## 2023-08-27 NOTE — Progress Notes (Signed)
[facility] Customer service manager [facility])                                            [facility] Team    08/27/2023  Kathy Price 1956-09-09 [id number]                                      Medication Assistance Referral  Referral From:  [facility] PharmD Estelle Grumbles  Medication/Company: [name] / [employer] Patient application portion:  Mailed Provider application portion: Faxed  to Dr. Alba Cory Provider address/fax verified via: Office website  Pattricia Boss, CPhT [place]  Office: [phone] Fax: [phone] Email: Kathy Price

## 2023-08-28 ENCOUNTER — Other Ambulatory Visit: Payer: Self-pay | Admitting: Family Medicine

## 2023-08-30 NOTE — Progress Notes (Unsigned)
Name: Kathy Price   MRN: [id number]    DOB: 06/26/1956   Date:09/02/2023       Progress Note  Subjective  Chief Complaint  Follow Up  HPI  DMII: A1C was 6.2% six months ago, however she has been eating more carbohydrates and gaining weight. Today A1C is up to 6.9 %  . She denies polyphagia, polydipsia or polyuria. Urine micro was elevated was 104  she is on ARB ( on the form of Entresto) and also on Jardiance. We will recheck urine micro, lipids today and GFR. She has associated obesity, dyslipidemia, HTN. We will see if she can be approved for GLP-1 agonists. She took Ozempic in the past but stopped due to cost.    CAD: s/p MI and stent placement 08/2001,  sees  Dr. 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 Stress test only showed old lesion, no acute ischemia She has been taking apirin 81 mg daily, Entresto, spironolactone , carvedilol , Jardiance and atorvastatin. She denies diaphoresis, orthopnea or edema, she has noticed some SOB with mild to moderate activity. She is due for follow up with cardiologist . We will recheck labs today    INTERPRETATION 03/2022 MILD LV SYSTOLIC DYSFUNCTION (See above)   WITH MILD LVH  NORMAL RIGHT VENTRICULAR SYSTOLIC FUNCTION  TRIVIAL REGURGITATION NOTED (See above)  NO VALVULAR STENOSIS  TRIVIAL TR, MR, PR  EF 40%    GERD: she had EGD done by Dr. Tobi Bastos back 10/2016 and was treated for h. Pylori gastritis. She is no  longer having indigestion or epigastric pain. She states only has symptoms when eating fried food .    Morbid Obesity: Her weight was down to 234 lbs but is gradually gaining it back 239 lbs   OA knee: she is under the care of Ortho and gets steroid injections prn, she is off NSAID's, she is taking  tylenol only or topical medications for knee pain   Hyperlipidemia: LDL was up take medications daily , we will recheck labs   Hyperparathyroidism: she had both left  parathyroid  glands removed 01/02/2023, only had headaches on the post op. Last labs normal and we will keep monitoring   Patient Active Problem List   Diagnosis Date Noted   History of parathyroidectomy 01/02/2023   Primary osteoarthritis of both knees 08/15/2022   Helicobacter pylori gastritis 11/06/2016   Ischemic dilated cardiomyopathy (HCC) 01/30/2016   CAD S/P percutaneous coronary angioplasty 09/07/2015   Dyslipidemia associated with type 2 diabetes mellitus (HCC) 09/07/2015   Dyslipidemia 09/07/2015   H/O: hysterectomy 09/07/2015   H/O acute myocardial infarction 09/07/2015   Vitamin D deficiency 09/07/2015   Morbid obesity (HCC) 09/07/2015   Bradycardia 05/13/2014   Arteriosclerosis of coronary artery 05/13/2014   Essential (primary) hypertension 05/13/2014   UARS (upper airway resistance syndrome) 01/23/2013    Past Surgical History:  Procedure Laterality Date   ABDOMINAL HYSTERECTOMY     COLONOSCOPY     COLONOSCOPY WITH PROPOFOL N/A 05/09/2020   Procedure: COLONOSCOPY WITH PROPOFOL;  Surgeon: Wyline Mood, MD;  Location: [facility] ENDOSCOPY;  Service: Gastroenterology;  Laterality: N/A;   CORONARY ANGIOPLASTY  2002   stents   ESOPHAGOGASTRODUODENOSCOPY (EGD) WITH PROPOFOL N/A 11/05/2016   Procedure: ESOPHAGOGASTRODUODENOSCOPY (EGD) WITH PROPOFOL;  Surgeon: Wyline Mood, MD;  Location: [facility] ENDOSCOPY;  Service: Endoscopy;  Laterality: N/A;   PARATHYROIDECTOMY N/A 01/01/2023   Procedure: LEFT SUPERIOR AND INFERIOR PARATHYROIDECTOMY;  Surgeon: Feliciana Rossetti  Clifton Custard, MD;  Location: [facility] ORS;  Service: General;  Laterality: N/A;    Family History  Problem Relation Age of Onset   Diabetes Mother    Hypertension Mother    Cancer Father    CAD Brother    Breast cancer Sister 39    Social History   Tobacco Use   Smoking status: Former    Current packs/day: 0.00    Average packs/day: 1 pack/day for 25.0 years (25.0 ttl pk-yrs)    Types: Cigarettes    Start date: 09/07/1975    Quit date:  09/06/2000    Years since quitting: 23.0    Passive exposure: Current (husband smokes inside)   Smokeless tobacco: Never   Tobacco comments:    Smoking cessation materials not required  Substance Use Topics   Alcohol use: No    Alcohol/week: 0.0 standard drinks of alcohol     Current Outpatient Medications:    acetaminophen (TYLENOL) 500 MG tablet, Take 1,000 mg by mouth every 6 (six) hours as needed for moderate pain., Disp: , Rfl:    aspirin EC 81 MG tablet, Take 1 tablet by mouth daily., Disp: , Rfl:    atorvastatin (LIPITOR) 40 MG tablet, TAKE 1 TABLET BY MOUTH EVERY DAY, Disp: 90 tablet, Rfl: 1   carvedilol (COREG) 3.125 MG tablet, TAKE 1 TABLET BY MOUTH TWICE A DAY WITH FOOD, Disp: 180 tablet, Rfl: 0   Cholecalciferol (VITAMIN D3) 2000 UNITS capsule, Take 1 capsule by mouth daily., Disp: , Rfl:    empagliflozin (JARDIANCE) 25 MG TABS tablet, Take 1 tablet (25 mg total) by mouth daily., Disp: 90 tablet, Rfl: 3   Multiple Vitamins-Minerals (WOMENS MULTIVITAMIN PO), Take 1 tablet by mouth. , Disp: , Rfl:    sacubitril-valsartan (ENTRESTO) 24-26 MG, Take 1 tablet by mouth 2 (two) times daily., Disp: , Rfl:    spironolactone (ALDACTONE) 25 MG tablet, Take 12.5 mg by mouth daily., Disp: , Rfl:   Allergies  Allergen Reactions   Metoprolol Other (See Comments)    severe bradycardia   Pantoprazole Rash   Sulfa Antibiotics Rash and Hives    I personally reviewed active problem list, medication list, allergies, family history, social history, health maintenance with the patient/caregiver today.   ROS  Ten systems reviewed and is negative except as mentioned in HPI    Objective  Vitals:   09/02/23 0906  BP: 124/68  Pulse: 86  Resp: 16  SpO2: 99%  Weight: 239 lb (108.4 kg)  Height: 5\' 2"  (1.575 m)    Body mass index is 43.71 kg/m.  Physical Exam  Constitutional: Patient appears well-developed and well-nourished. Obese  No distress.  HEENT: head atraumatic,  normocephalic, pupils equal and reactive to light, neck supple Cardiovascular: Normal rate, regular rhythm and normal heart sounds.  No murmur heard. No BLE edema. Pulmonary/Chest: Effort normal and breath sounds normal. No respiratory distress. Abdominal: Soft.  There is no tenderness. Psychiatric: Patient has a normal mood and affect. behavior is normal. Judgment and thought content normal.   Recent Results (from the past 2160 hour(s))  POCT HgB A1C     Status: Abnormal   Collection Time: 09/02/23  9:07 AM  Result Value Ref Range   Hemoglobin A1C 6.9 (A) 4.0 - 5.6 %   HbA1c POC (<> result, manual entry)     HbA1c, POC (prediabetic range)     HbA1c, POC (controlled diabetic range)       PHQ2/9:    09/02/2023  9:06 AM 04/18/2023    8:16 AM 02/14/2023    8:03 AM 10/25/2022   12:15 PM 08/15/2022    8:29 AM  Depression screen PHQ 2/9  Decreased Interest 0 0 0 0 0  Down, Depressed, Hopeless 0 0 0 0 0  PHQ - 2 Score 0 0 0 0 0  Altered sleeping 0  0 0 0  Tired, decreased energy 0  0 0 0  Change in appetite 0  0 0 0  Feeling bad or failure about yourself  0  0 0 0  Trouble concentrating 0  0 0 0  Moving slowly or fidgety/restless 0  0 0 0  Suicidal thoughts 0  0 0 0  PHQ-9 Score 0  0 0 0    phq 9 is negative   Fall Risk:    09/02/2023    9:06 AM 04/18/2023    8:13 AM 02/14/2023    8:02 AM 10/25/2022   12:15 PM 08/15/2022    8:29 AM  Fall Risk   Falls in the past year? 0 0 0 0 0  Number falls in past yr: 0 0 0 0 0  Injury with Fall? 0 0 0 0 0  Risk for fall due to : No Fall Risks  No Fall Risks No Fall Risks No Fall Risks  Follow up Falls prevention discussed Education provided;Falls prevention discussed Falls prevention discussed Falls prevention discussed Falls prevention discussed      Functional Status Survey: Is the patient deaf or have difficulty hearing?: No Does the patient have difficulty seeing, even when wearing glasses/contacts?: No Does the patient have  difficulty concentrating, remembering, or making decisions?: No Does the patient have difficulty walking or climbing stairs?: No Does the patient have difficulty dressing or bathing?: No Does the patient have difficulty doing errands alone such as visiting a doctor's office or shopping?: No    Assessment & Plan  1. Controlled type 2 diabetes mellitus with microalbuminuria, without long-term current use of insulin (HCC)  Sample of ozempic 0.25 mg to start Dec 15 th after that titrate to 0.5 mg and assistance program to get 1 mg supply in January  Referral placed clinical pharmacy  - POCT HgB A1C - Urine Microalbumin w/creat. ratio - CBC with Differential/Platelet - COMPLETE METABOLIC PANEL WITH GFR  2. Need for immunization against influenza  - Flu Vaccine Trivalent High Dose (Fluad)  3. Ischemic dilated cardiomyopathy (HCC)  - carvedilol (COREG) 3.125 MG tablet; Take 1 tablet (3.125 mg total) by mouth 2 (two) times daily with a meal.  Dispense: 180 tablet; Refill: 1  4. Morbid obesity (HCC)  We will start GLP-1 agonist for DM and also weight control   5. Dyslipidemia associated with type 2 diabetes mellitus (HCC)  - Lipid panel - atorvastatin (LIPITOR) 40 MG tablet; Take 1 tablet (40 mg total) by mouth daily.  Dispense: 90 tablet; Refill: 1  6. Vitamin D deficiency  Continue supplements   7. CAD S/P percutaneous coronary angioplasty  Compliant with medications under the care of cardiologist   8. Gastroesophageal reflux disease without esophagitis  Controlled   9. History of parathyroidectomy  Last pth was normal , we will check it today and yearly from now on  10. Essential (primary) hypertension  At goal  - carvedilol (COREG) 3.125 MG tablet; Take 1 tablet (3.125 mg total) by mouth 2 (two) times daily with a meal.  Dispense: 180 tablet; Refill: 1

## 2023-09-02 ENCOUNTER — Other Ambulatory Visit: Payer: Medicare Other | Admitting: Pharmacist

## 2023-09-02 ENCOUNTER — Ambulatory Visit (INDEPENDENT_AMBULATORY_CARE_PROVIDER_SITE_OTHER): Payer: Medicare Other | Admitting: Family Medicine

## 2023-09-02 ENCOUNTER — Encounter: Payer: Self-pay | Admitting: Pharmacist

## 2023-09-02 ENCOUNTER — Encounter: Payer: Self-pay | Admitting: Family Medicine

## 2023-09-02 VITALS — BP 124/68 | HR 86 | Resp 16 | Ht 62.0 in | Wt 239.0 lb

## 2023-09-02 DIAGNOSIS — E785 Hyperlipidemia, unspecified: Secondary | ICD-10-CM | POA: Diagnosis not present

## 2023-09-02 DIAGNOSIS — I255 Ischemic cardiomyopathy: Secondary | ICD-10-CM | POA: Diagnosis not present

## 2023-09-02 DIAGNOSIS — E1169 Type 2 diabetes mellitus with other specified complication: Secondary | ICD-10-CM

## 2023-09-02 DIAGNOSIS — E1129 Type 2 diabetes mellitus with other diabetic kidney complication: Secondary | ICD-10-CM

## 2023-09-02 DIAGNOSIS — E559 Vitamin D deficiency, unspecified: Secondary | ICD-10-CM

## 2023-09-02 DIAGNOSIS — Z7984 Long term (current) use of oral hypoglycemic drugs: Secondary | ICD-10-CM

## 2023-09-02 DIAGNOSIS — K219 Gastro-esophageal reflux disease without esophagitis: Secondary | ICD-10-CM

## 2023-09-02 DIAGNOSIS — I2510 Atherosclerotic heart disease of native coronary artery without angina pectoris: Secondary | ICD-10-CM

## 2023-09-02 DIAGNOSIS — R809 Proteinuria, unspecified: Secondary | ICD-10-CM

## 2023-09-02 DIAGNOSIS — I10 Essential (primary) hypertension: Secondary | ICD-10-CM | POA: Diagnosis not present

## 2023-09-02 DIAGNOSIS — I420 Dilated cardiomyopathy: Secondary | ICD-10-CM | POA: Diagnosis not present

## 2023-09-02 DIAGNOSIS — Z23 Encounter for immunization: Secondary | ICD-10-CM

## 2023-09-02 DIAGNOSIS — Z9089 Acquired absence of other organs: Secondary | ICD-10-CM

## 2023-09-02 DIAGNOSIS — Z9889 Other specified postprocedural states: Secondary | ICD-10-CM | POA: Diagnosis not present

## 2023-09-02 LAB — POCT GLYCOSYLATED HEMOGLOBIN (HGB A1C): Hemoglobin A1C: 6.9 % — AB (ref 4.0–5.6)

## 2023-09-02 MED ORDER — ATORVASTATIN CALCIUM 40 MG PO TABS: 40.0000 mg | ORAL_TABLET | Freq: Every day | ORAL | 1 refills | Status: DC

## 2023-09-02 MED ORDER — OZEMPIC (1 MG/DOSE) 4 MG/3ML ~~LOC~~ SOPN
1.0000 mg | PEN_INJECTOR | SUBCUTANEOUS | 3 refills | Status: DC
Start: 2023-09-02 — End: 2024-02-10

## 2023-09-02 MED ORDER — CARVEDILOL 3.125 MG PO TABS
3.1250 mg | ORAL_TABLET | Freq: Two times a day (BID) | ORAL | 1 refills | Status: DC
Start: 2023-09-02 — End: 2024-05-07

## 2023-09-02 MED ORDER — OZEMPIC (0.25 OR 0.5 MG/DOSE) 2 MG/3ML ~~LOC~~ SOPN
0.2500 mg | PEN_INJECTOR | SUBCUTANEOUS | Status: DC
Start: 2023-09-02 — End: 2023-12-02

## 2023-09-02 NOTE — Patient Instructions (Addendum)
Call cardiologist to schedule a visit Get Shingrix at local pharmacy Cut down on carbohydrates Start sample of Ozempic at 0.25 mg dose for 2 weeks, after that 0.5 mg weekly  Contact us early January if you don't hear from clinical pharmacist for assistance program

## 2023-09-02 NOTE — Progress Notes (Signed)
   09/02/2023 Name: Kathy Price MRN: [id number] DOB: May 28, 1956  Chief Complaint  Patient presents with   Medication Assistance    Kathy Price is a 67 y.o. year old female who was referred to the pharmacist by their PCP for assistance in managing medication access.   Receive a message from PCP today advising A1C is up to 6.9 % today and interested in starting her on Ozempic both for blood sugar control and weight control benefit.  Collaborate with provider regarding Ozempic patient assistance program. Provider plans to provide patient with sample of Ozempic 0.25 mg to start to start Dec 15th after that titrate to 0.5 mg and the apply for assistance program to get 1 mg supply in January.  Follow up with patient today by telephone.  Note already collaborating with provider, CPhT to aid patient with re-enrollment in patient assistance program for Jardiance from [facility] for 2025 calendar year  - Today patient confirms that she has received this application in the mail and is planning to complete and send this back to CPhT  Patient reports that she has used Ozempic in the past. Patient confirms understanding of plan from PCP for restarting Ozempic next month.  Objective:  Lab Results  Component Value Date   HGBA1C 6.9 (A) 09/02/2023     Assessment/Plan:   Will collaborate with provider, CPhT to aid patient with enrollment in patient assistance program for Ozempic from [employer] for 2025 calendar year.  Will send patient MyChart message with web address to the "how to take" video/information for Ozempic from the manufacturer website as requested.    Follow Up Plan: Clinical Pharmacist will follow up with patient by telephone on 11/20/2023 at 9:30 AM    Estelle Grumbles, PharmD, [facility] [phone]

## 2023-09-02 NOTE — Patient Instructions (Signed)
Goals Addressed             This Visit's Progress    Pharmacy Goals       Please watch the mail for an envelope from [facility] containing the patient assistance program application. Please complete this application and mail back to [facility] Technician Noreene Larsson Simcox along with a copy of your Medicare Part D prescription card and a copy of your proof of income document OR you can bring these documents to the office to have them faxed back to Attention: Pattricia Boss at Fax # [phone]   If you need to call Noreene Larsson, you can reach her at [phone]   If you need to reach out to patient assistance programs regarding refills or to find out the status of your application, you can do so by calling:   [employer] [phone] Novo Nordisk at [phone]   Thank you!   Estelle Grumbles, PharmD, [facility] [phone]

## 2023-09-03 LAB — CBC WITH DIFFERENTIAL/PLATELET
Absolute Lymphocytes: 2330 {cells}/uL (ref 850–3900)
Absolute Monocytes: 333 {cells}/uL (ref 200–950)
Basophils Absolute: 38 {cells}/uL (ref 0–200)
Basophils Relative: 0.6 %
Eosinophils Absolute: 211 {cells}/uL (ref 15–500)
Eosinophils Relative: 3.3 %
HCT: 39.9 % (ref 35.0–45.0)
Hemoglobin: 13.4 g/dL (ref 11.7–15.5)
MCH: 28.8 pg (ref 27.0–33.0)
MCHC: 33.6 g/dL (ref 32.0–36.0)
MCV: 85.8 fL (ref 80.0–100.0)
MPV: 10.6 fL (ref 7.5–12.5)
Monocytes Relative: 5.2 %
Neutro Abs: 3488 {cells}/uL (ref 1500–7800)
Neutrophils Relative %: 54.5 %
Platelets: 273 10*3/uL (ref 140–400)
RBC: 4.65 10*6/uL (ref 3.80–5.10)
RDW: 13.5 % (ref 11.0–15.0)
Total Lymphocyte: 36.4 %
WBC: 6.4 10*3/uL (ref 3.8–10.8)

## 2023-09-03 LAB — COMPLETE METABOLIC PANEL WITH GFR
AG Ratio: 1.7 (calc) (ref 1.0–2.5)
ALT: 12 U/L (ref 6–29)
AST: 14 U/L (ref 10–35)
Albumin: 4.4 g/dL (ref 3.6–5.1)
Alkaline phosphatase (APISO): 74 U/L (ref 37–153)
BUN: 12 mg/dL (ref 7–25)
CO2: 27 mmol/L (ref 20–32)
Calcium: 9.8 mg/dL (ref 8.6–10.4)
Chloride: 106 mmol/L (ref 98–110)
Creat: 0.96 mg/dL (ref 0.50–1.05)
Globulin: 2.6 g/dL (ref 1.9–3.7)
Glucose, Bld: 104 mg/dL — ABNORMAL HIGH (ref 65–99)
Potassium: 4.5 mmol/L (ref 3.5–5.3)
Sodium: 141 mmol/L (ref 135–146)
Total Bilirubin: 0.4 mg/dL (ref 0.2–1.2)
Total Protein: 7 g/dL (ref 6.1–8.1)
eGFR: 64 mL/min/{1.73_m2} (ref >=60)

## 2023-09-03 LAB — LIPID PANEL
Cholesterol: 215 mg/dL — ABNORMAL HIGH (ref <200)
HDL: 55 mg/dL (ref >=50)
LDL Cholesterol (Calc): 127 mg/dL — ABNORMAL HIGH
Non-HDL Cholesterol (Calc): 160 mg/dL — ABNORMAL HIGH (ref <130)
Total CHOL/HDL Ratio: 3.9 (calc) (ref <5.0)
Triglycerides: 189 mg/dL — ABNORMAL HIGH (ref <150)

## 2023-09-03 LAB — PARATHYROID HORMONE, INTACT (NO CA): PTH: 81 pg/mL — ABNORMAL HIGH (ref 16–77)

## 2023-09-03 LAB — MICROALBUMIN / CREATININE URINE RATIO
Creatinine, Urine: 96 mg/dL (ref 20–275)
Microalb Creat Ratio: 14 mg/g{creat} (ref <30)
Microalb, Ur: 1.3 mg/dL

## 2023-09-11 DIAGNOSIS — I2510 Atherosclerotic heart disease of native coronary artery without angina pectoris: Secondary | ICD-10-CM | POA: Diagnosis not present

## 2023-09-11 DIAGNOSIS — I10 Essential (primary) hypertension: Secondary | ICD-10-CM | POA: Diagnosis not present

## 2023-09-11 DIAGNOSIS — E782 Mixed hyperlipidemia: Secondary | ICD-10-CM | POA: Diagnosis not present

## 2023-09-11 DIAGNOSIS — I255 Ischemic cardiomyopathy: Secondary | ICD-10-CM | POA: Diagnosis not present

## 2023-09-12 ENCOUNTER — Other Ambulatory Visit: Payer: Self-pay | Admitting: Pharmacy Technician

## 2023-09-12 DIAGNOSIS — Z5986 Financial insecurity: Secondary | ICD-10-CM

## 2023-09-12 NOTE — Progress Notes (Signed)
Pharmacy Medication Assistance Program Note    09/12/2023  Patient ID: Kathy Price, female   DOB: [birth year], 67 y.o.   MRN: [id number]     09/12/2023  Outreach Medication One  Manufacturer Medication One [employer]  [employer] Drugs Jardiance  Dose of Jardiance 25mg   Type of Radiographer, therapeutic Assistance  Date Application Sent to Patient 09/11/2023  Application Items Requested Application;Proof of Income;Other  Date Application Sent to Prescriber 09/13/2023  Name of Prescriber Alba Cory          09/12/2023  Outreach Medication Two  Initial Outreach Date (Medication Two) 09/11/2023  Manufacturer Medication Two Novo Nordisk  Nordisk Drugs Ozempic  Dose of Ozempic 4mg /31ml  Type of Audiological scientist Items Requested Application;Proof of Income;Other  Date Application Sent to Prescriber 09/13/2023  Name of Prescriber [facility]       Signature  Pattricia Boss, CPhT [facility]  Office: [phone] Fax: [phone] Email: [email or web address]

## 2023-09-20 ENCOUNTER — Other Ambulatory Visit: Payer: Self-pay | Admitting: Pharmacy Technician

## 2023-09-20 DIAGNOSIS — Z5986 Financial insecurity: Secondary | ICD-10-CM

## 2023-09-20 NOTE — Progress Notes (Signed)
Pharmacy Medication Assistance Program Note    09/20/2023  Patient ID: Kathy Price, female   DOB: 1956-10-05, 67 y.o.   MRN: [id number]     09/12/2023 09/20/2023  Outreach Medication One  Manufacturer Medication One [employer]   [employer] Drugs Jardiance   Dose of Jardiance 25mg    Type of Radiographer, therapeutic Assistance   Date Application Sent to Patient 09/11/2023   Application Items Requested Application;Proof of Income;Other   Date Application Sent to Prescriber 09/13/2023   Name of Prescriber Alba Cory   Date Application Received From Patient  09/17/2023  Application Items Received From Patient  Application;Proof of Income;Other  Date Application Received From Provider  08/30/2023  Date Application Submitted to Manufacturer  09/19/2023  Method Application Sent to Manufacturer  Fax      Still waiting for patient to mail back [employer] application for [name].  Signature Kristopher Glee [facility]  Office: [phone] Fax: [phone] Email: [email or web address]

## 2023-10-10 ENCOUNTER — Other Ambulatory Visit: Payer: Self-pay | Admitting: Pharmacist

## 2023-10-10 NOTE — Patient Instructions (Signed)
 Goals Addressed             This Visit's Progress    Pharmacy Goals       Please watch the mail for an envelope from [facility] containing the patient assistance program application. Please complete this application and mail back to [facility] Pharm[facility] Technician Noreene Larsson Simcox along with a copy of your Medicare Part D prescription card and a copy of your proof of income document OR you can bring these documents to the office to have them faxed back to Attention: Pattricia Boss at Fax # [phone]   If you need to call Noreene Larsson, you can reach her at [phone]   If you need to reach out to patient assistance programs regarding refills or to find out the status of your application, you can do so by calling:   [employer] [phone] Novo Nordisk at [phone]   Thank you!   Estelle Grumbles, PharmD, [facility] [phone]

## 2023-10-10 NOTE — Progress Notes (Signed)
   10/10/2023  Patient ID: Kathy Price, female   DOB: [birth year], 67 y.o.   MRN: [id number]  Receive a message from patient requesting a call back regarding her patient assistance applications.  Return call to patient  Reports that she completed and mailed re-enrollment application for Jardiance patient assistance back to CPhT, but has not yet received application for applying for patient assistance for Ozempic.  From review of chart, note CPhT faxed patient's completed application for Jardiance patient assistance re-enrollment to manufacturer on 09/19/2023.  Note CPhT mailed application for patient assistance for Ozempic to patient in November.   Plan:  Will collaborate with CPhT to ask if she would re-mail application for patient assistance for Ozempic to patient.  Follow Up Plan: Clinical Pharmacist will follow up with patient by telephone on 11/20/2023 at 9:30 AM    Estelle Grumbles, PharmD, [facility] [phone]

## 2023-10-11 ENCOUNTER — Telehealth: Payer: Self-pay | Admitting: Pharmacy Technician

## 2023-10-11 DIAGNOSIS — Z5986 Financial insecurity: Secondary | ICD-10-CM

## 2023-10-11 NOTE — Progress Notes (Signed)
Pharmacy Medication Assistance Program Note    10/11/2023  Patient ID: Kathy Price, female  DOB: 1956/04/30, 67 y.o.  MRN:  [id number]     09/12/2023 10/11/2023  Outreach Medication Two  Initial Outreach Date (Medication Two) 09/11/2023   Manufacturer Medication Two Novo Nordisk   Nordisk Drugs Ozempic   Dose of Ozempic 4mg /14ml   Type of Pensions consultant Items Requested Application;Proof of Income;Other   Date Application Sent to Prescriber 09/13/2023   Name of Prescriber Alba Cory   Date Application Received From Patient  10/10/2023  Application Items Received From Patient  Application;Proof of Income;Other  Date Application Received From Provider  09/18/2023  Method Application Sent to Manufacturer  Fax  Date Application Submitted to Manufacturer  10/11/2023     Signature  Kristopher Glee [place]  Office: [phone] Fax: [phone] Email: [email or web address]

## 2023-10-31 DIAGNOSIS — M1711 Unilateral primary osteoarthritis, right knee: Secondary | ICD-10-CM | POA: Diagnosis not present

## 2023-11-08 ENCOUNTER — Telehealth: Payer: Self-pay | Admitting: Pharmacy Technician

## 2023-11-08 DIAGNOSIS — Z5986 Financial insecurity: Secondary | ICD-10-CM

## 2023-11-08 NOTE — Progress Notes (Signed)
 Pharmacy Medication Assistance Program Note    11/08/2023  Patient ID: Kathy Price, female  DOB: November 02, 1955, 68 y.o.  MRN:  [id number]     09/12/2023 10/11/2023 11/08/2023  Outreach Medication Two  Initial Outreach Date (Medication Two) 09/11/2023    Manufacturer Medication Two Novo Nordisk    Nordisk Drugs Ozempic     Dose of Ozempic  4mg /50ml    Type of Gaffer Items Requested Application;Proof of Income;Other    Date Application Sent to Prescriber 09/13/2023    Name of Prescriber Dorette Loron    Date Application Received From Patient  10/10/2023   Application Items Received From Patient  Application;Proof of Income;Other   Date Application Received From Provider  09/18/2023   Method Application Sent to Manufacturer  Fax   Date Application Submitted to Manufacturer  10/11/2023   Patient Assistance Determination   Approved  Approval Start Date   10/30/2023  Additional Outreach Contact   Provider  Contacted Provider   Message   Two care coordination call placed to [employer] in regard to Ozempic  application and to BI in regard to Jardiance  application.  Per Lashonda at [employer], patient is APPROVED 10/30/23-10/28/24 for Ozempic .  Medication will auto fill and ship to prescriber's office. Patient may call [employer] at any time to check on next shipment by calling [phone].  Per Tamya at [facility], they have not received any new applications. The application was submitted on 09/19/23 with a successful fax confirmation. She informs to re fax it to [facility] at [phone].  Refaxed application to BI will f/u.  Signature  Kate Marzette Sola [facility]  Office: [phone] Fax: [phone] Email: [email or web address]

## 2023-11-15 ENCOUNTER — Telehealth: Payer: Self-pay | Admitting: Pharmacy Technician

## 2023-11-15 ENCOUNTER — Telehealth: Payer: Self-pay | Admitting: Family Medicine

## 2023-11-15 DIAGNOSIS — Z5986 Financial insecurity: Secondary | ICD-10-CM

## 2023-11-15 NOTE — Progress Notes (Signed)
Pharmacy Medication Assistance Program Note    11/15/2023  Patient ID: Kathy Price, female   DOB: 20-Aug-1956, 68 y.o.   MRN: [id number]     09/12/2023 09/20/2023 11/15/2023  Outreach Medication One  Manufacturer Medication One [employer]    [employer] Drugs Jardiance    Dose of Jardiance 25mg     Type of Radiographer, therapeutic Assistance    Date Application Sent to Patient 09/11/2023    Application Items Requested Application;Proof of Income;Other    Date Application Sent to Prescriber 09/13/2023    Name of Prescriber Alba Cory    Date Application Received From Patient  09/17/2023   Application Items Received From Patient  Application;Proof of Income;Other   Date Application Received From Provider  08/30/2023   Date Application Submitted to Manufacturer  09/19/2023   Method Application Sent to Manufacturer  Fax   Patient Assistance Determination   Approved  Approval Start Date   11/12/2023  Approval End Date   10/28/2024  Patient Notification Method   Telephone Call  Telephone Call Outcome   Successful    Care coordination call placed to BI in regard to Jardiance.   Spoke to [place] who informs patient is APPROVED 11/12/2023-10/28/2024. He informs prescription was sent over to pharmacy today for processing and should be delivered to the patient's home in the next 10 Business Days. Patient will need to call BI for refills by dialing [phone].  Successful outreach to patient. Notified patient of her approval, when to expect medication and how to obtain refills.  Will send in basket to [facility] PharmD as well as FYI.  Pattricia Boss, CPhT [place]  Office: [phone] Fax: [phone] Email: [email or web address]

## 2023-11-15 NOTE — Telephone Encounter (Signed)
Pt has dropped off forms for Jardiance and I just faxed them to [place] Simcox at [phone] with a confirmation sheet.

## 2023-11-19 ENCOUNTER — Other Ambulatory Visit: Payer: Self-pay | Admitting: Pharmacist

## 2023-11-19 ENCOUNTER — Encounter: Payer: Self-pay | Admitting: Pharmacist

## 2023-11-19 DIAGNOSIS — I420 Dilated cardiomyopathy: Secondary | ICD-10-CM

## 2023-11-19 NOTE — Progress Notes (Signed)
   11/19/2023  Patient ID: Kathy Price, female   DOB: December 03, 1955, 68 y.o.   MRN: [id number]  Receive a message from patient requesting a call back to reschedule our appointment from 11/20/2023. Return call to patient.  Per messages from CPhT Pattricia Boss, patient is approved for enrollment in [employer] patient assistance for Ozempic through 10/28/2024 and re-enrollment in BI patient assistance for Jardiance through 10/28/2024.  Today patient shares that she received a shipment of Jardiance in the mail from assistance program today.  Reports that she contacted office today regarding her Ozempic, but office had not yet received this for her. Shares that she used up her last dose of Ozempic 0.5 mg last Saturday, 11/16/2023.  Outreach to [employer] patient assistance program today on behalf of patient. Speak with representative who advises patient's prescription is preparing to ship to office, but has not shipped as of today. Advise patient that if her Ozempic does not arrive by 11/21/2023, she may contact [employer] at [phone]  to let them know that it did not arrive within the 14 business days of approval and request a voucher to use to pick up a free 30 day supply from her local pharmacy.  - Follow up with patient to provide this update  Follow Up Plan: Clinical Pharmacist will follow up with patient by telephone on 12/18/2023 at 1:30 PM   Estelle Grumbles, PharmD, [facility] [phone]

## 2023-11-19 NOTE — Patient Instructions (Signed)
Goals Addressed             This Visit's Progress    Pharmacy Goals       If you need to reach out to patient assistance programs regarding refills or to find out the status of your application, you can do so by calling:   [employer] [phone] Novo Nordisk at [phone]   Thank you!    Estelle Grumbles, PharmD, [facility] [phone]

## 2023-11-20 ENCOUNTER — Other Ambulatory Visit: Payer: Self-pay | Admitting: Pharmacist

## 2023-11-25 ENCOUNTER — Telehealth: Payer: Self-pay | Admitting: Family Medicine

## 2023-11-25 NOTE — Telephone Encounter (Signed)
Copied from CRM [id number]. Topic: General - Other >> Nov 25, 2023 12:19 PM [place] B wrote: Reason for CRM: pt called in says hasn't received Ozempic yet. She tried to call pharmacy, but couldn't get thru. Can pt be snet a 30 days supply of this until the regular refill comes in?

## 2023-11-25 NOTE — Telephone Encounter (Signed)
Advised pt she will need to contact [employer] program and verify if they have shipped her medicine and let us know.

## 2023-12-02 ENCOUNTER — Other Ambulatory Visit: Payer: Self-pay | Admitting: Family Medicine

## 2023-12-02 ENCOUNTER — Telehealth: Payer: Self-pay | Admitting: Family Medicine

## 2023-12-02 DIAGNOSIS — E1129 Type 2 diabetes mellitus with other diabetic kidney complication: Secondary | ICD-10-CM

## 2023-12-02 MED ORDER — OZEMPIC (0.25 OR 0.5 MG/DOSE) 2 MG/3ML ~~LOC~~ SOPN
0.2500 mg | PEN_INJECTOR | SUBCUTANEOUS | 0 refills | Status: DC
Start: 2023-12-02 — End: 2023-12-31

## 2023-12-02 NOTE — Telephone Encounter (Signed)
Patient planning to [employer] today to request a voucher for free 1 month supply of Ozempic as her supply from the assistance program has not yet arrived within 14 business days of approval in program and she is out of the medication

## 2023-12-02 NOTE — Telephone Encounter (Signed)
 Pt.notified

## 2023-12-02 NOTE — Telephone Encounter (Signed)
Pt is calling in because she received a voucher for Ozempic through an assistance program and she [facility] , pt is requesting a call when it has been done.

## 2023-12-18 ENCOUNTER — Other Ambulatory Visit: Payer: Self-pay | Admitting: Pharmacist

## 2023-12-18 DIAGNOSIS — I255 Ischemic cardiomyopathy: Secondary | ICD-10-CM

## 2023-12-18 NOTE — Patient Instructions (Signed)
 Goals Addressed             This Visit's Progress    Pharmacy Goals       If you need to reach out to patient assistance programs regarding refills or to find out the status of your application, you can do so by calling:   [employer] [phone] [employer] at [phone]   Thank you!    Estelle Grumbles, PharmD, [facility] [phone]

## 2023-12-18 NOTE — Progress Notes (Signed)
 12/18/2023 Name: Kathy Price MRN: [id number] DOB: October 16, 1956  Chief Complaint  Patient presents with   Medication Assistance   Medication Management    Kathy Price is a 68 y.o. year old female who presented for a telephone visit.   They were referred to the pharmacist by their PCP for assistance in managing medication access.    Subjective:  Care Team: Primary Care Provider: Alba Cory, MD ; Next Scheduled Visit: 12/31/2023 Cardiologist: Olena Heckle, MD; Next Scheduled Visit: 06/10/2024  Medication Access/Adherence  [facility] [address] [place] - [address] [place] [address] Phone: [phone] Fax: [phone]  [facility] - [place], [place] - [address] [address] [address] [place] [address] Phone: [phone] Fax: [phone]   Patient reports affordability concerns with their medications: No  Patient reports access/transportation concerns to their pharmacy: No  Patient reports adherence concerns with their medications:  No     Diabetes:  Current medications:  - Jardiance 25 mg daily - Ozempic 0.5 mg weekly  Denies checking home blood sugar recently  Patient denies hypoglycemic s/sx including dizziness, shakiness, sweating.   Current physical activity: stationary bike 30 minutes x 3 days/week  Statin therapy: atorvastatin 40 mg daily  Current medication access support:  - enrolled in patient assistance for Jardiance from [employer] through 10/28/2024 - enrolled in patient assistance for Ozempic from [employer] through 10/28/2024    Heart Failure:   Current medications:  ACEi/ARB/ARNI: Entresto 24-26 mg twice daily SGLT2i: Jardiance 25 mg daily Beta blocker: carvedilol 3.125 mg twice daily Mineralocorticoid Receptor Antagonist: spironolactone 25 mg - 1/2 tablet daily   Current home blood pressure readings: reports typically ~120/79   Patient denies signs of  swelling    Denies symptoms of hypotension such as dizziness or lightheadedness    Current medication access support:  - enrolled in patient assistance for Jardiance from [employer] through 10/28/2024 - enrolled in patient assistance for Entresto. Reports enrollment for Entresto assistance managed by Cardiology   Objective:  Lab Results  Component Value Date   HGBA1C 6.9 (A) 09/02/2023    Lab Results  Component Value Date   CREATININE 0.96 09/02/2023   BUN 12 09/02/2023   NA 141 09/02/2023   K 4.5 09/02/2023   CL 106 09/02/2023   CO2 27 09/02/2023    Lab Results  Component Value Date   CHOL 215 (H) 09/02/2023   HDL 55 09/02/2023   LDLCALC 127 (H) 09/02/2023   TRIG 189 (H) 09/02/2023   CHOLHDL 3.9 09/02/2023   BP Readings from Last 3 Encounters:  09/02/23 124/68  04/18/23 126/74  02/14/23 126/72   Pulse Readings from Last 3 Encounters:  09/02/23 86  02/14/23 90  01/02/23 89     Medications Reviewed Today     Reviewed by Manuela Neptune, RPH-CPP (Pharmacist) on 12/18/23 at 1347  Med List Status: <None>   Medication Order Taking? Sig Documenting Provider Last Dose Status Informant  acetaminophen (TYLENOL) 500 MG tablet [id number]  Take 1,000 mg by mouth every 6 (six) hours as needed for moderate pain. [provider]  Active Self  aspirin EC 81 MG tablet [id number]  Take 1 tablet by mouth daily. [provider]  Active Self           Med Note Doylene Canard, JENNIFER L   Tue May 15, 2016 10:41 AM)    atorvastatin (LIPITOR) 40 MG tablet [id number]  Take  1 tablet (40 mg total) by mouth daily. Alba Cory, MD  Active   carvedilol (COREG) 3.125 MG tablet [id number] Yes Take 1 tablet (3.125 mg total) by mouth 2 (two) times daily with a meal. Alba Cory, MD Taking Active   Cholecalciferol (VITAMIN D3) 2000 UNITS capsule [id number]  Take 1 capsule by mouth daily. [provider]  Active Self           Med Note Doylene Canard, JENNIFER L   Tue May 15, 2016  10:41 AM)    empagliflozin (JARDIANCE) 25 MG TABS tablet [id number] Yes Take 1 tablet (25 mg total) by mouth daily. Alba Cory, MD Taking Active Self  Multiple Vitamins-Minerals (WOMENS MULTIVITAMIN PO) [id number]  Take 1 tablet by mouth.  [provider]  Active Self  sacubitril-valsartan (ENTRESTO) 24-26 MG [id number] Yes Take 1 tablet by mouth 2 (two) times daily. Deirdre Evener, MD Taking Active Self  Semaglutide, 1 MG/DOSE, (OZEMPIC, 1 MG/DOSE,) 4 MG/3ML SOPN [id number]  Inject 1 mg into the skin once a week. Alba Cory, MD  Active   Semaglutide,0.25 or 0.5MG /DOS, (OZEMPIC, 0.25 OR 0.5 MG/DOSE,) 2 MG/3ML SOPN [id number] Yes Inject 0.25-0.5 mg into the skin once a week.  Patient taking differently: Inject 0.5 mg into the skin once a week.   Alba Cory, MD Taking Active   spironolactone (ALDACTONE) 25 MG tablet [id number] Yes Take 12.5 mg by mouth daily. Lamar Blinks, MD Taking Active Self              Assessment/Plan:   Patient to follow up with [facility] patient assistance as needed for refills of Jardiance and [employer] as needed for refills of Ozempic  Diabetes: - Currently controlled - Reviewed goal A1c, goal fasting, and goal 2 hour post prandial glucose - Reviewed dietary modifications including importance of having regular well-balanced meals and snacks throughout the day, while controlling carbohydrate portion sizes - Recommend to check glucose, keep log of results and have this record to review at upcoming medical appointments. Patient to contact provider office sooner if needed for readings outside of established parameters or symptoms  Heart Failure: - Recommend to monitor home blood pressure and home weight, keep log of results and have this record to review at upcoming medical appointments. Patient to contact provider office sooner if needed for readings outside of established parameters or symptoms - Will collaborate with provider, CPhT to  aid patient with re-enrollment in patient assistance program for Jardiance from [facility] for 2025 calendar year     Follow Up Plan: Clinical Pharmacist will follow up with patient by telephone on 02/05/2024 at 1:00 PM    Estelle Grumbles, PharmD, [facility] [phone]

## 2023-12-27 ENCOUNTER — Other Ambulatory Visit: Payer: Self-pay | Admitting: Family Medicine

## 2023-12-27 DIAGNOSIS — E1129 Type 2 diabetes mellitus with other diabetic kidney complication: Secondary | ICD-10-CM

## 2023-12-31 ENCOUNTER — Ambulatory Visit: Payer: Medicare Other | Admitting: Family Medicine

## 2023-12-31 ENCOUNTER — Encounter: Payer: Self-pay | Admitting: Family Medicine

## 2023-12-31 VITALS — BP 122/80 | HR 87 | Temp 98.8°F | Resp 16 | Ht 62.0 in | Wt 225.6 lb

## 2023-12-31 DIAGNOSIS — E1129 Type 2 diabetes mellitus with other diabetic kidney complication: Secondary | ICD-10-CM | POA: Diagnosis not present

## 2023-12-31 DIAGNOSIS — I2510 Atherosclerotic heart disease of native coronary artery without angina pectoris: Secondary | ICD-10-CM

## 2023-12-31 DIAGNOSIS — R809 Proteinuria, unspecified: Secondary | ICD-10-CM | POA: Diagnosis not present

## 2023-12-31 DIAGNOSIS — E1169 Type 2 diabetes mellitus with other specified complication: Secondary | ICD-10-CM

## 2023-12-31 DIAGNOSIS — I255 Ischemic cardiomyopathy: Secondary | ICD-10-CM | POA: Diagnosis not present

## 2023-12-31 DIAGNOSIS — I420 Dilated cardiomyopathy: Secondary | ICD-10-CM

## 2023-12-31 DIAGNOSIS — E559 Vitamin D deficiency, unspecified: Secondary | ICD-10-CM

## 2023-12-31 DIAGNOSIS — E213 Hyperparathyroidism, unspecified: Secondary | ICD-10-CM

## 2023-12-31 DIAGNOSIS — E785 Hyperlipidemia, unspecified: Secondary | ICD-10-CM

## 2023-12-31 DIAGNOSIS — Z9861 Coronary angioplasty status: Secondary | ICD-10-CM

## 2023-12-31 LAB — POCT GLYCOSYLATED HEMOGLOBIN (HGB A1C): Hemoglobin A1C: 6.3 % — AB (ref 4.0–5.6)

## 2023-12-31 MED ORDER — ATORVASTATIN CALCIUM 80 MG PO TABS: 80.0000 mg | ORAL_TABLET | Freq: Every day | ORAL | 1 refills | Status: DC

## 2023-12-31 NOTE — Progress Notes (Signed)
 Name: Kathy Price   MRN: [id number]    DOB: Jan 07, 1956   Date:12/31/2023       Progress Note  Subjective  Chief Complaint  Chief Complaint  Patient presents with   Medical Management of Chronic Issues   HPI   DMII: A1C was 6.2% six months ago, however she has been eating more carbohydrates and gaining weight. Today A1C is up to 6.9 %  . She denies polyphagia, polydipsia or polyuria. Urine micro was elevated was 104 but normalized,   she is on ARB ( on the form of Entresto) and also on Jardiance.  She has associated obesity, dyslipidemia, HTN. 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 Stress test only showed old lesion, no acute ischemia She has been taking apirin 81 mg daily, Entresto, spironolactone , carvedilol , Jardiance and atorvastatin, LDL not at goal, we will adjust dose to 80 mg of Atorvastatin today.. She denies diaphoresis, orthopnea or edema, denies decrease in exercise tolerance    INTERPRETATION 03/2022 MILD LV SYSTOLIC DYSFUNCTION (See above)   WITH MILD LVH  NORMAL RIGHT VENTRICULAR SYSTOLIC FUNCTION  TRIVIAL REGURGITATION NOTED (See above)  NO VALVULAR STENOSIS  TRIVIAL TR, MR, PR  EF 40%    GERD: she had EGD done by Dr. Tobi Bastos back 10/2016 and was treated for h. Pylori gastritis. She occasionally has heartburn    Morbid Obesity: She is losing weight since last visit, down almost 14 lbs since Nov 2024. She is taking GLP-1 agonist, being mindful about her diet    OA knee: she is under the care of Ortho and gets steroid injections prn, she is off NSAID's, she is taking  tylenol only or topical medications for knee pain   Hyperlipidemia: LDL was above goal, taking Atorvastatin 40 mg daily , last LDL was not at goal. Discussed importance of getting LDL to goal . 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, but since level not very high and normal calcium we will continue to monitor, not ready to go back to Endo at this time  Patient Active Problem List   Diagnosis Date Noted   History of parathyroidectomy 01/02/2023   Primary osteoarthritis of both knees 08/15/2022   Helicobacter pylori gastritis 11/06/2016   Ischemic dilated cardiomyopathy (HCC) 01/30/2016   CAD S/P percutaneous coronary angioplasty 09/07/2015   Dyslipidemia associated with type 2 diabetes mellitus (HCC) 09/07/2015   Dyslipidemia 09/07/2015   H/O: hysterectomy 09/07/2015   H/O acute myocardial infarction 09/07/2015   Vitamin D deficiency 09/07/2015   Morbid obesity (HCC) 09/07/2015   Bradycardia 05/13/2014   Arteriosclerosis of coronary artery 05/13/2014   Essential (primary) hypertension 05/13/2014   UARS (upper airway resistance syndrome) 01/23/2013    Past Surgical History:  Procedure Laterality Date   ABDOMINAL HYSTERECTOMY     COLONOSCOPY     COLONOSCOPY WITH PROPOFOL N/A 05/09/2020   Procedure: COLONOSCOPY WITH PROPOFOL;  Surgeon: Wyline Mood, MD;  Location: [facility] ENDOSCOPY;  Service: Gastroenterology;  Laterality: N/A;   CORONARY ANGIOPLASTY  2002   stents   ESOPHAGOGASTRODUODENOSCOPY (EGD) WITH PROPOFOL N/A 11/05/2016   Procedure: ESOPHAGOGASTRODUODENOSCOPY (EGD) WITH  PROPOFOL;  Surgeon: Wyline Mood, MD;  Location: [facility] ENDOSCOPY;  Service: Endoscopy;  Laterality: N/A;   PARATHYROIDECTOMY N/A 01/01/2023   Procedure: LEFT SUPERIOR AND INFERIOR PARATHYROIDECTOMY;  Surgeon: Kinsinger, De Blanch, MD;  Location: [facility] ORS;  Service: General;  Laterality: N/A;    Family History  Problem Relation Age of Onset   Diabetes Mother    Hypertension Mother    Cancer Father    CAD Brother    Breast cancer Sister 67    Social  History   Tobacco Use   Smoking status: Former    Current packs/day: 0.00    Average packs/day: 1 pack/day for 25.0 years (25.0 ttl pk-yrs)    Types: Cigarettes    Start date: 09/07/1975    Quit date: 09/06/2000    Years since quitting: 23.3    Passive exposure: Current (husband smokes inside)   Smokeless tobacco: Never   Tobacco comments:    Smoking cessation materials not required  Substance Use Topics   Alcohol use: No    Alcohol/week: 0.0 standard drinks of alcohol     Current Outpatient Medications:    acetaminophen (TYLENOL) 500 MG tablet, Take 1,000 mg by mouth every 6 (six) hours as needed for moderate pain., Disp: , Rfl:    aspirin EC 81 MG tablet, Take 1 tablet by mouth daily., Disp: , Rfl:    atorvastatin (LIPITOR) 40 MG tablet, Take 1 tablet (40 mg total) by mouth daily., Disp: 90 tablet, Rfl: 1   carvedilol (COREG) 3.125 MG tablet, Take 1 tablet (3.125 mg total) by mouth 2 (two) times daily with a meal., Disp: 180 tablet, Rfl: 1   Cholecalciferol (VITAMIN D3) 2000 UNITS capsule, Take 1 capsule by mouth daily., Disp: , Rfl:    empagliflozin (JARDIANCE) 25 MG TABS tablet, Take 1 tablet (25 mg total) by mouth daily., Disp: 90 tablet, Rfl: 3   Multiple Vitamins-Minerals (WOMENS MULTIVITAMIN PO), Take 1 tablet by mouth. , Disp: , Rfl:    sacubitril-valsartan (ENTRESTO) 24-26 MG, Take 1 tablet by mouth 2 (two) times daily., Disp: , Rfl:    Semaglutide, 1 MG/DOSE, (OZEMPIC, 1 MG/DOSE,) 4 MG/3ML SOPN, Inject 1 mg into the skin once a week., Disp: 9 mL, Rfl: 3   Semaglutide,0.25 or 0.5MG /DOS, (OZEMPIC, 0.25 OR 0.5 MG/DOSE,) 2 MG/3ML SOPN, Inject 0.25-0.5 mg into the skin once a week. (Patient taking differently: Inject 0.5 mg into the skin once a week.), Disp: 3 mL, Rfl: 0   spironolactone (ALDACTONE) 25 MG tablet, Take 12.5 mg by mouth daily., Disp: , Rfl:   Allergies  Allergen Reactions   Metoprolol Other (See Comments)    severe bradycardia   Pantoprazole Rash   Sulfa  Antibiotics Rash and Hives    I personally reviewed active problem list, medication list, allergies, family history with the patient/caregiver today.   ROS  Ten systems reviewed and is negative except as mentioned in HPI    Objective  Vitals:   12/31/23 0941  BP: 122/80  Pulse: 87  Resp: 16  Temp: 98.8 F (37.1 C)  TempSrc: Oral  SpO2: 99%  Weight: 225 lb 9.6 oz (102.3 kg)  Height: 5\' 2"  (1.575 m)    Body mass index is 41.26 kg/m.  Physical Exam  Constitutional: Patient appears well-developed and well-nourished. Obese  No distress.  HEENT: head atraumatic, normocephalic, pupils equal and reactive to light, neck supple Cardiovascular: Normal rate, regular rhythm and normal heart sounds.  No murmur heard. No BLE edema. Pulmonary/Chest: Effort normal  and breath sounds normal. No respiratory distress. Abdominal: Soft.  There is no tenderness. Psychiatric: Patient has a normal mood and affect. behavior is normal. Judgment and thought content normal.   Recent Results (from the past 2160 hours)  POCT glycosylated hemoglobin (Hb A1C)     Status: Abnormal   Collection Time: 12/31/23  9:50 AM  Result Value Ref Range   Hemoglobin A1C 6.3 (A) 4.0 - 5.6 %   HbA1c POC (<> result, manual entry)     HbA1c, POC (prediabetic range)     HbA1c, POC (controlled diabetic range)      Diabetic Foot Exam:     PHQ2/9:    12/31/2023    9:41 AM 09/02/2023    9:06 AM 04/18/2023    8:16 AM 02/14/2023    8:03 AM 10/25/2022   12:15 PM  Depression screen PHQ 2/9  Decreased Interest 0 0 0 0 0  Down, Depressed, Hopeless 0 0 0 0 0  PHQ - 2 Score 0 0 0 0 0  Altered sleeping 0 0  0 0  Tired, decreased energy 0 0  0 0  Change in appetite 0 0  0 0  Feeling bad or failure about yourself  0 0  0 0  Trouble concentrating 0 0  0 0  Moving slowly or fidgety/restless 0 0  0 0  Suicidal thoughts 0 0  0 0  PHQ-9 Score 0 0  0 0  Difficult doing work/chores Not difficult at all        phq 9 is  negative  Fall Risk:    12/31/2023    9:40 AM 09/02/2023    9:06 AM 04/18/2023    8:13 AM 02/14/2023    8:02 AM 10/25/2022   12:15 PM  Fall Risk   Falls in the past year? 0 0 0 0 0  Number falls in past yr: 0 0 0 0 0  Injury with Fall? 0 0 0 0 0  Risk for fall due to : No Fall Risks No Fall Risks  No Fall Risks No Fall Risks  Follow up Falls prevention discussed;Education provided;Falls evaluation completed Falls prevention discussed Education provided;Falls prevention discussed Falls prevention discussed Falls prevention discussed     Assessment & Plan  1. Controlled type 2 diabetes mellitus with microalbuminuria, without long-term current use of insulin (HCC) (Primary)  - POCT glycosylated hemoglobin (Hb A1C)  2. Dyslipidemia associated with type 2 diabetes mellitus (HCC)  - atorvastatin (LIPITOR) 80 MG tablet; Take 1 tablet (80 mg total) by mouth daily.  Dispense: 90 tablet; Refill: 1  3. Morbid obesity (HCC)  Discussed with the patient the risk posed by an increased BMI. Discussed importance of portion control, calorie counting and at least 150 minutes of physical activity weekly. Avoid sweet beverages and drink more water. Eat at least 6 servings of fruit and vegetables daily    4. Ischemic dilated cardiomyopathy (HCC)  Doing well   5. Hyperparathyroidism (HCC)  We will recheck it yearly   6. CAD S/P percutaneous coronary angioplasty  No angina symptoms at this time  7. Vitamin D deficiency  On supplementation

## 2024-01-15 ENCOUNTER — Other Ambulatory Visit: Payer: Self-pay | Admitting: Family Medicine

## 2024-01-15 ENCOUNTER — Telehealth: Payer: Self-pay | Admitting: Pharmacist

## 2024-01-15 MED ORDER — SEMAGLUTIDE (1 MG/DOSE) 4 MG/3ML ~~LOC~~ SOPN: 1.0000 mg | PEN_INJECTOR | SUBCUTANEOUS | 0 refills | Status: DC

## 2024-01-15 NOTE — Progress Notes (Signed)
 Receive call from patient. She received a voucher from [employer] to pick up 1 month supply of her Ozempic from [facility]. Requests prescription [facility].  Kathy Price, PharmD, [facility] [phone]

## 2024-02-05 ENCOUNTER — Other Ambulatory Visit: Payer: Medicare Other | Admitting: Pharmacist

## 2024-02-05 ENCOUNTER — Encounter: Payer: Self-pay | Admitting: Pharmacist

## 2024-02-05 DIAGNOSIS — I10 Essential (primary) hypertension: Secondary | ICD-10-CM

## 2024-02-05 MED ORDER — ACCU-CHEK GUIDE ME W/DEVICE KIT: PACK | 0 refills | Status: DC

## 2024-02-05 MED ORDER — ACCU-CHEK SOFTCLIX LANCETS MISC: 3 refills | Status: AC

## 2024-02-05 MED ORDER — ACCU-CHEK GUIDE TEST VI STRP: ORAL_STRIP | 3 refills | Status: AC

## 2024-02-05 NOTE — Progress Notes (Signed)
 02/05/2024 Name: Kathy Price MRN: [id number] DOB: [birth year]  Chief Complaint  Patient presents with   Medication Assistance   Medication Management    Kathy Price is a 68 y.o. year old female who presented for a telephone visit.   They were referred to the pharmacist by their PCP for assistance in managing medication access.      Subjective:   Care Team: Primary Care Provider: Alba Cory, MD ; Next Scheduled Visit: 05/07/2024 Cardiologist: Olena Heckle, MD; Next Scheduled Visit: 06/10/2024  Medication Access/Adherence  [facility]  [facility] [address] [place] - [address] [place] [address] Phone: [phone] Fax: [phone]  [facility] - [place], [place] - [address] [place] [address] Phone: [phone] Fax: [phone]   Patient reports affordability concerns with their medications: No  Patient reports access/transportation concerns to their pharmacy: No  Patient reports adherence concerns with their medications:  No     Diabetes:   Current medications:  - Jardiance 25 mg daily - Ozempic 1 mg weekly  Reports tolerating well   Denies checking home blood sugar. Request to have home blood sugar monitor  Reports has noticed appetite control benefit and notes that she has lost weight since increased dose of Ozempic. Recent home weight ~219 lbs   Patient denies hypoglycemic s/sx including dizziness, shakiness, sweating.    Current physical activity: stationary bike 30 minutes x 3 days/week   Statin therapy: atorvastatin 80 mg daily   Current medication access support:  - enrolled in patient assistance for Jardiance from [employer] through 10/28/2024 - enrolled in patient assistance for Ozempic from [employer] through 10/28/2024     Heart Failure:   Current medications:  ACEi/ARB/ARNI: Entresto 24-26 mg twice daily SGLT2i: Jardiance 25 mg daily Beta  blocker: carvedilol 3.125 mg twice daily Mineralocorticoid Receptor Antagonist: spironolactone 25 mg - 1/2 tablet daily   Denies checking home blood pressure recently   Patient denies signs of swelling    Denies symptoms of hypotension such as dizziness or lightheadedness    Current medication access support:  - enrolled in patient assistance for Jardiance from [employer] through 10/28/2024 - enrolled in patient assistance for Entresto. Reports enrollment for Entresto assistance managed by Cardiology   Objective:  Lab Results  Component Value Date   HGBA1C 6.3 (A) 12/31/2023    Lab Results  Component Value Date   CREATININE 0.96 09/02/2023   BUN 12 09/02/2023   NA 141 09/02/2023   K 4.5 09/02/2023   CL 106 09/02/2023   CO2 27 09/02/2023    Lab Results  Component Value Date   CHOL 215 (H) 09/02/2023   HDL 55 09/02/2023   LDLCALC 127 (H) 09/02/2023   TRIG 189 (H) 09/02/2023   CHOLHDL 3.9 09/02/2023   BP Readings from Last 3 Encounters:  12/31/23 122/80  09/02/23 124/68  04/18/23 126/74   Pulse Readings from Last 3 Encounters:  12/31/23 87  09/02/23 86  02/14/23 90     Medications Reviewed Today     Reviewed by Manuela Neptune, RPH-CPP (Pharmacist) on 02/05/24 at 1316  Med List Status: <None>   Medication Order Taking? Sig Documenting Provider Last Dose Status Informant  acetaminophen (TYLENOL) 500 MG tablet [id number]  Take 1,000 mg by mouth every 6 (six) hours as needed for moderate pain. [provider]  Active Self  aspirin EC 81 MG tablet [id number]  Take 1 tablet by  mouth daily. [provider]  Active Self           Med Note Doylene Canard, JENNIFER L   Tue May 15, 2016 10:41 AM)    atorvastatin (LIPITOR) 80 MG tablet [id number]  Take 1 tablet (80 mg total) by mouth daily. Alba Cory, MD  Active   carvedilol (COREG) 3.125 MG tablet [id number] Yes Take 1 tablet (3.125 mg total) by mouth 2 (two) times daily with a meal. Alba Cory, MD Taking  Active   Cholecalciferol (VITAMIN D3) 2000 UNITS capsule [id number]  Take 1 capsule by mouth daily. [provider]  Active Self           Med Note Doylene Canard, JENNIFER L   Tue May 15, 2016 10:41 AM)    empagliflozin (JARDIANCE) 25 MG TABS tablet [id number] Yes Take 1 tablet (25 mg total) by mouth daily. Alba Cory, MD Taking Active Self  Multiple Vitamins-Minerals (WOMENS MULTIVITAMIN PO) [id number]  Take 1 tablet by mouth.  [provider]  Active Self  sacubitril-valsartan (ENTRESTO) 24-26 MG [id number]  Take 1 tablet by mouth 2 (two) times daily. Deirdre Evener, MD  Active Self  Semaglutide, 1 MG/DOSE, (OZEMPIC, 1 MG/DOSE,) 4 MG/3ML SOPN [id number] Yes Inject 1 mg into the skin once a week. Alba Cory, MD Taking Active   spironolactone (ALDACTONE) 25 MG tablet [id number]  Take 12.5 mg by mouth daily. Lamar Blinks, MD  Active Self              Assessment/Plan:   Patient to follow up with [facility] patient assistance as needed for refills of Jardiance and [employer] as needed for refills of Ozempic   Diabetes: - Currently controlled - Reviewed goal A1c, goal fasting, and goal 2 hour post prandial glucose - Reviewed dietary modifications including importance of having regular well-balanced meals and snacks throughout the day, while controlling carbohydrate portion sizes - Send prescription for glucometer and testing supplies to pharmacy for patient as requested per protocol - Patient to check glucose, keep log of results and have this record to review at upcoming medical appointments. Patient to contact provider office sooner if needed for readings outside of established parameters or symptoms   Heart Failure: - Recommend to monitor home blood pressure and home weight, keep log of results and have this record to review at upcoming medical appointments. Patient to contact provider office sooner if needed for readings outside of established parameters or  symptoms      Follow Up Plan: Clinical Pharmacist will follow up with patient by telephone on 08/19/2024 at 11:00 AM     Estelle Grumbles, PharmD, [facility] [phone]

## 2024-02-05 NOTE — Patient Instructions (Signed)
 Goals Addressed             This Visit's Progress    Pharmacy Goals       If you need to reach out to patient assistance programs regarding refills or to find out the status of your application, you can do so by calling:   [employer] [phone] [employer] at [phone]   Thank you!    Estelle Grumbles, PharmD, [facility] [phone]

## 2024-02-10 ENCOUNTER — Other Ambulatory Visit: Payer: Self-pay | Admitting: Family Medicine

## 2024-02-10 DIAGNOSIS — E1169 Type 2 diabetes mellitus with other specified complication: Secondary | ICD-10-CM

## 2024-02-10 DIAGNOSIS — R809 Proteinuria, unspecified: Secondary | ICD-10-CM

## 2024-02-10 NOTE — Telephone Encounter (Signed)
Previous rx printed.

## 2024-03-05 LAB — HM DIABETES EYE EXAM

## 2024-03-18 ENCOUNTER — Other Ambulatory Visit: Payer: Self-pay | Admitting: Family Medicine

## 2024-04-10 ENCOUNTER — Other Ambulatory Visit: Payer: Self-pay | Admitting: Family Medicine

## 2024-04-10 DIAGNOSIS — Z1231 Encounter for screening mammogram for malignant neoplasm of breast: Secondary | ICD-10-CM

## 2024-04-13 ENCOUNTER — Other Ambulatory Visit: Payer: Self-pay | Admitting: Internal Medicine

## 2024-04-13 DIAGNOSIS — E1129 Type 2 diabetes mellitus with other diabetic kidney complication: Secondary | ICD-10-CM

## 2024-04-13 DIAGNOSIS — E1169 Type 2 diabetes mellitus with other specified complication: Secondary | ICD-10-CM

## 2024-04-15 NOTE — Telephone Encounter (Signed)
 Requested Prescriptions  Pending Prescriptions Disp Refills   Semaglutide , 1 MG/DOSE, (OZEMPIC , 1 MG/DOSE,) 4 MG/3ML SOPN [Pharmacy Med Name: OZEMPIC  4 MG/3 ML (1 MG/DOSE)] 6 mL 0    Sig: INJECT 1 MG INTO THE SKIN ONE TIME PER WEEK     Endocrinology:  Diabetes - GLP-1 Receptor Agonists - semaglutide  Failed - 04/15/2024 10:29 AM      Failed - HBA1C in normal range and within 180 days    Hemoglobin A1C  Date Value Ref Range Status  12/31/2023 6.3 (A) 4.0 - 5.6 % Final   HbA1c, POC (controlled diabetic range)  Date Value Ref Range Status  02/01/2020 6.2 0.0 - 7.0 % Final   Hgb A1c MFr Bld  Date Value Ref Range Status  12/31/2022 6.2 (H) 4.8 - 5.6 % Final    Comment:    (NOTE)         Prediabetes: 5.7 - 6.4         Diabetes: >6.4         Glycemic control for adults with diabetes: <7.0          Passed - Cr in normal range and within 360 days    Creat  Date Value Ref Range Status  09/02/2023 0.96 0.50 - 1.05 mg/dL Final   Creatinine, Urine  Date Value Ref Range Status  09/02/2023 96 20 - 275 mg/dL Final         Passed - Valid encounter within last 6 months    Recent Outpatient Visits           3 months ago Controlled type 2 diabetes mellitus with microalbuminuria, without long-term current use of insulin [facility])   [facility] Arleen Lacer, MD       Future Appointments             In 3 weeks Sowles, Krichna, MD [facility], [facility]

## 2024-04-27 ENCOUNTER — Ambulatory Visit
Admission: RE | Admit: 2024-04-27 | Discharge: 2024-04-27 | Disposition: A | Discharge status: Home / Self Care | Source: Ambulatory Visit | Attending: Family Medicine | Admitting: Family Medicine

## 2024-04-27 DIAGNOSIS — Z1231 Encounter for screening mammogram for malignant neoplasm of breast: Secondary | ICD-10-CM | POA: Insufficient documentation

## 2024-05-07 ENCOUNTER — Encounter: Payer: Self-pay | Admitting: Family Medicine

## 2024-05-07 ENCOUNTER — Ambulatory Visit: Admitting: Family Medicine

## 2024-05-07 VITALS — BP 132/76 | HR 97 | Resp 16 | Ht 62.0 in | Wt 213.9 lb

## 2024-05-07 DIAGNOSIS — E559 Vitamin D deficiency, unspecified: Secondary | ICD-10-CM

## 2024-05-07 DIAGNOSIS — I2510 Atherosclerotic heart disease of native coronary artery without angina pectoris: Secondary | ICD-10-CM

## 2024-05-07 DIAGNOSIS — I255 Ischemic cardiomyopathy: Secondary | ICD-10-CM | POA: Diagnosis not present

## 2024-05-07 DIAGNOSIS — R809 Proteinuria, unspecified: Secondary | ICD-10-CM

## 2024-05-07 DIAGNOSIS — E1129 Type 2 diabetes mellitus with other diabetic kidney complication: Secondary | ICD-10-CM | POA: Diagnosis not present

## 2024-05-07 DIAGNOSIS — K219 Gastro-esophageal reflux disease without esophagitis: Secondary | ICD-10-CM

## 2024-05-07 DIAGNOSIS — E1169 Type 2 diabetes mellitus with other specified complication: Secondary | ICD-10-CM | POA: Diagnosis not present

## 2024-05-07 DIAGNOSIS — Z9889 Other specified postprocedural states: Secondary | ICD-10-CM

## 2024-05-07 DIAGNOSIS — I10 Essential (primary) hypertension: Secondary | ICD-10-CM

## 2024-05-07 DIAGNOSIS — M170 Bilateral primary osteoarthritis of knee: Secondary | ICD-10-CM

## 2024-05-07 DIAGNOSIS — E213 Hyperparathyroidism, unspecified: Secondary | ICD-10-CM

## 2024-05-07 LAB — POCT GLYCOSYLATED HEMOGLOBIN (HGB A1C): Hemoglobin A1C: 6 % — AB (ref 4.0–5.6)

## 2024-05-07 MED ORDER — CARVEDILOL 3.125 MG PO TABS: 3.1250 mg | ORAL_TABLET | Freq: Two times a day (BID) | ORAL | 1 refills | Status: DC

## 2024-05-07 MED ORDER — ATORVASTATIN CALCIUM 80 MG PO TABS
80.0000 mg | ORAL_TABLET | Freq: Every day | ORAL | 1 refills | Status: AC
Start: 2024-05-07 — End: ?

## 2024-05-07 NOTE — Progress Notes (Signed)
 Name: Kathy Price   MRN: [id number]    DOB: [birth year]   Date:05/07/2024       Progress Note  Subjective  Chief Complaint  Chief Complaint  Patient presents with   Medical Management of Chronic Issues   Discussed the use of AI scribe software for clinical note transcription with the patient, who gave verbal consent to proceed.  History of Present Illness Kathy Price is a 68 year old female with type 2 diabetes who presents for a follow-up visit regarding her diabetes management.  Her type 2 diabetes management has shown improvement with her A1c decreasing from 6.3 to 6.0 over the past three and a half months. She is currently on Ozempic  and Jardiance , experiencing no side effects. Her microalbuminuria decreased from 104 to 14. She reports no symptoms of excessive hunger, thirst, or frequent urination.  She has a history of dyslipidemia with an LDL cholesterol level of 127, which is above the target for diabetic patients. She is on atorvastatin  80 mg daily without any issues. Previously, she was not taking atorvastatin  regularly.  Her ischemic dilated cardiomyopathy and coronary artery disease are managed with carvedilol  3.125 mg, spironolactone  25 mg, and Entresto . She reports no chest pain, palpitations, leg swelling, or shortness of breath, and can lay flat at night without difficulty.  She underwent parathyroid  surgery in March of 2024  for hyperparathyroidism. She feels a lump in her neck but denies any associated pain. Repeat Pth last Fall was positive but did not follow up with Endo or surgeon and we will recheck level today   She experiences occasional gastroesophageal reflux disease symptoms, such as heartburn and indigestion, and uses over-the-counter antacids as needed, typically less than once a week.  She has arthritis in both knees, with the right knee causing more significant discomfort. She receives injections from Dr. Cleotilde with limited relief. She is considering knee  replacement surgery but  not ready to have it done yet   She has HTN and is compliant with medications. Her blood pressure at home is typically around 135/70. She has a BMI of 39 and has been losing weight, which she feels has improved her energy levels.    Patient Active Problem List   Diagnosis Date Noted   History of parathyroidectomy 01/02/2023   Primary osteoarthritis of both knees 08/15/2022   Helicobacter pylori gastritis 11/06/2016   Ischemic dilated cardiomyopathy (HCC) 01/30/2016   CAD S/P percutaneous coronary angioplasty 09/07/2015   Dyslipidemia associated with type 2 diabetes mellitus (HCC) 09/07/2015   Dyslipidemia 09/07/2015   H/O: hysterectomy 09/07/2015   H/O acute myocardial infarction 09/07/2015   Vitamin D  deficiency 09/07/2015   Morbid obesity (HCC) 09/07/2015   Bradycardia 05/13/2014   Arteriosclerosis of coronary artery 05/13/2014   Essential (primary) hypertension 05/13/2014   UARS (upper airway resistance syndrome) 01/23/2013    Past Surgical History:  Procedure Laterality Date   ABDOMINAL HYSTERECTOMY     COLONOSCOPY     COLONOSCOPY WITH PROPOFOL  N/A 05/09/2020   Procedure: COLONOSCOPY WITH PROPOFOL ;  Surgeon: Therisa Bi, MD;  Location: [facility] ENDOSCOPY;  Service: Gastroenterology;  Laterality: N/A;   CORONARY ANGIOPLASTY  2002   stents   ESOPHAGOGASTRODUODENOSCOPY (EGD) WITH PROPOFOL  N/A 11/05/2016   Procedure: ESOPHAGOGASTRODUODENOSCOPY (EGD) WITH PROPOFOL ;  Surgeon: Bi Therisa, MD;  Location: [facility] ENDOSCOPY;  Service: Endoscopy;  Laterality: N/A;   PARATHYROIDECTOMY N/A 01/01/2023   Procedure: LEFT SUPERIOR AND INFERIOR PARATHYROIDECTOMY;  Surgeon: Kinsinger, Herlene Righter, MD;  Location: [facility] ORS;  Service: General;  Laterality: N/A;    Family History  Problem Relation Age of Onset   Diabetes Mother    Hypertension Mother    Cancer Father    CAD Brother    Breast cancer Sister 12    Social History   Tobacco Use   Smoking status: Former    Current  packs/day: 0.00    Average packs/day: 1 pack/day for 25.0 years (25.0 ttl pk-yrs)    Types: Cigarettes    Start date: 09/07/1975    Quit date: 09/06/2000    Years since quitting: 23.6    Passive exposure: Current (husband smokes inside)   Smokeless tobacco: Never   Tobacco comments:    Smoking cessation materials not required  Substance Use Topics   Alcohol use: No    Alcohol/week: 0.0 standard drinks of alcohol     Current Outpatient Medications:    Accu-Chek Softclix Lancets lancets, Use to check blood sugar once daily, Disp: 100 each, Rfl: 3   acetaminophen  (TYLENOL ) 500 MG tablet, Take 1,000 mg by mouth every 6 (six) hours as needed for moderate pain., Disp: , Rfl:    aspirin EC 81 MG tablet, Take 1 tablet by mouth daily., Disp: , Rfl:    Blood Glucose Monitoring Suppl (ACCU-CHEK GUIDE ME) w/Device KIT, Per insurance preference for coverage, Disp: 1 kit, Rfl: 0   Cholecalciferol (VITAMIN D3) 2000 UNITS capsule, Take 1 capsule by mouth daily., Disp: , Rfl:    empagliflozin  (JARDIANCE ) 25 MG TABS tablet, Take 1 tablet (25 mg total) by mouth daily., Disp: 90 tablet, Rfl: 3   glucose blood (ACCU-CHEK GUIDE TEST) test strip, Use to check blood sugar once daily, Disp: 100 each, Rfl: 3   Multiple Vitamins-Minerals (WOMENS MULTIVITAMIN PO), Take 1 tablet by mouth. , Disp: , Rfl:    sacubitril -valsartan  (ENTRESTO ) 24-26 MG, Take 1 tablet by mouth 2 (two) times daily., Disp: , Rfl:    Semaglutide , 1 MG/DOSE, (OZEMPIC , 1 MG/DOSE,) 4 MG/3ML SOPN, INJECT 1 MG INTO THE SKIN ONE TIME PER WEEK, Disp: 6 mL, Rfl: 0   spironolactone  (ALDACTONE ) 25 MG tablet, Take 12.5 mg by mouth daily., Disp: , Rfl:    atorvastatin  (LIPITOR) 80 MG tablet, Take 1 tablet (80 mg total) by mouth daily., Disp: 90 tablet, Rfl: 1   carvedilol  (COREG ) 3.125 MG tablet, Take 1 tablet (3.125 mg total) by mouth 2 (two) times daily with a meal., Disp: 180 tablet, Rfl: 1  Allergies  Allergen Reactions   Metoprolol Other (See  Comments)    severe bradycardia   Pantoprazole  Rash   Sulfa Antibiotics Rash and Hives    I personally reviewed active problem list, medication list, allergies with the patient/caregiver today.   ROS  Ten systems reviewed and is negative except as mentioned in HPI    Objective Physical Exam  CONSTITUTIONAL: Patient appears well-developed and well-nourished. No distress. HEENT: Head atraumatic, normocephalic, neck supple. CARDIOVASCULAR: Normal rate, regular rhythm and normal heart sounds. No murmur heard. No BLE edema. PULMONARY: Effort normal and breath sounds normal. Lungs clear to auscultation. No respiratory distress. ABDOMINAL: There is no tenderness or distention. MUSCULOSKELETAL: Normal gait. Without gross motor or sensory deficit. PSYCHIATRIC: Patient has a normal mood and affect. Behavior is normal. Judgment and thought content normal. NEUROLOGICAL: Sensation symmetric and intact.  Vitals:   05/07/24 0814  BP: 132/76  Pulse: 97  Resp: 16  SpO2: 99%  Weight: 213 lb 14.4 oz (97 kg)  Height: 5' 2 (1.575 m)    Body  mass index is 39.12 kg/m.  Recent Results (from the past 2160 hours)  POCT glycosylated hemoglobin (Hb A1C)     Status: Abnormal   Collection Time: 05/07/24  8:17 AM  Result Value Ref Range   Hemoglobin A1C 6.0 (A) 4.0 - 5.6 %   HbA1c POC (<> result, manual entry)     HbA1c, POC (prediabetic range)     HbA1c, POC (controlled diabetic range)      Diabetic Foot Exam:  Diabetic foot exam was performed with the following findings:   Normal sensation of 10g monofilament Intact posterior tibialis and dorsalis pedis pulses Thick toenails      PHQ2/9:    05/07/2024    8:08 AM 12/31/2023    9:41 AM 09/02/2023    9:06 AM 04/18/2023    8:16 AM 02/14/2023    8:03 AM  Depression screen PHQ 2/9  Decreased Interest 0 0 0 0 0  Down, Depressed, Hopeless 0 0 0 0 0  PHQ - 2 Score 0 0 0 0 0  Altered sleeping  0 0  0  Tired, decreased energy  0 0  0   Change in appetite  0 0  0  Feeling bad or failure about yourself   0 0  0  Trouble concentrating  0 0  0  Moving slowly or fidgety/restless  0 0  0  Suicidal thoughts  0 0  0  PHQ-9 Score  0 0  0  Difficult doing work/chores  Not difficult at all       phq 9 is negative  Fall Risk:    05/07/2024    8:08 AM 12/31/2023    9:40 AM 09/02/2023    9:06 AM 04/18/2023    8:13 AM 02/14/2023    8:02 AM  Fall Risk   Falls in the past year? 0 0 0 0 0  Number falls in past yr: 0 0 0 0 0  Injury with Fall? 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 Microalbuminuria improved with Jardiance . No diabetes symptoms. Medications tolerated well. - Continue Jardiance  for renal protection and diabetes management. - Continue Ozempic  for glycemic control and weight loss. - Order comprehensive metabolic panel , urine micro and lipid panel today. - Schedule foot exam today. - Request copy of recent eye exam.  Ischemic dilated cardiomyopathy and coronary artery disease, status post stent Managed with carvedilol , spironolactone , and Entresto . No heart failure symptoms. Jardiance  aids cardiomyopathy management. - Continue carvedilol , spironolactone , and Entresto . - Continue Jardiance  for cardiomyopathy management. - Order comprehensive metabolic panel and lipid panel today.  Dyslipidemia associated with DM and CAD LDL at 127, above target for diabetes. Atorvastatin  80 mg now taken regularly. - Continue atorvastatin  80 mg daily. - Order lipid panel today to assess current levels.  Morbid obesity (BMI  39) BMI 39, improved from over 40, however still above 35 with co-morbidities such as DM and CAD. Weight loss from 226 lbs to 213.9 lbs since March. Contributing to improved blood pressure and diabetes control. - Continue current weight loss efforts. - Monitor BMI and weight.  Primary osteoarthritis of both knees, worse on  right Knee injections ineffective. Considering knee replacement surgery if pain impacts quality of life. Weight loss may help symptoms. - Continue Tylenol  500 mg up to four times daily for pain. - Use topical medications like Voltaren and apply ice as needed. - Consider knee replacement surgery if pain significantly impacts quality of life.  Gastroesophageal reflux disease (GERD) Managed with over-the-counter antacids as needed. Symptoms mild and infrequent. - Continue using over-the-counter antacids as needed.  Hyperparathyroidism, status post parathyroidectomy Lump on neck likely scar tissue. Parathyroid  hormone levels high in November, re-evaluation needed. - Order parathyroid  hormone level today. - Refer to surgeon or endocrinologist if parathyroid  hormone remains elevated.

## 2024-05-08 ENCOUNTER — Ambulatory Visit: Payer: Self-pay | Admitting: Family Medicine

## 2024-05-08 LAB — LIPID PANEL
Cholesterol: 219 mg/dL — ABNORMAL HIGH (ref <200)
HDL: 52 mg/dL (ref >=50)
LDL Cholesterol (Calc): 137 mg/dL — ABNORMAL HIGH
Non-HDL Cholesterol (Calc): 167 mg/dL — ABNORMAL HIGH (ref <130)
Total CHOL/HDL Ratio: 4.2 (calc) (ref <5.0)
Triglycerides: 161 mg/dL — ABNORMAL HIGH (ref <150)

## 2024-05-08 LAB — CBC WITH DIFFERENTIAL/PLATELET
Absolute Lymphocytes: 2273 {cells}/uL (ref 850–3900)
Absolute Monocytes: 323 {cells}/uL (ref 200–950)
Basophils Absolute: 38 {cells}/uL (ref 0–200)
Basophils Relative: 0.5 %
Eosinophils Absolute: 180 {cells}/uL (ref 15–500)
Eosinophils Relative: 2.4 %
HCT: 41.8 % (ref 35.0–45.0)
Hemoglobin: 13.5 g/dL (ref 11.7–15.5)
MCH: 28.4 pg (ref 27.0–33.0)
MCHC: 32.3 g/dL (ref 32.0–36.0)
MCV: 88 fL (ref 80.0–100.0)
MPV: 10.8 fL (ref 7.5–12.5)
Monocytes Relative: 4.3 %
Neutro Abs: 4688 {cells}/uL (ref 1500–7800)
Neutrophils Relative %: 62.5 %
Platelets: 254 Thousand/uL (ref 140–400)
RBC: 4.75 Million/uL (ref 3.80–5.10)
RDW: 14.1 % (ref 11.0–15.0)
Total Lymphocyte: 30.3 %
WBC: 7.5 Thousand/uL (ref 3.8–10.8)

## 2024-05-08 LAB — COMPREHENSIVE METABOLIC PANEL WITH GFR
AG Ratio: 1.7 (calc) (ref 1.0–2.5)
ALT: 12 U/L (ref 6–29)
AST: 14 U/L (ref 10–35)
Albumin: 4.7 g/dL (ref 3.6–5.1)
Alkaline phosphatase (APISO): 73 U/L (ref 37–153)
BUN: 13 mg/dL (ref 7–25)
CO2: 27 mmol/L (ref 20–32)
Calcium: 10.5 mg/dL — ABNORMAL HIGH (ref 8.6–10.4)
Chloride: 106 mmol/L (ref 98–110)
Creat: 1.04 mg/dL (ref 0.50–1.05)
Globulin: 2.8 g/dL (ref 1.9–3.7)
Glucose, Bld: 96 mg/dL (ref 65–99)
Potassium: 4.5 mmol/L (ref 3.5–5.3)
Sodium: 142 mmol/L (ref 135–146)
Total Bilirubin: 0.5 mg/dL (ref 0.2–1.2)
Total Protein: 7.5 g/dL (ref 6.1–8.1)
eGFR: 58 mL/min/1.73m2 — ABNORMAL LOW (ref >=60)

## 2024-05-08 LAB — MICROALBUMIN / CREATININE URINE RATIO
Creatinine, Urine: 55 mg/dL (ref 20–275)
Microalb Creat Ratio: 15 mg/g{creat} (ref <30)
Microalb, Ur: 0.8 mg/dL

## 2024-05-08 LAB — PARATHYROID HORMONE, INTACT (NO CA): PTH: 50 pg/mL (ref 16–77)

## 2024-05-14 ENCOUNTER — Ambulatory Visit: Payer: Self-pay | Admitting: *Deleted

## 2024-05-14 ENCOUNTER — Ambulatory Visit: Payer: Self-pay | Admitting: Nurse Practitioner

## 2024-05-14 ENCOUNTER — Ambulatory Visit
Admission: RE | Admit: 2024-05-14 | Discharge: 2024-05-14 | Disposition: A | Discharge status: Home / Self Care | Source: Ambulatory Visit | Attending: Nurse Practitioner | Admitting: Nurse Practitioner

## 2024-05-14 ENCOUNTER — Encounter: Payer: Self-pay | Admitting: Nurse Practitioner

## 2024-05-14 ENCOUNTER — Ambulatory Visit (INDEPENDENT_AMBULATORY_CARE_PROVIDER_SITE_OTHER): Admitting: Nurse Practitioner

## 2024-05-14 VITALS — BP 130/76 | HR 101 | Resp 16 | Ht 62.0 in | Wt 209.4 lb

## 2024-05-14 DIAGNOSIS — R110 Nausea: Secondary | ICD-10-CM | POA: Insufficient documentation

## 2024-05-14 DIAGNOSIS — R1012 Left upper quadrant pain: Secondary | ICD-10-CM | POA: Diagnosis not present

## 2024-05-14 LAB — POCT URINALYSIS DIPSTICK
Bilirubin, UA: NEGATIVE
Glucose, UA: POSITIVE — AB
Ketones, UA: NEGATIVE
Nitrite, UA: POSITIVE
Protein, UA: NEGATIVE
Spec Grav, UA: 1.01 (ref 1.010–1.025)
Urobilinogen, UA: 0.2 U/dL
pH, UA: 5 (ref 5.0–8.0)

## 2024-05-14 MED ORDER — ONDANSETRON 4 MG PO TBDP
4.0000 mg | ORAL_TABLET | Freq: Three times a day (TID) | ORAL | 0 refills | Status: DC | PRN
Start: 2024-05-14 — End: 2024-09-07

## 2024-05-14 MED ORDER — IOHEXOL 300 MG/ML  SOLN
100.0000 mL | Freq: Once | INTRAMUSCULAR | Status: AC | PRN
  Administered 2024-05-14: 100 mL via INTRAVENOUS

## 2024-05-14 NOTE — Progress Notes (Signed)
 BP 130/76   Pulse (!) 101   Resp 16   Ht 5' 2 (1.575 m)   Wt 209 lb 6.4 oz (95 kg)   SpO2 97%   BMI 38.30 kg/m    Subjective:    Patient ID: Kathy Price Level, female    DOB: Apr 13, 1956, 68 y.o.   MRN: [id number]  HPI: Kathy Price is a 68 y.o. female  Chief Complaint  Patient presents with   Dizziness    When Standing up, onset since 3am today,   Abdominal Pain    Umbilical region, able to keep food and fluids, has been doing some burping but did take some antacids OTC   Nausea    Discussed the use of AI scribe software for clinical note transcription with the patient, who gave verbal consent to proceed.  History of Present Illness Kathy Price is a 68 year old female who presents with abdominal pain, nausea, dizziness, and mild headache.  She experiences abdominal pain accompanied by nausea, both of which intensify upon standing. The dizziness is described as a sensation of 'spinning' and also worsens when standing. She reports a mild headache, described as 'just enough to say, hey, my head hurts'.  Approximately four to five years ago, she experienced similar symptoms and was diagnosed with an H. pylori infection. She has been consuming fruits such as grapes, watermelon, cantaloupe, strawberries, and blueberries, ensuring they are rinsed thoroughly. Despite this, she is concerned about the recurrence of her symptoms.  No diarrhea, constipation, fever, chest pain, shortness of breath, or urinary symptoms. She reports normal bowel movements. Her last meal was the previous night, and she has been drinking water since then. She recalls having labs done recently.  She is also currently on ozempic  for diabetes.  Reports she has been tolerating it well. But still on the differential.   During exam she was tender to the LUQ and LLQ abdomen.  Bowel sounds were normal.           05/14/2024   12:48 PM 05/07/2024    8:08 AM 12/31/2023    9:41 AM  Depression screen PHQ 2/9   Decreased Interest 0 0 0  Down, Depressed, Hopeless 0 0 0  PHQ - 2 Score 0 0 0  Altered sleeping   0  Tired, decreased energy   0  Change in appetite   0  Feeling bad or failure about yourself    0  Trouble concentrating   0  Moving slowly or fidgety/restless   0  Suicidal thoughts   0  PHQ-9 Score   0  Difficult doing work/chores   Not difficult at all    Relevant past medical, surgical, family and social history reviewed and updated as indicated. Interim medical history since our last visit reviewed. Allergies and medications reviewed and updated.  Review of Systems  Ten systems reviewed and is negative except as mentioned in HPI      Objective:     BP 130/76   Pulse (!) 101   Resp 16   Ht 5' 2 (1.575 m)   Wt 209 lb 6.4 oz (95 kg)   SpO2 97%   BMI 38.30 kg/m    Wt Readings from Last 3 Encounters:  05/14/24 209 lb 6.4 oz (95 kg)  05/07/24 213 lb 14.4 oz (97 kg)  12/31/23 225 lb 9.6 oz (102.3 kg)    Physical Exam Physical Exam GENERAL: Alert, cooperative, well developed, no acute distress. HEENT: Normocephalic,  normal oropharynx, moist mucous membranes. CHEST: Clear to auscultation bilaterally, no wheezes, rhonchi, or crackles. CARDIOVASCULAR: Normal heart rate and rhythm, S1 and S2 normal without murmurs. ABDOMEN: Soft, tender on palpation in the LUQ and LLQ, non-distended, without organomegaly, normal bowel sounds. EXTREMITIES: No cyanosis or edema. NEUROLOGICAL: Cranial nerves grossly intact, moves all extremities without gross motor or sensory deficit, motor function normal, proprioception normal.   Results for orders placed or performed in visit on 05/14/24  POCT urinalysis dipstick   Collection Time: 05/14/24  1:22 PM  Result Value Ref Range   Color, UA Yellow    Clarity, UA Cloudy    Glucose, UA Positive (A) Negative   Bilirubin, UA Neg    Ketones, UA Neg    Spec Grav, UA 1.010 1.010 - 1.025   Blood, UA Trace    pH, UA 5.0 5.0 - 8.0   Protein, UA  Negative Negative   Urobilinogen, UA 0.2 0.2 or 1.0 E.U./dL   Nitrite, UA Positive    Leukocytes, UA Trace (A) Negative   Appearance yellow    Odor none           Assessment & Plan:   Problem List Items Addressed This Visit   None Visit Diagnoses       Left upper quadrant abdominal pain    -  Primary   Relevant Orders   CBC with Differential/Platelet   Comprehensive metabolic panel with GFR   Lipase   POCT urinalysis dipstick (Completed)   H. pylori breath test   CT ABDOMEN PELVIS W CONTRAST   Urine Culture     Nausea       Relevant Medications   ondansetron  (ZOFRAN -ODT) 4 MG disintegrating tablet   Other Relevant Orders   CBC with Differential/Platelet   Comprehensive metabolic panel with GFR   Lipase   POCT urinalysis dipstick (Completed)   H. pylori breath test   CT ABDOMEN PELVIS W CONTRAST        Assessment and Plan Assessment & Plan Hx of h pylori H. pylori infection approximately 4-5 years ago. Current symptoms of abdominal pain, nausea, dizziness, and mild headache may indicate recurrence.  - Order H. pylori test - Order blood work including blood counts and pancreatic enzymes - Order urine analysis  Abdominal pain with associated nausea, dizziness, and mild headache Acute abdominal pain with associated nausea, dizziness, and mild headache, exacerbated by movement. Tenderness on abdominal examination. She is also currently on ozempic  for diabetes.  Reports she has been tolerating it well. But still on the differential. CT scan planned for further evaluation. Nausea medication to be prescribed. Advised to maintain hydration and follow a liquid diet. Informed about potential use of contrast in CT scan and its effects.  - Order CT scan of the abdomen - Prescribe nausea medication to [facility] on [address] - Advise to maintain hydration and follow a liquid diet - Instruct to go to the emergency room if pain worsens significantly        Follow up plan: Return  if symptoms worsen or fail to improve.

## 2024-05-14 NOTE — Telephone Encounter (Signed)
 FYI Only or Action Required?: FYI only for provider.  Patient was last seen in primary care on 05/07/2024 by Glenard Mire, MD.  Called Nurse Triage reporting Dizziness.  Symptoms began today.  Interventions attempted: Rest, hydration, or home remedies.  Symptoms are: unchanged.  Triage Disposition: See Physician Within 24 Hours  Patient/caregiver understands and will follow disposition?: yes   Reason for Disposition  [1] MODERATE dizziness (e.g., interferes with normal activities) AND [2] has NOT been evaluated by doctor (or NP/PA) for this  (Exception: Dizziness caused by heat exposure, sudden standing, or poor fluid intake.)  Answer Assessment - Initial Assessment Questions 1. DESCRIPTION: Describe your dizziness.     Nausea, slight headache, spinning sensation this morning- now just dizziness 2. LIGHTHEADED: Do you feel lightheaded? (e.g., somewhat faint, woozy, weak upon standing)     Off balance 3. VERTIGO: Do you feel like either you or the room is spinning or tilting? (i.e., vertigo)     yes 4. SEVERITY: How bad is it?  Do you feel like you are going to faint? Can you stand and walk?     This morning- patient is holding on to steady 5. ONSET:  When did the dizziness begin?     Started today- possible symptoms last night 6. AGGRAVATING FACTORS: Does anything make it worse? (e.g., standing, change in head position)     Getting up 7. HEART RATE: Can you tell me your heart rate? How many beats in 15 seconds?  (Note: Not all patients can do this.)       132/82, not checked 8. CAUSE: What do you think is causing the dizziness? (e.g., decreased fluids or food, diarrhea, emotional distress, heat exposure, new medicine, sudden standing, vomiting; unknown)     Unknown, patient did get steriod shot in knee yesterday- never had reaction in the past 9. RECURRENT SYMPTOM: Have you had dizziness before? If Yes, ask: When was the last time? What happened that  time?     Yes- was seen before 10. OTHER SYMPTOMS: Do you have any other symptoms? (e.g., fever, chest pain, vomiting, diarrhea, bleeding)       no  Protocols used: Dizziness - Lightheadedness-A-AH   Copied from CRM [id number]. Topic: Appointments - Appointment Scheduling >> May 14, 2024 10:48 AM Winona R wrote: nausea, dizziness, headaches. Would like to be seen today.

## 2024-05-15 ENCOUNTER — Ambulatory Visit: Admitting: Family Medicine

## 2024-05-15 LAB — CBC WITH DIFFERENTIAL/PLATELET
Absolute Lymphocytes: 1363 {cells}/uL (ref 850–3900)
Absolute Monocytes: 316 {cells}/uL (ref 200–950)
Basophils Absolute: 0 {cells}/uL (ref 0–200)
Basophils Relative: 0 %
Eosinophils Absolute: 0 {cells}/uL — ABNORMAL LOW (ref 15–500)
Eosinophils Relative: 0 %
HCT: 42.4 % (ref 35.0–45.0)
Hemoglobin: 13.7 g/dL (ref 11.7–15.5)
MCH: 28 pg (ref 27.0–33.0)
MCHC: 32.3 g/dL (ref 32.0–36.0)
MCV: 86.7 fL (ref 80.0–100.0)
MPV: 10.8 fL (ref 7.5–12.5)
Monocytes Relative: 2.9 %
Neutro Abs: 9221 {cells}/uL — ABNORMAL HIGH (ref 1500–7800)
Neutrophils Relative %: 84.6 %
Platelets: 292 Thousand/uL (ref 140–400)
RBC: 4.89 Million/uL (ref 3.80–5.10)
RDW: 14 % (ref 11.0–15.0)
Total Lymphocyte: 12.5 %
WBC: 10.9 Thousand/uL — ABNORMAL HIGH (ref 3.8–10.8)

## 2024-05-15 LAB — COMPREHENSIVE METABOLIC PANEL WITH GFR
AG Ratio: 1.8 (calc) (ref 1.0–2.5)
ALT: 11 U/L (ref 6–29)
AST: 12 U/L (ref 10–35)
Albumin: 4.9 g/dL (ref 3.6–5.1)
Alkaline phosphatase (APISO): 68 U/L (ref 37–153)
BUN: 14 mg/dL (ref 7–25)
CO2: 25 mmol/L (ref 20–32)
Calcium: 11.2 mg/dL — ABNORMAL HIGH (ref 8.6–10.4)
Chloride: 106 mmol/L (ref 98–110)
Creat: 1 mg/dL (ref 0.50–1.05)
Globulin: 2.7 g/dL (ref 1.9–3.7)
Glucose, Bld: 123 mg/dL — ABNORMAL HIGH (ref 65–99)
Potassium: 4.4 mmol/L (ref 3.5–5.3)
Sodium: 140 mmol/L (ref 135–146)
Total Bilirubin: 0.4 mg/dL (ref 0.2–1.2)
Total Protein: 7.6 g/dL (ref 6.1–8.1)
eGFR: 61 mL/min/1.73m2 (ref >=60)

## 2024-05-15 LAB — LIPASE: Lipase: 21 U/L (ref 7–60)

## 2024-05-15 LAB — URINE CULTURE
MICRO NUMBER:: 16713194
SPECIMEN QUALITY:: ADEQUATE

## 2024-05-19 LAB — H. PYLORI BREATH TEST: H. pylori Breath Test: NOT DETECTED

## 2024-05-20 ENCOUNTER — Ambulatory Visit: Admitting: Family Medicine

## 2024-05-25 ENCOUNTER — Ambulatory Visit: Payer: Self-pay

## 2024-05-25 NOTE — Telephone Encounter (Signed)
 FYI Only or Action Required?: FYI only for provider.  Patient was last seen in primary care on 05/14/2024 by Gareth Mliss FALCON, FNP.  Called Nurse Triage reporting Headache.  Symptoms began several weeks ago.  Interventions attempted: OTC medications: tylenol .  Symptoms are: unchanged.  Triage Disposition: See PCP When Office is Open (Within 3 Days)  Patient/caregiver understands and will follow disposition?: Yes  Message from Avram MATSU sent at 05/25/2024  9:46 AM EDT  Pt stated she has headaches everyday and would like to know if someone can give her a call about what to do [phone]   Reason for Disposition  [1] MILD-MODERATE headache AND [2] present > 3 days (72 hours) AND [3] no improvement after using Care Advice  Answer Assessment - Initial Assessment Questions 1. LOCATION: Where does it hurt?      Back of head, then extends around her head 2. ONSET: When did the headache start? (e.g., minutes, hours, days)      Two weeks ago 3. PATTERN: Does the pain come and go, or has it been constant since it started?     Patient takes tylenol , but it comes back 4. SEVERITY: How bad is the pain? and What does it keep you from doing?  (e.g., Scale 1-10; mild, moderate, or severe)     8/10 at worse 5. RECURRENT SYMPTOM: Have you ever had headaches before? If Yes, ask: When was the last time? and What happened that time?      Yes, has had headaches from time to time in the past, but not like this 6. CAUSE: What do you think is causing the headache?     unknown 7. MIGRAINE: Have you been diagnosed with migraine headaches? If Yes, ask: Is this headache similar?     Yes, over 20 years ago.  This headache pain is similar 8. HEAD INJURY: Has there been any recent injury to your head?      denies 9. OTHER SYMPTOMS: Do you have any other symptoms? (e.g., fever, stiff neck, eye pain, sore throat, cold symptoms)     denies  Protocols used: Headache-A-AH

## 2024-05-25 NOTE — Telephone Encounter (Signed)
 Need appointment for new onset headache

## 2024-05-25 NOTE — Telephone Encounter (Signed)
 Appointment scheduled for 05/27/24 with Dr Glenard

## 2024-05-27 ENCOUNTER — Telehealth: Payer: Self-pay | Admitting: Pharmacy Technician

## 2024-05-27 ENCOUNTER — Other Ambulatory Visit (HOSPITAL_COMMUNITY): Payer: Self-pay

## 2024-05-27 ENCOUNTER — Ambulatory Visit (INDEPENDENT_AMBULATORY_CARE_PROVIDER_SITE_OTHER): Admitting: Family Medicine

## 2024-05-27 ENCOUNTER — Encounter: Payer: Self-pay | Admitting: Family Medicine

## 2024-05-27 VITALS — BP 132/76 | HR 94 | Resp 16 | Ht 62.0 in | Wt 210.8 lb

## 2024-05-27 DIAGNOSIS — I700 Atherosclerosis of aorta: Secondary | ICD-10-CM | POA: Diagnosis not present

## 2024-05-27 DIAGNOSIS — F4321 Adjustment disorder with depressed mood: Secondary | ICD-10-CM

## 2024-05-27 DIAGNOSIS — G43009 Migraine without aura, not intractable, without status migrainosus: Secondary | ICD-10-CM | POA: Diagnosis not present

## 2024-05-27 DIAGNOSIS — G44209 Tension-type headache, unspecified, not intractable: Secondary | ICD-10-CM

## 2024-05-27 MED ORDER — BACLOFEN 10 MG PO TABS: 10.0000 mg | ORAL_TABLET | Freq: Three times a day (TID) | ORAL | 0 refills | Status: DC | PRN

## 2024-05-27 MED ORDER — NURTEC 75 MG PO TBDP: 1.0000 | ORAL_TABLET | ORAL | 2 refills | Status: AC

## 2024-05-27 NOTE — Telephone Encounter (Signed)
 Prior Authorization form/request asks a question that requires your assistance. Please see the question below and advise accordingly. The PA will not be submitted until the necessary information is received.     Thanks!

## 2024-05-27 NOTE — Telephone Encounter (Signed)
 Thanks so much Kathy Price, just wanted to double check!!

## 2024-05-27 NOTE — Progress Notes (Signed)
 Name: Kathy Price   MRN: [id number]    DOB: 1956/04/06   Date:05/27/2024       Progress Note  Subjective  Chief Complaint  Chief Complaint  Patient presents with   Headache    X2 weeks getting worst, tylenol  relieves some, starts from back of head and radiates to the front    Discussed the use of AI scribe software for clinical note transcription with the patient, who gave verbal consent to proceed.  History of Present Illness Kathy Price is a 68 year old female with diabetes, cardiomyopathy, and hypertension who presents with persistent headaches.  She has been experiencing persistent headaches for the past week. Initially mild, the headaches have intensified, necessitating the use of Tylenol  every six hours. The pain is described as a dull, aching sensation affecting the entire head. Relief is achieved with Tylenol , but the headaches return after five to six hours. No light or sound sensitivity, nausea, vomiting, tingling, or numbness are associated with the headaches. She notes occasional mild dizziness when standing, which is not related to the headaches.  She has a history of migraines from approximately 20 years ago, characterized by intense pain relieved by resting in a dark room. However, she has not experienced migraines recently and has not been formally diagnosed with them. During her past migraine episodes, she used Tylenol  or ibuprofen for relief.  Her past medical history includes diabetes, cardiomyopathy, and hypertension. She had a CT scan previously for left upper quadrant pain, which revealed diverticulosis and atherosclerosis but no infection or other acute findings. She has a history of heart disease and underwent angioplasty.  Socially, she is grieving the loss of her husband, who passed away in 03-21-25from lung cancer. She lives with her 79 year old grandchild. She reports sleeping well at night and denies any significant changes in her daily routine or  caffeine intake. She tries to stay hydrated by drinking plenty of fluids.    Patient Active Problem List   Diagnosis Date Noted   History of parathyroidectomy 01/02/2023   Primary osteoarthritis of both knees 08/15/2022   Helicobacter pylori gastritis 11/06/2016   Ischemic dilated cardiomyopathy (HCC) 01/30/2016   CAD S/P percutaneous coronary angioplasty 09/07/2015   Dyslipidemia associated with type 2 diabetes mellitus (HCC) 09/07/2015   Dyslipidemia 09/07/2015   H/O: hysterectomy 09/07/2015   H/O acute myocardial infarction 09/07/2015   Vitamin D  deficiency 09/07/2015   Morbid obesity (HCC) 09/07/2015   Bradycardia 05/13/2014   Arteriosclerosis of coronary artery 05/13/2014   Essential (primary) hypertension 05/13/2014   UARS (upper airway resistance syndrome) 01/23/2013    Social History   Tobacco Use   Smoking status: Former    Current packs/day: 0.00    Average packs/day: 1 pack/day for 25.0 years (25.0 ttl pk-yrs)    Types: Cigarettes    Start date: 09/07/1975    Quit date: 09/06/2000    Years since quitting: 23.7    Passive exposure: Current (husband smokes inside)   Smokeless tobacco: Never   Tobacco comments:    Smoking cessation materials not required  Substance Use Topics   Alcohol use: No    Alcohol/week: 0.0 standard drinks of alcohol     Current Outpatient Medications:    Accu-Chek Softclix Lancets lancets, Use to check blood sugar once daily, Disp: 100 each, Rfl: 3   acetaminophen  (TYLENOL ) 500 MG tablet, Take 1,000 mg by mouth every 6 (six) hours as needed for moderate pain., Disp: , Rfl:    aspirin  EC 81 MG tablet, Take 1 tablet by mouth daily., Disp: , Rfl:    atorvastatin  (LIPITOR) 80 MG tablet, Take 1 tablet (80 mg total) by mouth daily., Disp: 90 tablet, Rfl: 1   Blood Glucose Monitoring Suppl (ACCU-CHEK GUIDE ME) w/Device KIT, Per insurance preference for coverage, Disp: 1 kit, Rfl: 0   carvedilol  (COREG ) 3.125 MG tablet, Take 1 tablet (3.125 mg  total) by mouth 2 (two) times daily with a meal., Disp: 180 tablet, Rfl: 1   Cholecalciferol (VITAMIN D3) 2000 UNITS capsule, Take 1 capsule by mouth daily., Disp: , Rfl:    empagliflozin  (JARDIANCE ) 25 MG TABS tablet, Take 1 tablet (25 mg total) by mouth daily., Disp: 90 tablet, Rfl: 3   glucose blood (ACCU-CHEK GUIDE TEST) test strip, Use to check blood sugar once daily, Disp: 100 each, Rfl: 3   Multiple Vitamins-Minerals (WOMENS MULTIVITAMIN PO), Take 1 tablet by mouth. , Disp: , Rfl:    ondansetron  (ZOFRAN -ODT) 4 MG disintegrating tablet, Take 1 tablet (4 mg total) by mouth every 8 (eight) hours as needed for nausea or vomiting., Disp: 10 tablet, Rfl: 0   sacubitril -valsartan  (ENTRESTO ) 24-26 MG, Take 1 tablet by mouth 2 (two) times daily., Disp: , Rfl:    Semaglutide , 1 MG/DOSE, (OZEMPIC , 1 MG/DOSE,) 4 MG/3ML SOPN, INJECT 1 MG INTO THE SKIN ONE TIME PER WEEK, Disp: 6 mL, Rfl: 0   spironolactone  (ALDACTONE ) 25 MG tablet, Take 12.5 mg by mouth daily., Disp: , Rfl:   Allergies  Allergen Reactions   Metoprolol Other (See Comments)    severe bradycardia   Pantoprazole  Rash   Sulfa Antibiotics Rash and Hives    ROS  Ten systems reviewed and is negative except as mentioned in HPI    Objective  Vitals:   05/27/24 1300  BP: 132/76  Pulse: 94  Resp: 16  SpO2: 96%  Weight: 210 lb 12.8 oz (95.6 kg)  Height: 5' 2 (1.575 m)    Body mass index is 38.56 kg/m.    Physical Exam CONSTITUTIONAL: Patient appears well-developed and well-nourished. No distress. Normal exam findings. HEENT: Head atraumatic, normocephalic, neck supple. CARDIOVASCULAR: Normal rate, regular rhythm and normal heart sounds. No murmur heard. No BLE edema. PULMONARY: Effort normal and breath sounds normal. No respiratory distress. ABDOMINAL: There is no tenderness or distention. MUSCULOSKELETAL: Normal gait. Without gross motor or sensory deficit. PSYCHIATRIC: Patient has a normal mood and affect. Behavior is  normal. Judgment and thought content normal. NEUROLOGICAL: Cranial nerves grossly intact. Motor function normal. Sensation symmetric and intact. Normal balance. Romberg negative   Recent Results (from the past 2160 hours)  POCT glycosylated hemoglobin (Hb A1C)     Status: Abnormal   Collection Time: 05/07/24  8:17 AM  Result Value Ref Range   Hemoglobin A1C 6.0 (A) 4.0 - 5.6 %   HbA1c POC (<> result, manual entry)     HbA1c, POC (prediabetic range)     HbA1c, POC (controlled diabetic range)    Lipid panel     Status: Abnormal   Collection Time: 05/07/24  8:51 AM  Result Value Ref Range   Cholesterol 219 (H) <200 mg/dL   HDL 52 > OR = 50 mg/dL   Triglycerides 838 (H) <150 mg/dL   LDL Cholesterol (Calc) 137 (H) mg/dL (calc)    Comment: Reference range: <100 . Desirable range <100 mg/dL for primary prevention;   <70 mg/dL for patients with CHD or diabetic patients  with > or = 2 CHD risk factors. SABRA  LDL-C is now calculated using the Martin-Hopkins  calculation, which is a validated novel method providing  better accuracy than the Friedewald equation in the  estimation of LDL-C.  Gladis APPLETHWAITE et al. SANDREA. 7986;689(80): 2061-2068  (http://education.[facility].com/faq/FAQ164)    Total CHOL/HDL Ratio 4.2 <5.0 (calc)   Non-HDL Cholesterol (Calc) 167 (H) <130 mg/dL (calc)    Comment: For patients with diabetes plus 1 major ASCVD risk  factor, treating to a non-HDL-C goal of <100 mg/dL  (LDL-C of <29 mg/dL) is considered a therapeutic  option.   CBC with Differential/Platelet     Status: None   Collection Time: 05/07/24  8:51 AM  Result Value Ref Range   WBC 7.5 3.8 - 10.8 Thousand/uL   RBC 4.75 3.80 - 5.10 Million/uL   Hemoglobin 13.5 11.7 - 15.5 g/dL   HCT 58.1 64.9 - 54.9 %   MCV 88.0 80.0 - 100.0 fL   MCH 28.4 27.0 - 33.0 pg   MCHC 32.3 32.0 - 36.0 g/dL    Comment: For adults, a slight decrease in the calculated MCHC value (in the range of 30 to 32 g/dL) is most  likely not clinically significant; however, it should be interpreted with caution in correlation with other red cell parameters and the patient's clinical condition.    RDW 14.1 11.0 - 15.0 %   Platelets 254 140 - 400 Thousand/uL   MPV 10.8 7.5 - 12.5 fL   Neutro Abs 4,688 1,500 - 7,800 cells/uL   Absolute Lymphocytes 2,273 850 - 3,900 cells/uL   Absolute Monocytes 323 200 - 950 cells/uL   Eosinophils Absolute 180 15 - 500 cells/uL   Basophils Absolute 38 0 - 200 cells/uL   Neutrophils Relative % 62.5 %   Total Lymphocyte 30.3 %   Monocytes Relative 4.3 %   Eosinophils Relative 2.4 %   Basophils Relative 0.5 %  Comprehensive metabolic panel with GFR     Status: Abnormal   Collection Time: 05/07/24  8:51 AM  Result Value Ref Range   Glucose, Bld 96 65 - 99 mg/dL    Comment: .            Fasting reference interval .    BUN 13 7 - 25 mg/dL   Creat 8.95 9.49 - 8.94 mg/dL   eGFR 58 (L) > OR = 60 mL/min/1.1m2   BUN/Creatinine Ratio SEE NOTE: 6 - 22 (calc)    Comment:    Not Reported: BUN and Creatinine are within    reference range. .    Sodium 142 135 - 146 mmol/L   Potassium 4.5 3.5 - 5.3 mmol/L   Chloride 106 98 - 110 mmol/L   CO2 27 20 - 32 mmol/L   Calcium  10.5 (H) 8.6 - 10.4 mg/dL   Total Protein 7.5 6.1 - 8.1 g/dL   Albumin 4.7 3.6 - 5.1 g/dL   Globulin 2.8 1.9 - 3.7 g/dL (calc)   AG Ratio 1.7 1.0 - 2.5 (calc)   Total Bilirubin 0.5 0.2 - 1.2 mg/dL   Alkaline phosphatase (APISO) 73 37 - 153 U/L   AST 14 10 - 35 U/L   ALT 12 6 - 29 U/L  Microalbumin / creatinine urine ratio     Status: None   Collection Time: 05/07/24  8:51 AM  Result Value Ref Range   Creatinine, Urine 55 20 - 275 mg/dL   Microalb, Ur 0.8 mg/dL    Comment: Reference Range Not established    Microalb Creat Ratio 15 <30  mg/g creat    Comment: . The ADA defines abnormalities in albumin excretion as follows: SABRA Albuminuria Category        Result (mg/g creatinine) . Normal to Mildly increased    <30 Moderately increased         30-299  Severely increased           > OR = 300 . The ADA recommends that at least two of three specimens collected within a 3-6 month period be abnormal before considering a patient to be within a diagnostic category.   Parathyroid  hormone, intact (no Ca)     Status: None   Collection Time: 05/07/24  8:51 AM  Result Value Ref Range   PTH 50 16 - 77 pg/mL    Comment: . Interpretive Guide    Intact PTH           Calcium  ------------------    ----------           ------- Normal Parathyroid     Normal               Normal Hypoparathyroidism    Low or Low Normal    Low Hyperparathyroidism    Primary            Normal or High       High    Secondary          High                 Normal or Low    Tertiary           High                 High Non-Parathyroid     Hypercalcemia      Low or Low Normal    High .   POCT urinalysis dipstick     Status: Abnormal   Collection Time: 05/14/24  1:22 PM  Result Value Ref Range   Color, UA Yellow    Clarity, UA Cloudy    Glucose, UA Positive (A) Negative   Bilirubin, UA Neg    Ketones, UA Neg    Spec Grav, UA 1.010 1.010 - 1.025   Blood, UA Trace    pH, UA 5.0 5.0 - 8.0   Protein, UA Negative Negative   Urobilinogen, UA 0.2 0.2 or 1.0 E.U./dL   Nitrite, UA Positive    Leukocytes, UA Trace (A) Negative   Appearance yellow    Odor none   CBC with Differential/Platelet     Status: Abnormal   Collection Time: 05/14/24  1:26 PM  Result Value Ref Range   WBC 10.9 (H) 3.8 - 10.8 Thousand/uL   RBC 4.89 3.80 - 5.10 Million/uL   Hemoglobin 13.7 11.7 - 15.5 g/dL   HCT 57.5 64.9 - 54.9 %   MCV 86.7 80.0 - 100.0 fL   MCH 28.0 27.0 - 33.0 pg   MCHC 32.3 32.0 - 36.0 g/dL    Comment: For adults, a slight decrease in the calculated MCHC value (in the range of 30 to 32 g/dL) is most likely not clinically significant; however, it should be interpreted with caution in correlation with other red cell parameters and the  patient's clinical condition.    RDW 14.0 11.0 - 15.0 %   Platelets 292 140 - 400 Thousand/uL   MPV 10.8 7.5 - 12.5 fL   Neutro Abs 9,221 (H) 1,500 - 7,800 cells/uL   Absolute Lymphocytes 1,363 850 - 3,900 cells/uL  Absolute Monocytes 316 200 - 950 cells/uL   Eosinophils Absolute 0 (L) 15 - 500 cells/uL   Basophils Absolute 0 0 - 200 cells/uL   Neutrophils Relative % 84.6 %   Total Lymphocyte 12.5 %   Monocytes Relative 2.9 %   Eosinophils Relative 0.0 %   Basophils Relative 0.0 %  Comprehensive metabolic panel with GFR     Status: Abnormal   Collection Time: 05/14/24  1:26 PM  Result Value Ref Range   Glucose, Bld 123 (H) 65 - 99 mg/dL    Comment: .            Fasting reference interval . For someone without known diabetes, a glucose value between 100 and 125 mg/dL is consistent with prediabetes and should be confirmed with a follow-up test. .    BUN 14 7 - 25 mg/dL   Creat 8.99 9.49 - 8.94 mg/dL   eGFR 61 > OR = 60 fO/fpw/8.26f7   BUN/Creatinine Ratio SEE NOTE: 6 - 22 (calc)    Comment:    Not Reported: BUN and Creatinine are within    reference range. .    Sodium 140 135 - 146 mmol/L   Potassium 4.4 3.5 - 5.3 mmol/L   Chloride 106 98 - 110 mmol/L   CO2 25 20 - 32 mmol/L   Calcium  11.2 (H) 8.6 - 10.4 mg/dL   Total Protein 7.6 6.1 - 8.1 g/dL   Albumin 4.9 3.6 - 5.1 g/dL   Globulin 2.7 1.9 - 3.7 g/dL (calc)   AG Ratio 1.8 1.0 - 2.5 (calc)   Total Bilirubin 0.4 0.2 - 1.2 mg/dL   Alkaline phosphatase (APISO) 68 37 - 153 U/L   AST 12 10 - 35 U/L   ALT 11 6 - 29 U/L  Lipase     Status: None   Collection Time: 05/14/24  1:26 PM  Result Value Ref Range   Lipase 21 7 - 60 U/L  Urine Culture     Status: None   Collection Time: 05/14/24  1:26 PM  Result Value Ref Range   MICRO NUMBER: [id number]    SPECIMEN QUALITY: Adequate    Sample Source URINE, CLEAN CATCH    STATUS: FINAL    Result:      Mixed genital flora isolated. These superficial bacteria are not  indicative of a urinary tract infection. No further organism identification is warranted on this specimen. If clinically indicated, recollect clean-catch, mid-stream urine and transfer  immediately to Urine Culture Transport Tube.   H. pylori breath test     Status: None   Collection Time: 05/15/24  8:09 AM  Result Value Ref Range   H. pylori Breath Test NOT DETECTED NOT DETECTED    Comment: . Antimicrobials, proton pump inhibitors, and bismuth preparations are known to suppress H. pylori, and  ingestion of these prior to H. pylori diagnostic testing may lead to false negative results. If clinically  indicated, the test may be repeated on a new specimen obtained two weeks after discontinuing treatment. However, a positive result is still clinically valid.      Assessment & Plan Tension-type headache/Migraine headaches Persistent dull, aching headache likely exacerbated by stress and grief. Concern for rebound headaches due to frequent Tylenol  use. - Prescribed Nurtec every other day for a month. - Prescribed baclofen  as needed up to three times a day, cautioning about potential drowsiness. - Advised against Tylenol  and ibuprofen to prevent rebound headaches. - Recommended cold compresses and relaxation techniques such  as deep breathing. - Suggested grief counseling through [facility] for support.  Grief reaction Emotional distress following husband's death contributing to headaches and increased crying. - Recommended grief counseling through [facility] for support.  Type 2 diabetes mellitus Management crucial to prevent complications, especially with coronary artery disease and atherosclerosis. - Continue current diabetes management plan.  Hypertension Blood pressure well-controlled with current medication regimen. - Continue current hypertension management plan.  Coronary artery disease, status post angioplasty Emphasis on controlling diabetes, blood  pressure, and cholesterol to prevent further plaque buildup. - Continue taking atorvastatin  for cholesterol management. - Continue current management of coronary artery disease.     There are no diagnoses linked to this encounter.

## 2024-05-27 NOTE — Telephone Encounter (Signed)
 Pharmacy Patient Advocate Encounter   Received notification from CoverMyMeds that prior authorization for Nurtec 75MG  dispersible tablets is required/requested.   Insurance verification completed.   The patient is insured through [facility] .   Per test claim: PA required; PA submitted to above mentioned insurance via CoverMyMeds Key/confirmation #/EOC [id number] Status is pending

## 2024-05-28 ENCOUNTER — Other Ambulatory Visit: Payer: Self-pay | Admitting: Family Medicine

## 2024-05-28 ENCOUNTER — Other Ambulatory Visit (HOSPITAL_COMMUNITY): Payer: Self-pay

## 2024-05-28 ENCOUNTER — Encounter: Payer: Self-pay | Admitting: Pharmacist

## 2024-05-28 ENCOUNTER — Other Ambulatory Visit: Payer: Self-pay | Admitting: Pharmacist

## 2024-05-28 DIAGNOSIS — G43009 Migraine without aura, not intractable, without status migrainosus: Secondary | ICD-10-CM

## 2024-05-28 DIAGNOSIS — Z5986 Financial insecurity: Secondary | ICD-10-CM

## 2024-05-28 NOTE — Progress Notes (Signed)
   05/28/2024  Patient ID: [name] Level, female   DOB: 11/30/1955, 68 y.o.   MRN: [id number]  Patient enrolled in Ozempic  patient assistance program from [employer].  As of 7/25, Ozempic  will no longer be eligible for automatic refill from [employer]. To request refills after this date, program must receive a refill request form from office up to 30 days prior to refill being due.  Outreach to patient today and provide this update. Advise patient to monitor supply of Ozempic  at home and when has only a 1 month supply remaining, to contact Shasta Spear: Phone: [phone] to request CPhT start refill form with PCP to be sent to program.  Sharyle Sia, PharmD, [facility] Clinical Pharmacist [facility] [phone]

## 2024-05-28 NOTE — Patient Instructions (Signed)
 As of 7/25, Ozempic  will no longer be automatically refilled from [employer] patient assistance program. To request refills after this date, program must receive a refill request form from office up to 30 days prior to refill being due.  Please monitor your supply of Ozempic  at home and when you have only a 1 month supply remaining, contact Shasta Spear at [phone] to request she start refill process for you.  Thank you!  Sharyle Sia, PharmD, [facility] Clinical Pharmacist [facility] [phone]

## 2024-05-28 NOTE — Telephone Encounter (Signed)
 Pharmacy Patient Advocate Encounter  Received notification from [facility] that Prior Authorization for Nurtec 75MG  dispersible tablets has been DENIED.  Full denial letter will be uploaded to the media tab. See denial reason below.     PA #/Case ID/Reference #: [id number]

## 2024-05-29 ENCOUNTER — Other Ambulatory Visit (HOSPITAL_COMMUNITY): Payer: Self-pay

## 2024-05-29 NOTE — Telephone Encounter (Signed)
 Kathy Price will call Nurtec Rep to get some samples

## 2024-05-29 NOTE — Telephone Encounter (Signed)
 Good morning, her plan will pay for sumatriptan or rizatriptan. I test billed both and sumatriptan was $7.59 and rizatriptan was $35.88 and her plan limits both to Maximum Daily Dose of 0.4 (meaning #9 tablets is 23 day supply per plan)

## 2024-06-01 ENCOUNTER — Telehealth: Payer: Self-pay

## 2024-06-01 NOTE — Telephone Encounter (Signed)
 Bill returned call and will stop by tomorrow morning for signature.

## 2024-06-01 NOTE — Telephone Encounter (Signed)
 Called Warren Fonder (pfizer) Friday and today lvm for samples. Also lvm today for counterpart Sherida Sluder to return call

## 2024-06-01 NOTE — Telephone Encounter (Signed)
 Advised patient PA was denied, PCP wants her to do sample of NURTEC but we are waiting to hear from drug rep.

## 2024-06-01 NOTE — Telephone Encounter (Signed)
 Copied from CRM [id number]. Topic: Clinical - Medication Prior Auth >> Jun 01, 2024  1:37 PM Sophia H wrote: Reason for CRM: patient was checking in on prior authorization for Rimegepant Sulfate (NURTEC) 75 MG TBDP, please advise. # [phone]

## 2024-06-02 NOTE — Telephone Encounter (Signed)
 Pt informed that the nurtec is ready for pick up. I also informed her to only take the pill as needed because it is only two pills in the box. Pt verbalized understanding.

## 2024-06-03 ENCOUNTER — Encounter: Payer: Self-pay | Admitting: Emergency Medicine

## 2024-06-03 ENCOUNTER — Emergency Department

## 2024-06-03 ENCOUNTER — Emergency Department
Admission: EM | Admit: 2024-06-03 | Discharge: 2024-06-03 | Disposition: A | Discharge status: Home / Self Care | Attending: Emergency Medicine | Admitting: Emergency Medicine

## 2024-06-03 ENCOUNTER — Other Ambulatory Visit: Payer: Self-pay

## 2024-06-03 DIAGNOSIS — E119 Type 2 diabetes mellitus without complications: Secondary | ICD-10-CM | POA: Insufficient documentation

## 2024-06-03 DIAGNOSIS — I10 Essential (primary) hypertension: Secondary | ICD-10-CM | POA: Diagnosis not present

## 2024-06-03 DIAGNOSIS — G44209 Tension-type headache, unspecified, not intractable: Secondary | ICD-10-CM | POA: Diagnosis not present

## 2024-06-03 DIAGNOSIS — I2510 Atherosclerotic heart disease of native coronary artery without angina pectoris: Secondary | ICD-10-CM | POA: Diagnosis not present

## 2024-06-03 DIAGNOSIS — R519 Headache, unspecified: Secondary | ICD-10-CM | POA: Diagnosis present

## 2024-06-03 MED ORDER — NAPROXEN 500 MG PO TABS: 500.0000 mg | ORAL_TABLET | Freq: Two times a day (BID) | ORAL | 2 refills | Status: DC

## 2024-06-03 NOTE — ED Provider Notes (Signed)
 [facility] Provider Note    Event Date/Time   First MD Initiated Contact with Patient 06/03/24 [phone]     (approximate)   History   Headache   HPI  Kathy Price is a 68 y.o. female with history of CAD, diabetes, hypertension who presents with complaints of headache for approximately 3 weeks.  She reports intermittent headache extending from the left posterior scalp to the right and left frontal areas.  She reports a response to Tylenol .  She did see her PCP.  No neurodeficits.  No injury to the area.  No fevers, no neck pain.     Physical Exam   Triage Vital Signs: ED Triage Vitals  Encounter Vitals Group     BP 06/03/24 0642 133/78     Girls Systolic BP Percentile --      Girls Diastolic BP Percentile --      Boys Systolic BP Percentile --      Boys Diastolic BP Percentile --      Pulse Rate 06/03/24 0642 84     Resp 06/03/24 0642 16     Temp 06/03/24 0642 98.3 F (36.8 C)     Temp Source 06/03/24 0642 Oral     SpO2 06/03/24 0642 100 %     Weight 06/03/24 0637 89.8 kg (198 lb)     Height 06/03/24 0637 1.575 m (5' 2)     Head Circumference --      Peak Flow --      Pain Score 06/03/24 0637 0     Pain Loc --      Pain Education --      Exclude from Growth Chart --     Most recent vital signs: Vitals:   06/03/24 0642  BP: 133/78  Pulse: 84  Resp: 16  Temp: 98.3 F (36.8 C)  SpO2: 100%     General: Awake, no distress.  CV:  Good peripheral perfusion.  Resp:  Normal effort.  Abd:  No distention.  Other:  Well-appearing, no acute distress.  Cranial nerves II through XII normal, normal neurologic exam.  PERRLA, EOMI No rash to suggest shingles   ED Results / Procedures / Treatments   Labs (all labs ordered are listed, but only abnormal results are displayed) Labs Reviewed - No data to display   EKG     RADIOLOGY CT head viewed by me, no acute abnormality    PROCEDURES:  Critical Care performed:    Procedures   MEDICATIONS ORDERED IN ED: Medications - No data to display   IMPRESSION / MDM / ASSESSMENT AND PLAN / ED COURSE  I reviewed the triage vital signs and the nursing notes. Patient's presentation is most consistent with acute illness / injury with system symptoms.  Patient presents with headache as detailed above, overall well-appearing with no red flag symptoms.  Suspicious for tension type headache.  She has been concerned about intracranial abnormality, given time course will send for CT head.  Is reassuring, patient remains asymptomatic, feeling well.  No indication for admission or further imaging at this time, she will follow-up with her PCP, strict return precautions, she agrees to this plan.      FINAL CLINICAL IMPRESSION(S) / ED DIAGNOSES   Final diagnoses:  Acute non intractable tension-type headache     Rx / DC Orders   ED Discharge Orders          Ordered    naproxen  (NAPROSYN ) 500 MG tablet  2  times daily with meals        06/03/24 0800             Note:  This document was prepared using Dragon voice recognition software and may include unintentional dictation errors.   Arlander Charleston, MD 06/03/24 [phone]

## 2024-06-03 NOTE — ED Triage Notes (Addendum)
 Patient ambulatory to triage with steady gait, without difficulty or distress noted; pt reports seen by PCP recently for occipital HA that radiates into sides of head that began 3wks ago; denies any hx of same; rx muslce relaxer, nurtec and tylenol  with relief but HA cont to reoccur; tylenol  taken at 4am with relief currently; pt reports that she is wanting a scan but has been unable to get her PCP to order one

## 2024-06-12 ENCOUNTER — Other Ambulatory Visit: Payer: Self-pay | Admitting: Internal Medicine

## 2024-06-12 DIAGNOSIS — E1169 Type 2 diabetes mellitus with other specified complication: Secondary | ICD-10-CM

## 2024-06-12 DIAGNOSIS — E1129 Type 2 diabetes mellitus with other diabetic kidney complication: Secondary | ICD-10-CM

## 2024-06-15 NOTE — Telephone Encounter (Signed)
 Requested Prescriptions  Pending Prescriptions Disp Refills   Semaglutide , 1 MG/DOSE, (OZEMPIC , 1 MG/DOSE,) 4 MG/3ML SOPN [Pharmacy Med Name: OZEMPIC  4 MG/3 ML (1 MG/DOSE)] 9 mL 0    Sig: INJECT 1 MG INTO THE SKIN ONE TIME PER WEEK     Endocrinology:  Diabetes - GLP-1 Receptor Agonists - semaglutide  Failed - 06/15/2024  4:05 PM      Failed - HBA1C in normal range and within 180 days    Hemoglobin A1C  Date Value Ref Range Status  05/07/2024 6.0 (A) 4.0 - 5.6 % Final   HbA1c, POC (controlled diabetic range)  Date Value Ref Range Status  02/01/2020 6.2 0.0 - 7.0 % Final   Hgb A1c MFr Bld  Date Value Ref Range Status  12/31/2022 6.2 (H) 4.8 - 5.6 % Final    Comment:    (NOTE)         Prediabetes: 5.7 - 6.4         Diabetes: >6.4         Glycemic control for adults with diabetes: <7.0          Passed - Cr in normal range and within 360 days    Creat  Date Value Ref Range Status  05/14/2024 1.00 0.50 - 1.05 mg/dL Final   Creatinine, Urine  Date Value Ref Range Status  05/07/2024 55 20 - 275 mg/dL Final         Passed - Valid encounter within last 6 months    Recent Outpatient Visits           2 weeks ago Tension headache   [place] [facility] Glenard Mire, MD   1 month ago Left upper quadrant abdominal pain   [facility] Gareth Mliss FALCON, FNP   1 month ago Controlled type 2 diabetes mellitus with microalbuminuria, without long-term current use of insulin [facility])   [place] [facility] Glenard Mire, MD   5 months ago Controlled type 2 diabetes mellitus with microalbuminuria, without long-term current use of insulin [facility])   [facility] Glenard Mire, MD       Future Appointments             In 1 month Glenard, Krichna, MD [facility]   In 2 months Sowles, Krichna, MD [facility]

## 2024-07-22 NOTE — Patient Instructions (Incomplete)
 Preventive Care 83 Years and Older, Female Preventive care refers to lifestyle choices and visits with your health care provider that can promote health and wellness. Preventive care visits are also called wellness exams. What can I expect for my preventive care visit? Counseling Your health care provider may ask you questions about your: Medical history, including: Past medical problems. Family medical history. Pregnancy and menstrual history. History of falls. Current health, including: Memory and ability to understand (cognition). Emotional well-being. Home life and relationship well-being. Sexual activity and sexual health. Lifestyle, including: Alcohol, nicotine or tobacco, and drug use. Access to firearms. Diet, exercise, and sleep habits. Work and work Astronomer. Sunscreen use. Safety issues such as seatbelt and bike helmet use. Physical exam Your health care provider will check your: Height and weight. These may be used to calculate your BMI (body mass index). BMI is a measurement that tells if you are at a healthy weight. Waist circumference. This measures the distance around your waistline. This measurement also tells if you are at a healthy weight and may help predict your risk of certain diseases, such as type 2 diabetes and high blood pressure. Heart rate and blood pressure. Body temperature. Skin for abnormal spots. What immunizations do I need?  Vaccines are usually given at various ages, according to a schedule. Your health care provider will recommend vaccines for you based on your age, medical history, and lifestyle or other factors, such as travel or where you work. What tests do I need? Screening Your health care provider may recommend screening tests for certain conditions. This may include: Lipid and cholesterol levels. Hepatitis C test. Hepatitis B test. HIV (human immunodeficiency virus) test. STI (sexually transmitted infection) testing, if you are at  risk. Lung cancer screening. Colorectal cancer screening. Diabetes screening. This is done by checking your blood sugar (glucose) after you have not eaten for a while (fasting). Mammogram. Talk with your health care provider about how often you should have regular mammograms. BRCA-related cancer screening. This may be done if you have a family history of breast, ovarian, tubal, or peritoneal cancers. Bone density scan. This is done to screen for osteoporosis. Talk with your health care provider about your test results, treatment options, and if necessary, the need for more tests. Follow these instructions at home: Eating and drinking  Eat a diet that includes fresh fruits and vegetables, whole grains, lean protein, and low-fat dairy products. Limit your intake of foods with high amounts of sugar, saturated fats, and salt. Take vitamin and mineral supplements as recommended by your health care provider. Do not drink alcohol if your health care provider tells you not to drink. If you drink alcohol: Limit how much you have to 0-1 drink a day. Know how much alcohol is in your drink. In the [place] one drink equals one 12 oz bottle of beer (355 mL), one 5 oz glass of wine (148 mL), or one 1 oz glass of hard liquor (44 mL). Lifestyle Brush your teeth every morning and night with fluoride toothpaste. Floss one time each day. Exercise for at least 30 minutes 5 or more days each week. Do not use any products that contain nicotine or tobacco. These products include cigarettes, chewing tobacco, and vaping devices, such as e-cigarettes. If you need help quitting, ask your health care provider. Do not use drugs. If you are sexually active, practice safe sex. Use a condom or other form of protection in order to prevent STIs. Take aspirin only as told by  your health care provider. Make sure that you understand how much to take and what form to take. Work with your health care provider to find out whether it  is safe and beneficial for you to take aspirin daily. Ask your health care provider if you need to take a cholesterol-lowering medicine (statin). Find healthy ways to manage stress, such as: Meditation, yoga, or listening to music. Journaling. Talking to a trusted person. Spending time with friends and family. Minimize exposure to UV radiation to reduce your risk of skin cancer. Safety Always wear your seat belt while driving or riding in a vehicle. Do not drive: If you have been drinking alcohol. Do not ride with someone who has been drinking. When you are tired or distracted. While texting. If you have been using any mind-altering substances or drugs. Wear a helmet and other protective equipment during sports activities. If you have firearms in your house, make sure you follow all gun safety procedures. What's next? Visit your health care provider once a year for an annual wellness visit. Ask your health care provider how often you should have your eyes and teeth checked. Stay up to date on all vaccines. This information is not intended to replace advice given to you by your health care provider. Make sure you discuss any questions you have with your health care provider. Document Revised: 04/12/2021 Document Reviewed: 04/12/2021 Elsevier Patient Education  2024 [employer].

## 2024-07-24 ENCOUNTER — Encounter: Admitting: Family Medicine

## 2024-07-30 ENCOUNTER — Encounter: Payer: Self-pay | Admitting: Family Medicine

## 2024-07-30 ENCOUNTER — Ambulatory Visit (INDEPENDENT_AMBULATORY_CARE_PROVIDER_SITE_OTHER): Admitting: Family Medicine

## 2024-07-30 VITALS — BP 130/74 | HR 91 | Resp 16 | Ht 62.0 in | Wt 207.2 lb

## 2024-07-30 DIAGNOSIS — Z1231 Encounter for screening mammogram for malignant neoplasm of breast: Secondary | ICD-10-CM | POA: Diagnosis not present

## 2024-07-30 DIAGNOSIS — E2839 Other primary ovarian failure: Secondary | ICD-10-CM

## 2024-07-30 DIAGNOSIS — E213 Hyperparathyroidism, unspecified: Secondary | ICD-10-CM

## 2024-07-30 DIAGNOSIS — Z23 Encounter for immunization: Secondary | ICD-10-CM

## 2024-07-30 DIAGNOSIS — Z01419 Encounter for gynecological examination (general) (routine) without abnormal findings: Secondary | ICD-10-CM

## 2024-07-30 NOTE — Progress Notes (Signed)
 Name: Kathy Price   MRN: [id number]    DOB: [birth year]   Date:07/30/2024       Progress Note  Subjective  Chief Complaint  Chief Complaint  Patient presents with   Annual Exam    HPI  Patient presents for annual CPE.  Diet: cooking at home, portion control, avoiding fried food  Exercise: not very active due to knee pain, seeing ortho   Last Eye Exam: completed Last Dental Exam: completed  Flowsheet Row Office Visit from 07/30/2024 in [facility]  AUDIT-C Score 0   Depression: Phq 9 is  negative    07/30/2024   10:19 AM 05/27/2024   12:57 PM 05/14/2024   12:48 PM 05/07/2024    8:08 AM 12/31/2023    9:41 AM  Depression screen PHQ 2/9  Decreased Interest 0 0 0 0 0  Down, Depressed, Hopeless 0 0 0 0 0  PHQ - 2 Score 0 0 0 0 0  Altered sleeping     0  Tired, decreased energy     0  Change in appetite     0  Feeling bad or failure about yourself      0  Trouble concentrating     0  Moving slowly or fidgety/restless     0  Suicidal thoughts     0  PHQ-9 Score     0  Difficult doing work/chores     Not difficult at all   Hypertension: BP Readings from Last 3 Encounters:  07/30/24 130/74  06/03/24 133/78  05/27/24 132/76   Obesity: Wt Readings from Last 3 Encounters:  07/30/24 207 lb 3.2 oz (94 kg)  06/03/24 198 lb (89.8 kg)  05/27/24 210 lb 12.8 oz (95.6 kg)   BMI Readings from Last 3 Encounters:  07/30/24 37.90 kg/m  06/03/24 36.21 kg/m  05/27/24 38.56 kg/m     Vaccines: flu shot today , reviewed with the patient.   Hep C Screening: completed STD testing and prevention (HIV/chl/gon/syphilis): N/A Intimate partner violence: negative screen  Sexual History : not sexually active since she become a widow in March 2025 Menstrual History/LMP/Abnormal Bleeding: s/p hysterectomy  Discussed importance of follow up if any post-menopausal bleeding: not applicable  Incontinence Symptoms: negative for symptoms   Breast cancer:  - Last  Mammogram: up to date  - BRCA gene screening: sister had breast cancer - she is not sure if she was genetic tested   Osteoporosis Prevention : Discussed high calcium  and vitamin D  supplementation, weight bearing exercises Bone density :yes   Cervical cancer screening: not applicable due to hysterectomy  Skin cancer: Discussed monitoring for atypical lesions  Colorectal cancer:   due for repeat in July 2026 Lung cancer:  Low Dose CT Chest recommended if Age 58-80 years, 20 pack-year currently smoking OR have quit w/in 15years. Patient does not qualify for screen   ECG: 2024  Advanced Care Planning: A voluntary discussion about advance care planning including the explanation and discussion of advance directives.  Discussed health care proxy and Living will, and the patient was able to identify a health care proxy as oldest daughter - Dorothe .  Patient does not have a living will and power of attorney of health care   Patient Active Problem List   Diagnosis Date Noted   History of parathyroidectomy 01/02/2023   Primary osteoarthritis of both knees 08/15/2022   Helicobacter pylori gastritis 11/06/2016   Ischemic dilated cardiomyopathy (HCC) 01/30/2016   CAD S/P  percutaneous coronary angioplasty 09/07/2015   Dyslipidemia associated with type 2 diabetes mellitus (HCC) 09/07/2015   Dyslipidemia 09/07/2015   H/O: hysterectomy 09/07/2015   H/O acute myocardial infarction 09/07/2015   Vitamin D  deficiency 09/07/2015   Morbid obesity (HCC) 09/07/2015   Bradycardia 05/13/2014   Arteriosclerosis of coronary artery 05/13/2014   Essential (primary) hypertension 05/13/2014   UARS (upper airway resistance syndrome) 01/23/2013    Past Surgical History:  Procedure Laterality Date   ABDOMINAL HYSTERECTOMY     COLONOSCOPY     COLONOSCOPY WITH PROPOFOL  N/A 05/09/2020   Procedure: COLONOSCOPY WITH PROPOFOL ;  Surgeon: Therisa Bi, MD;  Location: [facility] END[facility]COPY;  Service: Gastroenterology;  Laterality:  N/A;   CORONARY ANGIOPLASTY  2002   stents   ESOPHAGOGASTRODUODENOSCOPY (EGD) WITH PROPOFOL  N/A 11/05/2016   Procedure: ESOPHAGOGASTRODUODENOSCOPY (EGD) WITH PROPOFOL ;  Surgeon: Bi Therisa, MD;  Location: [facility] ENDOSCOPY;  Service: Endoscopy;  Laterality: N/A;   PARATHYROIDECTOMY N/A 01/01/2023   Procedure: LEFT SUPERIOR AND INFERIOR PARATHYROIDECTOMY;  Surgeon: Kinsinger, Herlene Righter, MD;  Location: [facility] ORS;  Service: General;  Laterality: N/A;    Family History  Problem Relation Age of Onset   Diabetes Mother    Hypertension Mother    Cancer Father    CAD Brother    Breast cancer Sister 67    Social History   Socioeconomic History   Marital status: Widowed    Spouse name: Sherwood   Number of children: 3   Years of education: Not on file   Highest education level: GED or equivalent  Occupational History   Occupation: retired  Tobacco Use   Smoking status: Former    Current packs/day: 0.00    Average packs/day: 1 pack/day for 25.0 years (25.0 ttl pk-yrs)    Types: Cigarettes    Start date: 09/07/1975    Quit date: 09/06/2000    Years since quitting: 23.9    Passive exposure: Current (husband smokes inside)   Smokeless tobacco: Never   Tobacco comments:    Smoking cessation materials not required  Vaping Use   Vaping status: Never Used  Substance and Sexual Activity   Alcohol use: No    Alcohol/week: 0.0 standard drinks of alcohol   Drug use: No   Sexual activity: Not Currently    Partners: Male  Other Topics Concern   Not on file  Social History Narrative   Not on file   Social Drivers of Health   Financial Resource Strain: Low Risk  (07/30/2024)   Overall Financial Resource Strain (CARDIA)    Difficulty of Paying Living Expenses: Not hard at all  Food Insecurity: No Food Insecurity (07/30/2024)   Hunger Vital Sign    Worried About Running Out of Food in the Last Year: Never true    Ran Out of Food in the Last Year: Never true  Transportation Needs: No Transportation  Needs (07/30/2024)   PRAPARE - Administrator, Civil Service (Medical): No    Lack of Transportation (Non-Medical): No  Physical Activity: Inactive (07/30/2024)   Exercise Vital Sign    Days of Exercise per Week: 0 days    Minutes of Exercise per Session: 0 min  Stress: No Stress Concern Present (07/30/2024)   [employer] of Occupational Health - Occupational Stress Questionnaire    Feeling of Stress: Only a little  Social Connections: Socially Integrated (07/30/2024)   Social Connection and Isolation Panel    Frequency of Communication with Friends and Family: More than three times a week  Frequency of Social Gatherings with Friends and Family: More than three times a week    Attends Religious Services: More than 4 times per year    Active Member of [employer] or Organizations: Yes    Attends Engineer, structural: More than 4 times per year    Marital Status: Married  Catering manager Violence: Not At Risk (07/30/2024)   Humiliation, Afraid, Rape, and Kick questionnaire    Fear of Current or Ex-Partner: No    Emotionally Abused: No    Physically Abused: No    Sexually Abused: No     Current Outpatient Medications:    Accu-Chek Softclix Lancets lancets, Use to check blood sugar once daily, Disp: 100 each, Rfl: 3   acetaminophen  (TYLENOL ) 500 MG tablet, Take 1,000 mg by mouth every 6 (six) hours as needed for moderate pain., Disp: , Rfl:    aspirin EC 81 MG tablet, Take 1 tablet by mouth daily., Disp: , Rfl:    atorvastatin  (LIPITOR) 80 MG tablet, Take 1 tablet (80 mg total) by mouth daily., Disp: 90 tablet, Rfl: 1   baclofen  (LIORESAL ) 10 MG tablet, Take 1 tablet (10 mg total) by mouth 3 (three) times daily as needed for muscle spasms., Disp: 30 each, Rfl: 0   Blood Glucose Monitoring Suppl (ACCU-CHEK GUIDE ME) w/Device KIT, Per insurance preference for coverage, Disp: 1 kit, Rfl: 0   carvedilol  (COREG ) 3.125 MG tablet, Take 1 tablet (3.125 mg total) by mouth 2  (two) times daily with a meal., Disp: 180 tablet, Rfl: 1   Cholecalciferol (VITAMIN D3) 2000 UNITS capsule, Take 1 capsule by mouth daily., Disp: , Rfl:    empagliflozin  (JARDIANCE ) 25 MG TABS tablet, Take 1 tablet (25 mg total) by mouth daily., Disp: 90 tablet, Rfl: 3   glucose blood (ACCU-CHEK GUIDE TEST) test strip, Use to check blood sugar once daily, Disp: 100 each, Rfl: 3   Multiple Vitamins-Minerals (WOMENS MULTIVITAMIN PO), Take 1 tablet by mouth. , Disp: , Rfl:    naproxen  (NAPROSYN ) 500 MG tablet, Take 1 tablet (500 mg total) by mouth 2 (two) times daily with a meal., Disp: 20 tablet, Rfl: 2   ondansetron  (ZOFRAN -ODT) 4 MG disintegrating tablet, Take 1 tablet (4 mg total) by mouth every 8 (eight) hours as needed for nausea or vomiting., Disp: 10 tablet, Rfl: 0   Rimegepant Sulfate (NURTEC) 75 MG TBDP, Take 1 tablet (75 mg total) by mouth every other day., Disp: 15 tablet, Rfl: 2   sacubitril -valsartan  (ENTRESTO ) 24-26 MG, Take 1 tablet by mouth 2 (two) times daily., Disp: , Rfl:    Semaglutide , 1 MG/DOSE, (OZEMPIC , 1 MG/DOSE,) 4 MG/3ML SOPN, INJECT 1 MG INTO THE SKIN ONE TIME PER WEEK, Disp: 9 mL, Rfl: 0   spironolactone  (ALDACTONE ) 25 MG tablet, Take 12.5 mg by mouth daily., Disp: , Rfl:   Allergies  Allergen Reactions   Metoprolol Other (See Comments)    severe bradycardia   Pantoprazole  Rash   Sulfa Antibiotics Rash and Hives     ROS  Constitutional: Negative for fever , positive for weight change.  Respiratory: Negative for cough and shortness of breath.   Cardiovascular: Negative for chest pain or palpitations.  Gastrointestinal: Negative for abdominal pain, no bowel changes.  Musculoskeletal: Negative for gait problem or joint swelling.  Skin: Negative for rash.  Neurological: Negative for dizziness or headache.  No other specific complaints in a complete review of systems (except as listed in HPI above).   Objective  Vitals:  07/30/24 1021  BP: 130/74  Pulse: 91   Resp: 16  SpO2: 100%  Weight: 207 lb 3.2 oz (94 kg)  Height: 5' 2 (1.575 m)    Body mass index is 37.9 kg/m.  Physical Exam  Constitutional: Patient appears well-developed and well-nourished. No distress.  HENT: Head: Normocephalic and atraumatic. Ears: B TMs ok, no erythema or effusion; Nose: Nose normal. Mouth/Throat: Oropharynx is clear and moist. No oropharyngeal exudate.  Eyes: Conjunctivae and EOM are normal. Pupils are equal, round, and reactive to light. No scleral icterus.  Neck: Normal range of motion. Neck supple. No JVD present. No thyromegaly present.  Cardiovascular: Normal rate, regular rhythm and normal heart sounds.  No murmur heard. No BLE edema. Pulmonary/Chest: Effort normal and breath sounds normal. No respiratory distress. Abdominal: Soft. Bowel sounds are normal, no distension. There is no tenderness. no masses Breast: no lumps or masses, no nipple discharge or rashes FEMALE GENITALIA:  Not done  RECTAL: not done  Musculoskeletal: Normal range of motion, no joint effusions. No gross deformities Neurological: he is alert and oriented to person, place, and time. No cranial nerve deficit. Coordination, balance, strength, speech and gait are normal.  Skin: Skin is warm and dry. No rash noted. No erythema.  Psychiatric: Patient has a normal mood and affect. behavior is normal. Judgment and thought content normal.     Assessment & Plan  1. Well woman exam (Primary)   2. Need for influenza vaccination  - Flu vaccine HIGH DOSE PF(Fluzone Trivalent)  3. Encounter for screening mammogram for malignant neoplasm of breast  - MM 3D SCREENING MAMMOGRAM BILATERAL BREAST; Future  4. Hyperparathyroidism  - DG Bone Density; Future  5. Ovarian failure  - DG Bone Density; Future   -USPSTF grade A and B recommendations reviewed with patient; age-appropriate recommendations, preventive care, screening tests, etc discussed and encouraged; healthy living  encouraged; see AVS for patient education given to patient -Discussed importance of 150 minutes of physical activity weekly, eat two servings of fish weekly, eat one serving of tree nuts ( cashews, pistachios, pecans, almonds.SABRA) every other day, eat 6 servings of fruit/vegetables daily and drink plenty of water and avoid sweet beverages.   -Reviewed Health Maintenance: Yes.

## 2024-08-07 ENCOUNTER — Other Ambulatory Visit: Payer: Self-pay | Admitting: Family Medicine

## 2024-08-07 DIAGNOSIS — E669 Obesity, unspecified: Secondary | ICD-10-CM

## 2024-08-10 ENCOUNTER — Encounter: Payer: Self-pay | Admitting: Pharmacist

## 2024-08-10 ENCOUNTER — Other Ambulatory Visit: Payer: Self-pay | Admitting: Pharmacist

## 2024-08-10 DIAGNOSIS — Z59868 Other specified financial insecurity: Secondary | ICD-10-CM

## 2024-08-10 NOTE — Patient Instructions (Signed)
 Novo Nordisk, the maker of Ozempic  and Rybelsus , has announced that they will no longer offer these medications to Medicare beneficiaries through their Patient Assistance Program in 2026.   This means that if you currently receive Ozempic  or Rybelsus  at no cost through the program, starting in January 2026 you'll need to use your insurance to continue on these medicines.    Choosing a Medicare Plan  With Medicare's Annual Enrollment Period starting on October 15th, we encourage you to review formulary coverage and copay amounts for Ozempic  or Rybelsus , as costs can vary widely between plans. Starting on Wednesday, October 1st you can compare your Medicare plan options by going to the Plan Finder tool at [employer].gov ( TeleconferenceOnDemand.fr ).   There are Medicare Specialists through the [employer] [facility] Insurance Information Program ([place]) that can help you shop for Medicare plans.   [place] [facility] toll free number: [phone] (Monday - Friday 8 am - 5 pm)  There are representatives located in each county:   Ign[facility] [address]     [place]  [address] [phone] Call for an appointment with [facility]      Maximum Out-of-Pocket and Prescription Payment Plan  In 2026, the maximum yearly out-of-pocket cost for medications is $2,100 for all Medicare beneficiaries. This means you will not have to pay more than $2,100 in total for all prescription medications filled on your Medicare plan in 2026. You may also choose to enroll in a Medicare Prescription Payment Plan through your chosen 2026 Medicare plan. This lets you spread out your prescription drug costs over the year instead of laying a large amount all at once at the pharmacy.   Medicare Extra Help  Some patients may qualify for a program called Medicare Extra Help that could help lower the cost of prescriptions on your chosen Medicare plan. If you meet the below income  criteria, please visit [email or web address] to apply, or respond to this message and one of our pharmacy team members can call you to help apply.    Your Household Size Annual income limit Resource Limit*  Individual [id number] $17,600  Married Couple [id number] $35,130   *Resources include:   -  Money in a checking, savings, or retirement account   -  Stocks   -  Bonds  Medicare Does NOT consider the following in your resource limit:   -  Your home   -  One car   -  Burial plot   -  Up to $1,500 for burial expenses if you have put that money aside   -  Furniture   Sharyle Sia, PharmD, [facility] [phone]

## 2024-08-10 NOTE — Progress Notes (Signed)
   08/10/2024  Patient ID: Kathy Price Level, female   DOB: 09/02/1956, 68 y.o.   MRN: [id number]  Patient enrolled in Ozempic  patient assistance program from Novo [employer]isk through 10/28/2024  [employer] has announced that they will no longer offer Ozempic  to [employer] beneficiaries through their Patient Assistance Program in 2026.   Outreach to patient today and provide this update.   Based on reported income, patient does not think that she meets criteria for Extra Help subsidy, but will check her finances to be sure  Counsel patient that Medicare's Annual Enrollment Period for 2026 starts 08/12/2024. Encourage patient to review deductibles formulary coverage and copay amounts (including for Ozempic ) between plans. Also share with patient the contact information for the [facility] Information Program [facility]) that can help with reviewing these Medicare plans Will also share this information with patient via MyChart message  Follow Up Plan: Schedule follow up for patient to speak with Clinical Pharmacist Peyton Ferries in person on 09/07/2024 at 10:30 AM   Sharyle Sia, PharmD, [facility] [phone]

## 2024-08-17 ENCOUNTER — Telehealth: Payer: Self-pay

## 2024-08-17 NOTE — Telephone Encounter (Signed)
 PAP renewal for bicares jardiance . Mailed out pt portion today and will fax provider portion

## 2024-08-19 ENCOUNTER — Other Ambulatory Visit: Admitting: Pharmacist

## 2024-08-21 ENCOUNTER — Telehealth: Payer: Self-pay

## 2024-08-21 NOTE — Telephone Encounter (Signed)
 Received a call from pt requesting a refill on Ozempic , Filled and faxed provider portion to sign and date and faxed back to [phone].

## 2024-08-27 ENCOUNTER — Other Ambulatory Visit (HOSPITAL_COMMUNITY): Payer: Self-pay

## 2024-08-27 NOTE — Telephone Encounter (Signed)
 Received provider portion [employer] Ozempic, faxed to [employer] today.

## 2024-09-02 ENCOUNTER — Other Ambulatory Visit (HOSPITAL_COMMUNITY): Payer: Self-pay

## 2024-09-03 ENCOUNTER — Telehealth: Payer: Self-pay

## 2024-09-03 NOTE — Telephone Encounter (Signed)
 Received provider portion of Bicares (Jardiance )  waiting on pt portion.

## 2024-09-04 ENCOUNTER — Other Ambulatory Visit (HOSPITAL_COMMUNITY): Payer: Self-pay

## 2024-09-04 NOTE — Telephone Encounter (Signed)
 Contacted patient to remind her to bring PAP paperwork to clinic for upcoming pharmacy appointment. Patient reports mailing out documents on Monday, 08/31/24.   Discussed that we will attempt to enroll in [facility] to assist with Entresto  copay. May discuss obtaining Jardiance  via grant vs PAP at that time.   Benicia Bergevin E. Marsh, PharmD, CPP Clinical Pharmacist [facility] [phone]

## 2024-09-04 NOTE — Telephone Encounter (Signed)
 Pt has been enrolled in grant for Entresto  see grant details below:

## 2024-09-04 NOTE — Telephone Encounter (Addendum)
 Please disregard inquiry, submitting healthwell app for Entresto . Thanks

## 2024-09-04 NOTE — Telephone Encounter (Signed)
 Hey Allyson! The Cardiomyopathy grant is available rn for patients, I do see a previous diagnosis in her chart, would you like me to try to get enrolled?

## 2024-09-04 NOTE — Telephone Encounter (Signed)
 The cardiomyopathy [employer] grant actually offers coverage for Jardiance  and Entresto , happy to apply for either, but want to make sure that I'm applying for whichever is preferred for pt. Please let me know which medication, what strength, and dosing when able. Thanks!

## 2024-09-04 NOTE — Telephone Encounter (Addendum)
 [facility] pharmacy billing information is as follows:

## 2024-09-07 ENCOUNTER — Ambulatory Visit

## 2024-09-07 ENCOUNTER — Ambulatory Visit: Admitting: Family Medicine

## 2024-09-07 ENCOUNTER — Encounter: Payer: Self-pay | Admitting: Family Medicine

## 2024-09-07 VITALS — BP 130/74 | HR 93 | Resp 16 | Ht 62.0 in | Wt 205.1 lb

## 2024-09-07 DIAGNOSIS — Z9089 Acquired absence of other organs: Secondary | ICD-10-CM

## 2024-09-07 DIAGNOSIS — E1129 Type 2 diabetes mellitus with other diabetic kidney complication: Secondary | ICD-10-CM

## 2024-09-07 DIAGNOSIS — Z9889 Other specified postprocedural states: Secondary | ICD-10-CM

## 2024-09-07 DIAGNOSIS — E119 Type 2 diabetes mellitus without complications: Secondary | ICD-10-CM

## 2024-09-07 DIAGNOSIS — I420 Dilated cardiomyopathy: Secondary | ICD-10-CM

## 2024-09-07 DIAGNOSIS — E1169 Type 2 diabetes mellitus with other specified complication: Secondary | ICD-10-CM

## 2024-09-07 DIAGNOSIS — E785 Hyperlipidemia, unspecified: Secondary | ICD-10-CM

## 2024-09-07 DIAGNOSIS — I255 Ischemic cardiomyopathy: Secondary | ICD-10-CM | POA: Diagnosis not present

## 2024-09-07 DIAGNOSIS — G43009 Migraine without aura, not intractable, without status migrainosus: Secondary | ICD-10-CM

## 2024-09-07 DIAGNOSIS — Z7985 Long-term (current) use of injectable non-insulin antidiabetic drugs: Secondary | ICD-10-CM

## 2024-09-07 DIAGNOSIS — Z9861 Coronary angioplasty status: Secondary | ICD-10-CM

## 2024-09-07 DIAGNOSIS — G44209 Tension-type headache, unspecified, not intractable: Secondary | ICD-10-CM

## 2024-09-07 DIAGNOSIS — R809 Proteinuria, unspecified: Secondary | ICD-10-CM

## 2024-09-07 DIAGNOSIS — I2510 Atherosclerotic heart disease of native coronary artery without angina pectoris: Secondary | ICD-10-CM

## 2024-09-07 DIAGNOSIS — E669 Obesity, unspecified: Secondary | ICD-10-CM

## 2024-09-07 LAB — POCT GLYCOSYLATED HEMOGLOBIN (HGB A1C): Hemoglobin A1C: 5.8 % — AB (ref 4.0–5.6)

## 2024-09-07 MED ORDER — NORTRIPTYLINE HCL 10 MG PO CAPS: 10.0000 mg | ORAL_CAPSULE | Freq: Every day | ORAL | 1 refills | Status: AC

## 2024-09-07 MED ORDER — OZEMPIC (1 MG/DOSE) 4 MG/3ML ~~LOC~~ SOPN: 1.0000 mg | PEN_INJECTOR | SUBCUTANEOUS | 1 refills | Status: AC

## 2024-09-07 NOTE — Progress Notes (Signed)
 S:     Reason for visit: ?  Kathy Price is a 68 y.o. female with a history of diabetes (type 2), who presents today for a follow up diabetes Face to Face pharmacotherapy visit.? 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   Care Team: Primary Care Provider: Sowles, Krichna, MD  Current diabetes medications include: Jardiance  25 mg daily, Ozempic  1 mg weekly  Previous diabetes medications include: metformin   Current hypertension medications include: carvedilol  3.125 mg BID, spironolactone  12.5 mg daily, Entresto  24/26 mg BID Current hyperlipidemia medications include: atorvastatin  80 mg daily   Patient reports adherence to taking all medications as prescribed. She uses a weekly AM/PM medication pill box  Have you been experiencing any side effects to the medications prescribed? no Do you have any problems obtaining medications due to transportation or finances? yes Insurance coverage: [facility] Medicare  Current medication access support:  [employer] for Ozempic  until 10/28/24 Cardiomyopathy [employer] Grant until 08/04/2025  Patient denies hypoglycemic events.  Reported home fasting blood sugars: not checking  Patient denies nocturia about 3 times per night Patient denies neuropathy (nerve pain). Patient denies visual changes. Patient denies self foot exams.   DM Prevention:  Statin: Taking; high intensity.?  ACE/ARB: yes; Entresto  History of chronic kidney disease? yes Last urinary albumin/creatinine ratio:  Lab Results  Component Value Date   MICRALBCREAT 15 05/07/2024   MICRALBCREAT 14 09/02/2023   MICRALBCREAT 104 (H) 08/15/2022   MICRALBCREAT 31 (H) 08/14/2021   MICRALBCREAT 11 07/17/2019   Last eye exam: 02/2024  Last foot exam: No foot exam found Tobacco Use:  Tobacco Use: Medium Risk (07/30/2024)   Patient History    Smoking Tobacco Use: Former    Smokeless Tobacco  Use: Never    Passive Exposure: Current   O:  Vitals:  Wt Readings from Last 3 Encounters:  07/30/24 207 lb 3.2 oz (94 kg)  06/03/24 198 lb (89.8 kg)  05/27/24 210 lb 12.8 oz (95.6 kg)   BP Readings from Last 3 Encounters:  07/30/24 130/74  06/03/24 133/78  05/27/24 132/76   Pulse Readings from Last 3 Encounters:  07/30/24 91  06/03/24 84  05/27/24 94     Labs:?  Lab Results  Component Value Date   HGBA1C 6.0 (A) 05/07/2024   HGBA1C 6.3 (A) 12/31/2023   HGBA1C 6.9 (A) 09/02/2023   GLUCOSE 123 (H) 05/14/2024   MICRALBCREAT 15 05/07/2024   MICRALBCREAT 14 09/02/2023   MICRALBCREAT 104 (H) 08/15/2022   CREATININE 1.00 05/14/2024   CREATININE 1.04 05/07/2024   CREATININE 0.96 09/02/2023    Lab Results  Component Value Date   CHOL 219 (H) 05/07/2024   LDLCALC 137 (H) 05/07/2024   LDLCALC 127 (H) 09/02/2023   LDLCALC 133 (H) 08/15/2022   HDL 52 05/07/2024   TRIG 161 (H) 05/07/2024   TRIG 189 (H) 09/02/2023   TRIG 108 08/15/2022   ALT 11 05/14/2024   ALT 12 05/07/2024   AST 12 05/14/2024   AST 14 05/07/2024      Chemistry      Component Value Date/Time   NA 140 05/14/2024 1326   NA 140 02/05/2015 1130   K 4.4 05/14/2024 1326   K 3.9 02/05/2015 1130   CL 106 05/14/2024 1326   CL 106 02/05/2015 1130   CO2 25 05/14/2024 1326   CO2 26 02/05/2015 1130   BUN 14 05/14/2024 1326  BUN 19 02/05/2015 1130   CREATININE 1.00 05/14/2024 1326      Component Value Date/Time   CALCIUM  11.2 (H) 05/14/2024 1326   CALCIUM  9.3 02/05/2015 1130   ALKPHOS 69 08/28/2016 1024   ALKPHOS 70 02/05/2015 1130   AST 12 05/14/2024 1326   AST 20 02/05/2015 1130   ALT 11 05/14/2024 1326   ALT 15 02/05/2015 1130   BILITOT 0.4 05/14/2024 1326   BILITOT 0.4 02/05/2015 1130       The ASCVD Risk score (Arnett DK, et al., 2019) failed to calculate for the following reasons:   Risk score cannot be calculated because patient has a medical history suggesting prior/existing  ASCVD  Lab Results  Component Value Date   MICRALBCREAT 15 05/07/2024   MICRALBCREAT 14 09/02/2023   MICRALBCREAT 104 (H) 08/15/2022   MICRALBCREAT 31 (H) 08/14/2021   MICRALBCREAT 11 07/17/2019    A/P: Diabetes currently controlled with a most recent A1c of 5.8% on 09/07/24. Medication adherence appears appropriate. [employer] will no longer be offering Ozempic  PAP for Medicare beneficiaries in 2026. Patient is currently eligible for Cardiomyopathy [employer] grant which should cover both Entresto  and Jardiance  copays, which might help patient meet her deductible in the new year. Refill pending with the company for Ozempic  at this time (4 month supply). -Continued GLP-1 Ozempic  (semaglutide ) 1 mg weekly.  -Continued SGLT2-I Jardiance  (empagliflozin ) 25 mg daily.  -Extensively discussed pathophysiology of diabetes, recommended lifestyle interventions, dietary effects on blood sugar control.  -Counseled on s/sx of and management of hypoglycemia.  -Contacted [facility] pharmacy to provide grant billing information.  ASCVD risk - secondary prevention in patient with diabetes. Last LDL is 137 mg/dL, not at goal of <44 mg/dL. However, patient had reported inconsistent statin adherence at that time. Medication adherence now appears optimal. Consider PCSK9 and/or ezetimibe therapy if remains elevated at next check.  -Continued atorvastatin  80 mg daily.   Patient verbalized understanding of treatment plan. Total time patient counseling 30 minutes.  Follow-up:  Pharmacist on 12/14/24 PCP clinic visit on 01/05/25  Peyton CHARLENA Ferries, PharmD, CPP Clinical Pharmacist [facility] [phone]

## 2024-09-07 NOTE — Progress Notes (Signed)
 Name: Kathy Price   MRN: [id number]    DOB: 1956-03-01   Date:09/07/2024       Progress Note  Subjective  Chief Complaint  Chief Complaint  Patient presents with   Medical Management of Chronic Issues   Discussed the use of AI scribe software for clinical note transcription with the patient, who gave verbal consent to proceed.  History of Present Illness Kathy Price is a 68 year old female with a history of migraines who presents with persistent headaches.  She has been experiencing persistent headaches since mid-June. Initially mild, the headaches have worsened over time, necessitating the use of Tylenol  every six hours. The headaches are described as dull and aching, affecting the entire head, without associated light or sound sensitivity or nausea. Unlike her typical migraines, these headaches lack a throbbing quality and do not require a dark room. They occur intermittently, sometimes lasting two to three days consecutively before subsiding. A CT scan of the head performed in August showed no acute abnormalities. She has tried Nurtec, which provided some relief but did not prevent the headaches from occurring.  She has a history of type 2 diabetes, dyslipidemia, and coronary artery disease. She is currently taking atorvastatin  for dyslipidemia and coronary artery disease, Jardiance  25 mg, and Ozempic  1 mg for diabetes management. Her A1c is 5.8%, and she has lost two pounds since October. Ozempic  helps curb her appetite, although she occasionally indulges in sweets. No excessive hunger, thirst, or frequent urination. She also has a history of microalbuminuria, which has normalized with her current medication regimen.  She has ischemic dilated cardiomyopathy and a history of an inferior myocardial infarction with a stent placed in the RCA in 2002. Her last echocardiogram in 2020 showed an ejection fraction of 35%. No chest pain, palpitations, or shortness of breath during daily  activities, although she experiences some shortness of breath with exertion, which resolves with rest. She is taking carvedilol , Entresto , spironolactone , and aspirin for her cardiac condition.  She has a history of morbid obesity with a BMI over 35, associated with diabetes, hypertension, and heart disease. Her blood pressure at home is typically around 120/70-75 mmHg, although it was slightly elevated at today's visit due to not taking her medication before the appointment. She monitors her blood pressure regularly at home.  She has a history of parathyroidectomy due to primary hyperparathyroidism, having had one gland removed. Her calcium  levels were elevated in July, prompting a follow-up appointment with her parathyroid  specialist. No cramps or reflux, although she occasionally takes over-the-counter Pepsid for acid reflux.    Patient Active Problem List   Diagnosis Date Noted   History of parathyroidectomy 01/02/2023   Primary osteoarthritis of both knees 08/15/2022   Helicobacter pylori gastritis 11/06/2016   Ischemic dilated cardiomyopathy (HCC) 01/30/2016   CAD S/P percutaneous coronary angioplasty 09/07/2015   Dyslipidemia associated with type 2 diabetes mellitus (HCC) 09/07/2015   Dyslipidemia 09/07/2015   H/O: hysterectomy 09/07/2015   H/O acute myocardial infarction 09/07/2015   Vitamin D  deficiency 09/07/2015   Morbid obesity (HCC) 09/07/2015   Bradycardia 05/13/2014   Arteriosclerosis of coronary artery 05/13/2014   Essential (primary) hypertension 05/13/2014   UARS (upper airway resistance syndrome) 01/23/2013    Past Surgical History:  Procedure Laterality Date   ABDOMINAL HYSTERECTOMY     COLONOSCOPY     COLONOSCOPY WITH PROPOFOL  N/A 05/09/2020   Procedure: COLONOSCOPY WITH PROPOFOL ;  Surgeon: Therisa Bi, MD;  Location: [facility] ENDOSCOPY;  Service: Gastroenterology;  Laterality: N/A;   CORONARY ANGIOPLASTY  2002   stents   ESOPHAGOGASTRODUODENOSCOPY (EGD) WITH  PROPOFOL  N/A 11/05/2016   Procedure: ESOPHAGOGASTRODUODENOSCOPY (EGD) WITH PROPOFOL ;  Surgeon: Ruel Kung, MD;  Location: [facility] ENDOSCOPY;  Service: Endoscopy;  Laterality: N/A;   PARATHYROIDECTOMY N/A 01/01/2023   Procedure: LEFT SUPERIOR AND INFERIOR PARATHYROIDECTOMY;  Surgeon: Kinsinger, Herlene Righter, MD;  Location: [facility] ORS;  Service: General;  Laterality: N/A;    Family History  Problem Relation Age of Onset   Diabetes Mother    Hypertension Mother    Cancer Father    CAD Brother    Breast cancer Sister 5    Social History   Tobacco Use   Smoking status: Former    Current packs/day: 0.00    Average packs/day: 1 pack/day for 25.0 years (25.0 ttl pk-yrs)    Types: Cigarettes    Start date: 09/07/1975    Quit date: 09/06/2000    Years since quitting: 24.0    Passive exposure: Current (husband smokes inside)   Smokeless tobacco: Never   Tobacco comments:    Smoking cessation materials not required  Substance Use Topics   Alcohol use: No    Alcohol/week: 0.0 standard drinks of alcohol     Current Outpatient Medications:    Accu-Chek Softclix Lancets lancets, Use to check blood sugar once daily, Disp: 100 each, Rfl: 3   acetaminophen  (TYLENOL ) 500 MG tablet, Take 1,000 mg by mouth every 6 (six) hours as needed for moderate pain., Disp: , Rfl:    aspirin EC 81 MG tablet, Take 1 tablet by mouth daily., Disp: , Rfl:    atorvastatin  (LIPITOR) 80 MG tablet, Take 1 tablet (80 mg total) by mouth daily., Disp: 90 tablet, Rfl: 1   baclofen  (LIORESAL ) 10 MG tablet, Take 1 tablet (10 mg total) by mouth 3 (three) times daily as needed for muscle spasms., Disp: 30 each, Rfl: 0   Blood Glucose Monitoring Suppl (ACCU-CHEK GUIDE ME) w/Device KIT, Per insurance preference for coverage, Disp: 1 kit, Rfl: 0   carvedilol  (COREG ) 3.125 MG tablet, Take 1 tablet (3.125 mg total) by mouth 2 (two) times daily with a meal., Disp: 180 tablet, Rfl: 1   Cholecalciferol (VITAMIN D3) 2000 UNITS capsule, Take 1  capsule by mouth daily., Disp: , Rfl:    glucose blood (ACCU-CHEK GUIDE TEST) test strip, Use to check blood sugar once daily, Disp: 100 each, Rfl: 3   JARDIANCE  25 MG TABS tablet, TAKE ONE TABLET BY MOUTH DAILY, Disp: 90 tablet, Rfl: 2   Multiple Vitamins-Minerals (WOMENS MULTIVITAMIN PO), Take 1 tablet by mouth. , Disp: , Rfl:    naproxen  (NAPROSYN ) 500 MG tablet, Take 1 tablet (500 mg total) by mouth 2 (two) times daily with a meal., Disp: 20 tablet, Rfl: 2   ondansetron  (ZOFRAN -ODT) 4 MG disintegrating tablet, Take 1 tablet (4 mg total) by mouth every 8 (eight) hours as needed for nausea or vomiting., Disp: 10 tablet, Rfl: 0   Rimegepant Sulfate (NURTEC) 75 MG TBDP, Take 1 tablet (75 mg total) by mouth every other day., Disp: 15 tablet, Rfl: 2   sacubitril -valsartan  (ENTRESTO ) 24-26 MG, Take 1 tablet by mouth 2 (two) times daily., Disp: , Rfl:    Semaglutide , 1 MG/DOSE, (OZEMPIC , 1 MG/DOSE,) 4 MG/3ML SOPN, INJECT 1 MG INTO THE SKIN ONE TIME PER WEEK, Disp: 9 mL, Rfl: 0   spironolactone  (ALDACTONE ) 25 MG tablet, Take 12.5 mg by mouth daily., Disp: , Rfl:   Allergies  Allergen Reactions  Metoprolol Other (See Comments)    severe bradycardia   Pantoprazole  Rash   Sulfa Antibiotics Rash and Hives    I personally reviewed active problem list, medication list, allergies, family history with the patient/caregiver today.   ROS  Ten systems reviewed and is negative except as mentioned in HPI    Objective Physical Exam VITALS: BP- 130/74 MEASUREMENTS: BMI- 35.0. CONSTITUTIONAL: Patient appears well-developed and well-nourished.  No distress. HEENT: Head atraumatic, normocephalic, neck supple. CARDIOVASCULAR: Normal rate, regular rhythm and normal heart sounds.  No murmur heard. No BLE edema. PULMONARY: Effort normal and breath sounds normal. No respiratory distress. ABDOMINAL: There is no tenderness or distention. MUSCULOSKELETAL: Normal gait. Without gross motor or sensory  deficit. PSYCHIATRIC: Patient has a normal mood and affect. behavior is normal. Judgment and thought content normal.  Vitals:   09/07/24 0937  BP: 130/74  Pulse: 93  Resp: 16  SpO2: 96%  Weight: 205 lb 1.6 oz (93 kg)  Height: 5' 2 (1.575 m)    Body mass index is 37.51 kg/m.  Recent Results (from the past 2160 hours)  POCT glycosylated hemoglobin (Hb A1C)     Status: Abnormal   Collection Time: 09/07/24  9:42 AM  Result Value Ref Range   Hemoglobin A1C 5.8 (A) 4.0 - 5.6 %   HbA1c POC (<> result, manual entry)     HbA1c, POC (prediabetic range)     HbA1c, POC (controlled diabetic range)      Diabetic Foot Exam:     PHQ2/9:    09/07/2024    9:32 AM 07/30/2024   10:19 AM 05/27/2024   12:57 PM 05/14/2024   12:48 PM 05/07/2024    8:08 AM  Depression screen PHQ 2/9  Decreased Interest 0 0 0 0 0  Down, Depressed, Hopeless 0 0 0 0 0  PHQ - 2 Score 0 0 0 0 0    phq 9 is negative  Fall Risk:    09/07/2024    9:32 AM 07/30/2024   10:19 AM 05/27/2024   12:57 PM 05/14/2024   12:48 PM 05/07/2024    8:08 AM  Fall Risk   Falls in the past year? 0 0 0 0 0  Number falls in past yr: 0 0 0 0 0  Injury with Fall? 0 0 0 0 0  Risk for fall due to : No Fall Risks No Fall Risks No Fall Risks No Fall Risks No Fall Risks  Follow up Falls evaluation completed Falls evaluation completed Falls evaluation completed Falls evaluation completed Falls evaluation completed      Assessment & Plan Chronic headache syndrome (migraine and tension-type) Persistent headaches, relieved by Nurtec but not prevented. CT scan showed low acute abnormality. Differential includes tension-type headaches. - Prescribed nortriptyline 10 mg at night. - Continue Nurtec as needed. - Advised on nortriptyline side effects: constipation, dry mouth, confusion. - Consider neurologist referral if persistent.  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 Microalbuminuria normalized. Jardiance  and Ozempic  effective. - Continue Jardiance  and Ozempic . - Refilled Ozempic  prescription.  Morbid obesity BMI >35 with diabetes, hypertension, heart disease. BP slightly elevated but within target at home. - Continue cardiovascular medications. - Monitor blood pressure at home.  Ischemic cardiomyopathy and atherosclerotic heart disease of native coronary artery History of inferior MI, RCA stent, EF 35% in 2020. - Continue cardiac medications. - Follow up with cardiologist.

## 2024-10-08 ENCOUNTER — Ambulatory Visit: Payer: Self-pay

## 2024-10-08 VITALS — BP 142/78 | Ht 62.0 in | Wt 204.5 lb

## 2024-10-08 DIAGNOSIS — Z0000 Encounter for general adult medical examination without abnormal findings: Secondary | ICD-10-CM | POA: Diagnosis not present

## 2024-10-08 NOTE — Progress Notes (Signed)
 Chief Complaint  Patient presents with   Medicare Wellness     Subjective:   Kathy Price is a 68 y.o. female who presents for a Medicare Annual Wellness Visit.  Visit info / Clinical Intake: Living arrangements:: (Patient-Rptd) with family/others Patient's Overall Health Status Rating: (Patient-Rptd) good Typical amount of pain: (Patient-Rptd) some Does pain affect daily life?: (Patient-Rptd) no  Dietary Habits and Nutritional Risks How many meals a day?: (Patient-Rptd) 3 Eats fruit and vegetables daily?: (Patient-Rptd) yes Most meals are obtained by: (Patient-Rptd) preparing own meals  Functional Status Activities of Daily Living (to include ambulation/medication): (Patient-Rptd) Independent Ambulation: (Patient-Rptd) Independent Medication Administration: (Patient-Rptd) Independent Home Management (perform basic housework or laundry): (Patient-Rptd) Independent Manage your own finances?: (Patient-Rptd) yes Primary transportation is: (Patient-Rptd) driving  Fall Screening Falls in the past year?: (Patient-Rptd) 0 Number of falls in past year: 0 Was there an injury with Fall?: 0 Fall Risk Category Calculator: 0 Patient Fall Risk Level: Low Fall Risk  Fall Risk Patient at Risk for Falls Due to: No Fall Risks Fall risk Follow up: Falls evaluation completed  Home and Transportation Safety: All rugs have non-skid backing?: (Patient-Rptd) yes All stairs or steps have railings?: (Patient-Rptd) N/A, no stairs Grab bars in the bathtub or shower?: (!) (Patient-Rptd) no Have non-skid surface in bathtub or shower?: (Patient-Rptd) yes Good home lighting?: (Patient-Rptd) yes Regular seat belt use?: (Patient-Rptd) yes Hospital stays in the last year:: (Patient-Rptd) no  Cognitive Assessment Difficulty concentrating, remembering, or making decisions? : (Patient-Rptd) no  Advance Directives (For Healthcare) Does Patient Have a Medical Advance Directive?: No Would patient  like information on creating a medical advance directive?: No - Patient declined    Allergies (verified) Metoprolol, Pantoprazole , and Sulfa antibiotics   Current Medications (verified) Outpatient Encounter Medications as of 10/08/2024  Medication Sig   Accu-Chek Softclix Lancets lancets Use to check blood sugar once daily   acetaminophen  (TYLENOL ) 500 MG tablet Take 1,000 mg by mouth every 6 (six) hours as needed for moderate pain.   aspirin EC 81 MG tablet Take 1 tablet by mouth daily.   atorvastatin  (LIPITOR) 80 MG tablet Take 1 tablet (80 mg total) by mouth daily.   Blood Glucose Monitoring Suppl (ACCU-CHEK GUIDE ME) w/Device KIT Per insurance preference for coverage   carvedilol  (COREG ) 3.125 MG tablet Take 1 tablet (3.125 mg total) by mouth 2 (two) times daily with a meal.   Cholecalciferol (VITAMIN D3) 2000 UNITS capsule Take 1 capsule by mouth daily.   glucose blood (ACCU-CHEK GUIDE TEST) test strip Use to check blood sugar once daily   JARDIANCE  25 MG TABS tablet TAKE ONE TABLET BY MOUTH DAILY   Multiple Vitamins-Minerals (WOMENS MULTIVITAMIN PO) Take 1 tablet by mouth.    sacubitril -valsartan  (ENTRESTO ) 24-26 MG Take 1 tablet by mouth 2 (two) times daily.   Semaglutide , 1 MG/DOSE, (OZEMPIC , 1 MG/DOSE,) 4 MG/3ML SOPN Inject 1 mg into the skin once a week.   spironolactone  (ALDACTONE ) 25 MG tablet Take 12.5 mg by mouth daily.   nortriptyline  (PAMELOR ) 10 MG capsule Take 1 capsule (10 mg total) by mouth at bedtime. (Patient not taking: Reported on 09/07/2024)   Rimegepant Sulfate (NURTEC) 75 MG TBDP Take 1 tablet (75 mg total) by mouth every other day. (Patient not taking: Reported on 09/07/2024)   No facility-administered encounter medications on file as of 10/08/2024.    History: Past Medical History:  Diagnosis Date   Arthritis    Back pain    Breathing-related sleep  disorder    CAD (coronary artery disease)    Diabetes mellitus without complication (HCC)    History of  MI (myocardial infarction)    Hyperlipidemia    Hypertension    Myocardial infarction (HCC)    Obese    Postablative ovarian failure    Vitamin D  deficiency    Past Surgical History:  Procedure Laterality Date   ABDOMINAL HYSTERECTOMY     COLONOSCOPY     COLONOSCOPY WITH PROPOFOL  N/A 05/09/2020   Procedure: COLONOSCOPY WITH PROPOFOL ;  Surgeon: Therisa Bi, MD;  Location: [facility] ENDOSCOPY;  Service: Gastroenterology;  Laterality: N/A;   CORONARY ANGIOPLASTY  2002   stents   ESOPHAGOGASTRODUODENOSCOPY (EGD) WITH PROPOFOL  N/A 11/05/2016   Procedure: ESOPHAGOGASTRODUODENOSCOPY (EGD) WITH PROPOFOL ;  Surgeon: Bi Therisa, MD;  Location: [facility] ENDOSCOPY;  Service: Endoscopy;  Laterality: N/A;   PARATHYROIDECTOMY N/A 01/01/2023   Procedure: LEFT SUPERIOR AND INFERIOR PARATHYROIDECTOMY;  Surgeon: Kinsinger, Herlene Righter, MD;  Location: [facility] ORS;  Service: General;  Laterality: N/A;   Family History  Problem Relation Age of Onset   Diabetes Mother    Hypertension Mother    Cancer Father    CAD Brother    Breast cancer Sister 29   Social History   Occupational History   Occupation: retired  Tobacco Use   Smoking status: Former    Current packs/day: 0.00    Average packs/day: 1 pack/day for 25.0 years (25.0 ttl pk-yrs)    Types: Cigarettes    Start date: 09/07/1975    Quit date: 09/06/2000    Years since quitting: 24.1    Passive exposure: Current (husband smokes inside)   Smokeless tobacco: Never   Tobacco comments:    Smoking cessation materials not required  Vaping Use   Vaping status: Never Used  Substance and Sexual Activity   Alcohol use: No    Alcohol/week: 0.0 standard drinks of alcohol   Drug use: No   Sexual activity: Not Currently    Partners: Male   Tobacco Counseling Counseling given: Not Answered Tobacco comments: Smoking cessation materials not required  SDOH Screenings   Food Insecurity: No Food Insecurity (10/07/2024)  Housing: Unknown (10/07/2024)  Transportation  Needs: No Transportation Needs (10/07/2024)  Utilities: Not At Risk (07/30/2024)  Alcohol Screen: Low Risk (07/30/2024)  Depression (PHQ2-9): Low Risk (09/07/2024)  Financial Resource Strain: Low Risk (10/07/2024)  Physical Activity: Inactive (10/07/2024)  Social Connections: Moderately Isolated (10/07/2024)  Stress: No Stress Concern Present (10/07/2024)  Tobacco Use: Medium Risk (10/08/2024)  Health Literacy: Adequate Health Literacy (07/30/2024)   See flowsheets for full screening details  Depression Screen PHQ 2 & 9 Depression Scale- Over the past 2 weeks, how often have you been bothered by any of the following problems? Little interest or pleasure in doing things: 0 Feeling down, depressed, or hopeless (PHQ Adolescent also includes...irritable): 0 PHQ-2 Total Score: 0 Trouble falling or staying asleep, or sleeping too much: 0 Feeling tired or having little energy: 0 Poor appetite or overeating (PHQ Adolescent also includes...weight loss): 0 Feeling bad about yourself - or that you are a failure or have let yourself or your family down: 0 Trouble concentrating on things, such as reading the newspaper or watching television (PHQ Adolescent also includes...like school work): 0 Moving or speaking so slowly that other people could have noticed. Or the opposite - being so fidgety or restless that you have been moving around a lot more than usual: 0 Thoughts that you would be better off dead, or of hurting  yourself in some way: 0 PHQ-9 Total Score: 0 If you checked off any problems, how difficult have these problems made it for you to do your work, take care of things at home, or get along with other people?: Not difficult at all     Goals Addressed             This Visit's Progress    DIET - EAT MORE FRUITS AND VEGETABLES               Objective:    Today's Vitals   10/08/24 1330  BP: (!) 142/78  Weight: 204 lb 8 oz (92.8 kg)  Height: 5' 2 (1.575 m)   Body mass index  is 37.4 kg/m.  Hearing/Vision screen No results found. Immunizations and Health Maintenance Health Maintenance  Topic Date Due   COVID-19 Vaccine (3 - 2025-26 season) 06/29/2024   Zoster Vaccines- Shingrix  (1 of 2) 12/08/2024 (Originally 01/24/2005)   OPHTHALMOLOGY EXAM  03/05/2025   HEMOGLOBIN A1C  03/07/2025   Diabetic kidney evaluation - Urine ACR  05/07/2025   FOOT EXAM  05/07/2025   Colonoscopy  05/09/2025   Diabetic kidney evaluation - eGFR measurement  05/14/2025   Medicare Annual Wellness (AWV)  10/08/2025   Mammogram  04/27/2026   DTaP/Tdap/Td (4 - Td or Tdap) 11/15/2030   Pneumococcal Vaccine: 50+ Years  Completed   Influenza Vaccine  Completed   Bone Density Scan  Completed   Hepatitis C Screening  Completed   Meningococcal B Vaccine  Aged Out        Assessment/Plan:  This is a routine wellness examination for Arlenne.  Patient Care Team: Sowles, Krichna, MD as PCP - General (Family Medicine) Hester Wolm PARAS, MD as Consulting Physician (Cardiology) Marsh, Allyson E, RPH-CPP as Pharmacist  I have personally reviewed and noted the following in the patients chart:   Medical and social history Use of alcohol, tobacco or illicit drugs  Current medications and supplements including opioid prescriptions. Functional ability and status Nutritional status Physical activity Advanced directives List of other physicians Hospitalizations, surgeries, and ER visits in previous 12 months Vitals Screenings to include cognitive, depression, and falls Referrals and appointments  No orders of the defined types were placed in this encounter.  In addition, I have reviewed and discussed with patient certain preventive protocols, quality metrics, and best practice recommendations. A written personalized care plan for preventive services as well as general preventive health recommendations were provided to patient.   Jhonnie GORMAN Das, LPN   [id number]   Return in 1 year (on  10/08/2025).  After Visit Summary: (In Person-Declined) Patient declined AVS at this time.  Nurse Notes: UTD ON SHOTS EXCEPT SHINGRIX ; UTD ON MAMMOGRAM, FOOT EXAM, COLONOSCOPY & BDS

## 2024-10-08 NOTE — Patient Instructions (Addendum)
 Ms. Farabee,  Thank you for taking the time for your Medicare Wellness Visit. I appreciate your continued commitment to your health goals. Please review the care plan we discussed, and feel free to reach out if I can assist you further.  Please note that Annual Wellness Visits do not include a physical exam. Some assessments may be limited, especially if the visit was conducted virtually. If needed, we may recommend an in-person follow-up with your provider.  Ongoing Care Seeing your primary care provider every 3 to 6 months helps us  monitor your health and provide consistent, personalized care.   Referrals If a referral was made during today's visit and you haven't received any updates within two weeks, please contact the referred provider directly to check on the status.  Recommended Screenings:  Health Maintenance  Topic Date Due   COVID-19 Vaccine (3 - 2025-26 season) 06/29/2024   Zoster (Shingles) Vaccine (1 of 2) 12/08/2024*   Eye exam for diabetics  03/05/2025   Hemoglobin A1C  03/07/2025   Breast Cancer Screening  04/27/2025   Yearly kidney health urinalysis for diabetes  05/07/2025   Complete foot exam   05/07/2025   Colon Cancer Screening  05/09/2025   Yearly kidney function blood test for diabetes  05/14/2025   Medicare Annual Wellness Visit  10/08/2025   Osteoporosis screening with Bone Density Scan  09/29/2027   DTaP/Tdap/Td vaccine (4 - Td or Tdap) 11/15/2030   Pneumococcal Vaccine for age over 49  Completed   Flu Shot  Completed   Hepatitis C Screening  Completed   Meningitis B Vaccine  Aged Out  *Topic was postponed. The date shown is not the original due date.     Vision: Annual vision screenings are recommended for early detection of glaucoma, cataracts, and diabetic retinopathy. These exams can also reveal signs of chronic conditions such as diabetes and high blood pressure.  Dental: Annual dental screenings help detect early signs of oral cancer, gum disease,  and other conditions linked to overall health, including heart disease and diabetes.  Please see the attached documents for additional preventive care recommendations.   NEXT AWV 10/14/25 @ 1:40 PM IN PERSON

## 2024-10-09 ENCOUNTER — Ambulatory Visit: Payer: Self-pay

## 2024-10-09 NOTE — Telephone Encounter (Signed)
 FYI Only or Action Required?: FYI only for provider: Eczema around eyes.  Patient was last seen in primary care on 09/07/2024 by Glenard Mire, MD.  Called Nurse Triage reporting Rash.  Symptoms began several days ago.  Interventions attempted: Rest, hydration, or home remedies and Ice/heat application.  Symptoms are: stable.  Triage Disposition: Home Care  Patient/caregiver understands and will follow disposition?: Yes  Copied from CRM #[id number]. Topic: Clinical - Red Word Triage >> Oct 09, 2024  1:09 PM Macario HERO wrote: Red Word that prompted transfer to Nurse Triage: Patient said she has eczema around her eyes and would like a prescription for it because it itches bad. Reason for Disposition  Mild localized rash  Answer Assessment - Initial Assessment Questions Patient with assumed eczema around both eyes. Patches of dry skin. She has eczema in other area and is consistent. Has been applying vaseline with cocoa butter for relief. Itches 5/10 when she has nothing on the area. Denies pain or vision changes. Denies fever, swelling, or red eye.   Advised Zyrtec or Benadryl  for itching. Keeping area moist. Warm/cool compress. Pt aware not to put anything close to eye.  If not resolving or worsens, pt will call back  1. APPEARANCE of RASH: What does the rash look like? (e.g., blisters, dry flaky skin, red spots, redness, sores)     Patch of dry skin  2. LOCATION: Where is the rash located?      Both eyes 3. NUMBER: How many spots are there?      More of patches but around both eyes 4. SIZE: How big are the spots? (e.g., inches, cm; or compare to size of pinhead, tip of pen, eraser, pea)      About quarter size or bigger- between eyebrow and the bottom of eye lids  5. ONSET: When did the rash start?      2-3 days  6. ITCHING: Does the rash itch? If Yes, ask: How bad is the itch?  (Scale 0-10; or none, mild, moderate, severe)     5/10- only itches with nothing on it 7.  PAIN: Does the rash hurt? If Yes, ask: How bad is the pain?  (Scale 0-10; or none, mild, moderate, severe)     Denies pain  8. OTHER SYMPTOMS: Do you have any other symptoms? (e.g., fever)     Denies fever  Protocols used: Rash or Redness - Localized-A-AH

## 2024-10-13 ENCOUNTER — Encounter: Payer: Self-pay | Admitting: Dermatology

## 2024-10-13 ENCOUNTER — Ambulatory Visit: Admitting: Dermatology

## 2024-10-13 DIAGNOSIS — L209 Atopic dermatitis, unspecified: Secondary | ICD-10-CM

## 2024-10-13 MED ORDER — HYDROCORTISONE 2.5 % EX CREA: TOPICAL_CREAM | CUTANEOUS | 1 refills | Status: AC

## 2024-10-13 NOTE — Progress Notes (Signed)
° °  New Patient Visit   Subjective  Kathy Price is a 68 y.o. female who presents for the following: Rash  At face around eyes, started Saturday, itches. Using Vaseline and started [facility] eye drops. Nothing new patient is using. Patient does have hx eczema. Patient normally uses a [facility]S lotion with baby oil and Vaseline but has used that for years.  The following portions of the chart were reviewed this encounter and updated as appropriate: medications, allergies, medical history  Review of Systems:  No other skin or systemic complaints except as noted in HPI or Assessment and Plan.  Objective  Well appearing patient in no apparent distress; mood and affect are within normal limits.  A focused examination was performed of the following areas: face  Relevant exam findings are noted in the Assessment and Plan.    Assessment & Plan   ATOPIC DERMATITIS, UNSPECIFIED TYPE    Atopic dermatitis with possible allergic contact component Exam: hyperpigmented scaly edematous plaques of b/l upper lower eyelids. Scaly patch perioral  Chronic and persistent condition with duration or expected duration over one year. Condition is bothersome/symptomatic for patient. Currently flared.  Atopic dermatitis (eczema) is a chronic, relapsing, pruritic condition that can significantly affect quality of life. It is often associated with allergic rhinitis and/or asthma and can require treatment with topical medications, phototherapy, or in severe cases biologic injectable medication (Dupixent, Adbry, Ebglyss) or oral JAK inhibitors (Rinvoq, Cibinqo).   Treatment Plan: Avoid nail painting Stop current face products, risk of allergy even if previously tolerated Stop optional eyedrops until improved Recommend hypoallergenic products - Cetaphil and Vanicream samples given to patient. Start HC 2.5% cream once daily until improved then stop. Avoid applying to face, groin, and axilla. Use as directed. Long-term  use can cause thinning of the skin. Expect up to a week of use before improvement Patient advised if not improved by 2 weeks she should let us  know via MyChart. May send pictures.   Return if symptoms worsen or fail to improve.  Kathy Price, RMA, am acting as scribe for Boneta Sharps, MD .   Documentation: I have reviewed the above documentation for accuracy and completeness, and I agree with the above.  Boneta Sharps, MD

## 2024-10-13 NOTE — Patient Instructions (Signed)

## 2024-11-04 ENCOUNTER — Other Ambulatory Visit: Payer: Self-pay | Admitting: Family Medicine

## 2024-11-04 DIAGNOSIS — I2510 Atherosclerotic heart disease of native coronary artery without angina pectoris: Secondary | ICD-10-CM

## 2024-11-04 DIAGNOSIS — I420 Dilated cardiomyopathy: Secondary | ICD-10-CM

## 2024-11-04 DIAGNOSIS — I10 Essential (primary) hypertension: Secondary | ICD-10-CM

## 2024-11-05 ENCOUNTER — Telehealth: Payer: Self-pay

## 2024-11-05 NOTE — Telephone Encounter (Signed)
 Copied from CRM [id number]. Topic: Clinical - Prescription Issue >> Nov 05, 2024  9:02 AM Jasmin G wrote: Reason for CRM: Pt requested for Dr. Joette team to contact [employer] so prescription refill for sacubitril -valsartan  (ENTRESTO ) 24-26 MG can get approved.

## 2024-11-05 NOTE — Telephone Encounter (Signed)
 Requested Prescriptions  Pending Prescriptions Disp Refills   carvedilol  (COREG ) 3.125 MG tablet [Pharmacy Med Name: CARVEDILOL  3.125 MG TABLET] 180 tablet 1    Sig: TAKE 1 TABLET BY MOUTH TWICE A DAY WITH A MEAL     Cardiovascular: Beta Blockers 3 Failed - 11/05/2024 11:59 AM      Failed - Last BP in normal range    BP Readings from Last 1 Encounters:  10/08/24 (!) 142/78         Passed - Cr in normal range and within 360 days    Creat  Date Value Ref Range Status  05/14/2024 1.00 0.50 - 1.05 mg/dL Final   Creatinine, Urine  Date Value Ref Range Status  05/07/2024 55 20 - 275 mg/dL Final         Passed - AST in normal range and within 360 days    AST  Date Value Ref Range Status  05/14/2024 12 10 - 35 U/L Final   SGOT(AST)  Date Value Ref Range Status  02/05/2015 20 U/L Final    Comment:    15-41 NOTE: New Reference Range  01/04/15          Passed - ALT in normal range and within 360 days    ALT  Date Value Ref Range Status  05/14/2024 11 6 - 29 U/L Final   SGPT (ALT)  Date Value Ref Range Status  02/05/2015 15 U/L Final    Comment:    14-54 NOTE: New Reference Range  01/04/15          Passed - Last Heart Rate in normal range    Pulse Readings from Last 1 Encounters:  09/07/24 93         Passed - Valid encounter within last 6 months    Recent Outpatient Visits           1 month ago Dyslipidemia associated with type 2 diabetes mellitus [facility])   [facility] Glenard Mire, MD   3 months ago Well woman exam   [facility] Glenard Mire, MD   5 months ago Tension headache   [facility] Glenard Mire, MD   5 months ago Left upper quadrant abdominal pain   [facility] Gareth Mliss FALCON, FNP   6 months ago Controlled type 2 diabetes mellitus with microalbuminuria, without long-term current use of insulin [facility]   [facility] Glenard Mire, MD

## 2024-12-01 ENCOUNTER — Ambulatory Visit: Admitting: Dermatology

## 2024-12-14 ENCOUNTER — Ambulatory Visit

## 2024-12-15 ENCOUNTER — Ambulatory Visit: Admitting: Dermatology

## 2025-01-05 ENCOUNTER — Ambulatory Visit: Admitting: Family Medicine

## 2025-08-03 ENCOUNTER — Encounter: Admitting: Family Medicine

## 2025-10-14 ENCOUNTER — Ambulatory Visit
